# Patient Record
Sex: Male | Born: 1970 | Race: White | Hispanic: Yes | Marital: Single
Health system: Midwestern US, Academic
[De-identification: ages and names within clinical notes are randomized; demographics above are authoritative.]

---

## 2017-03-29 IMAGING — CR CHEST
2 series · 2 of 2 positions shown · non-contrast
Comparison: none

[chest pa x-wise]
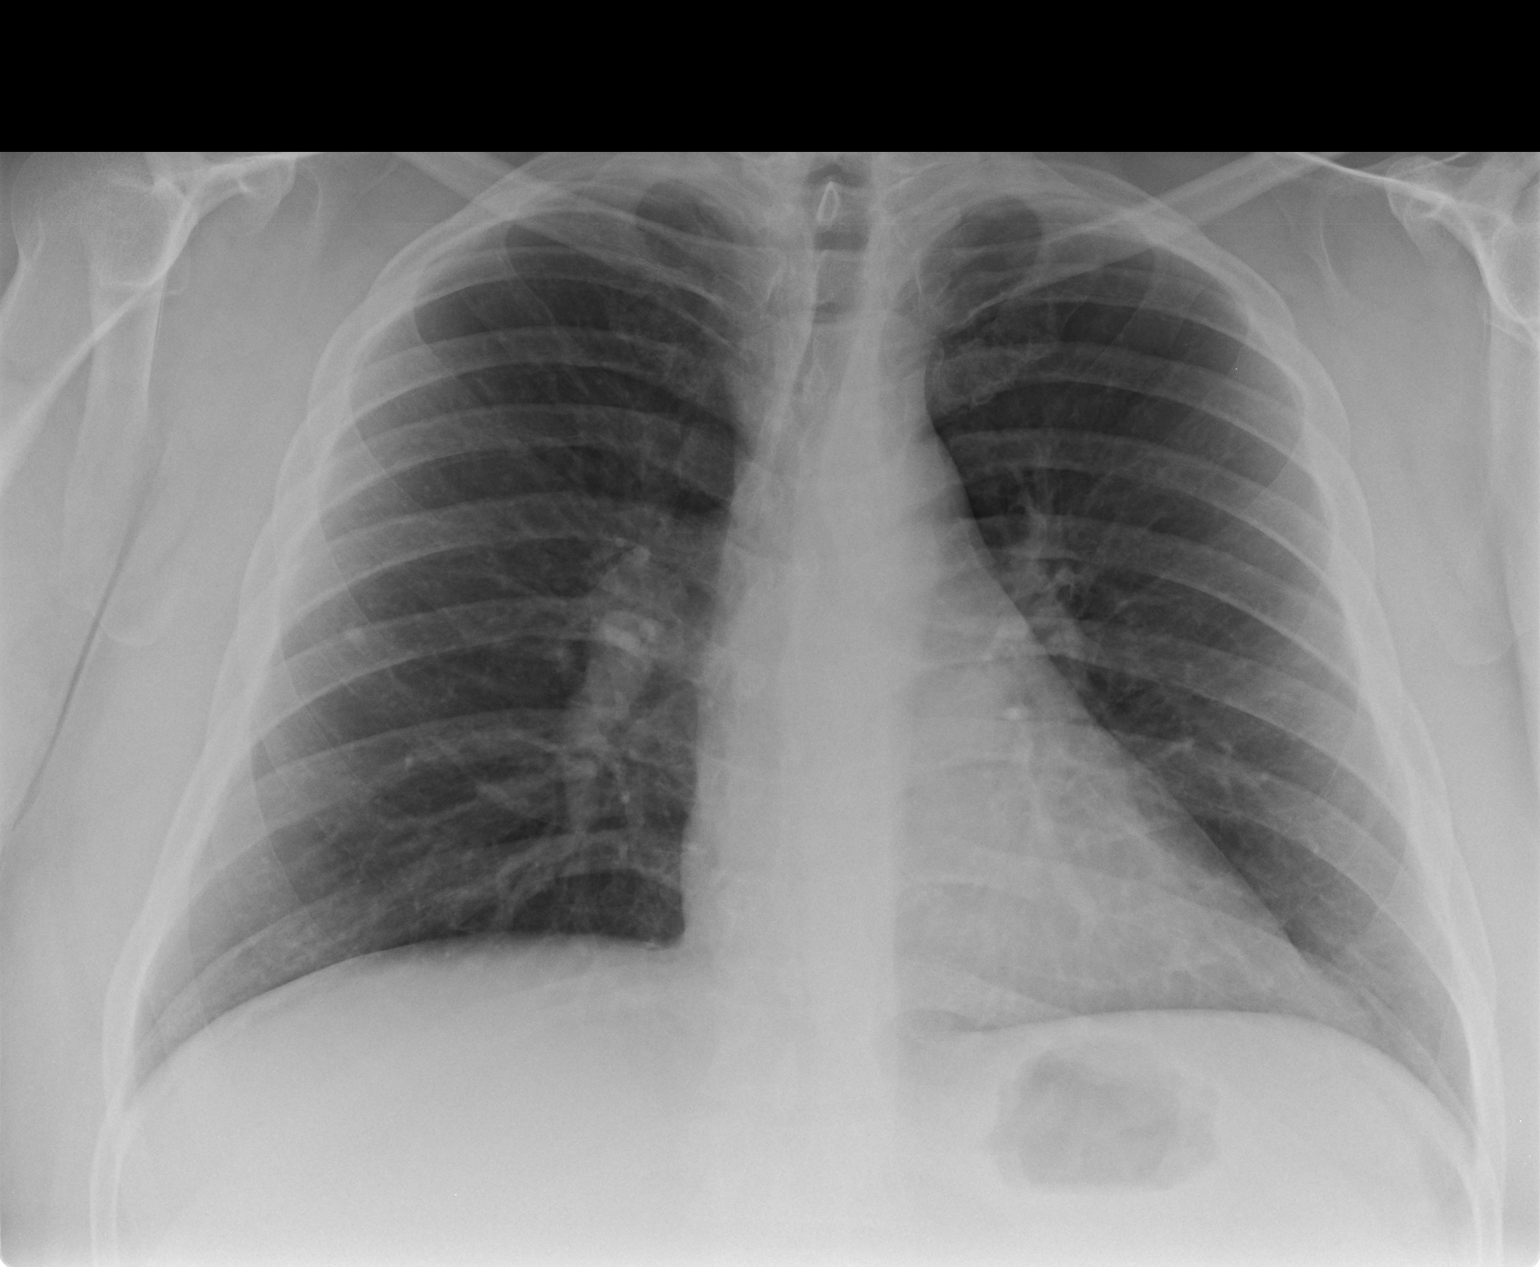

[chest lat]
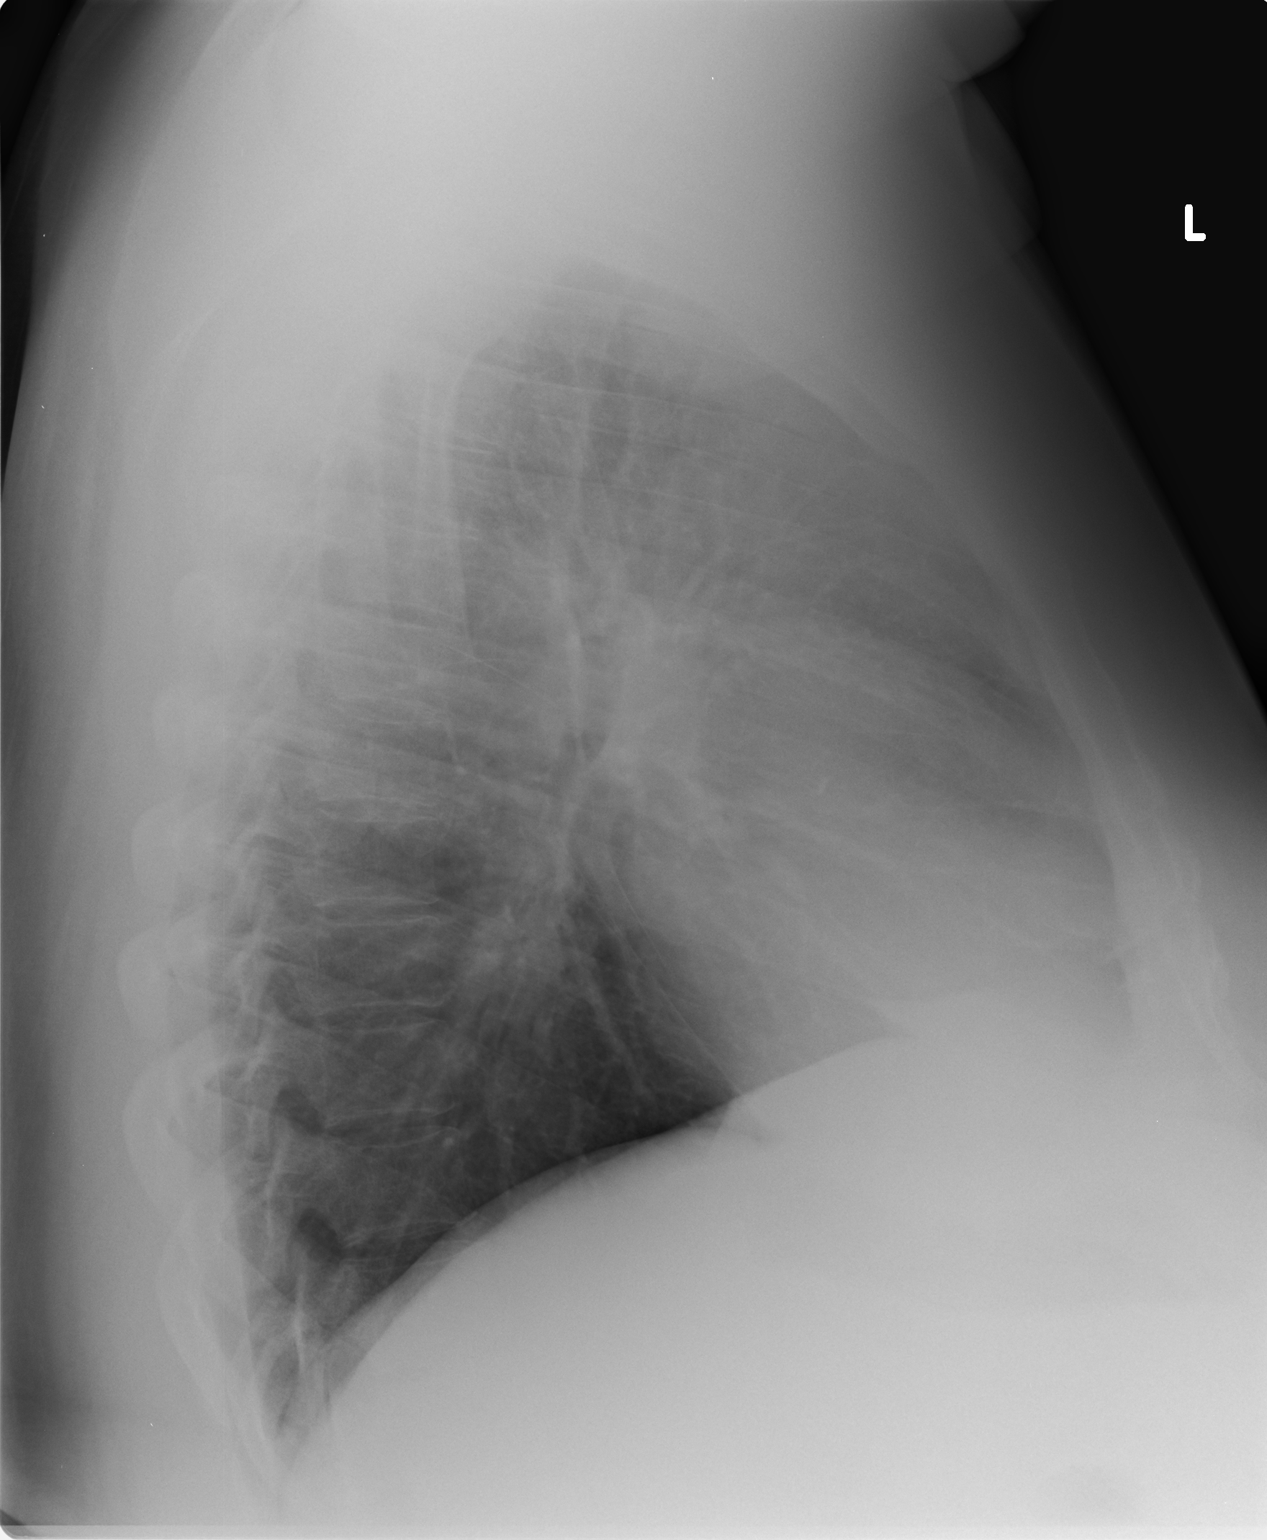

[2 of 2 positions shown; findings below may reference images not displayed]

DIAGNOSTIC STUDIES

EXAM

RADIOLOGICAL EXAMINATION, CHEST; 2 VIEWS FRONTAL AND LATERAL CPT 99383

INDICATION

SOB, wheezing
COUGH. CK

TECHNIQUE

PA and lateral view chest

COMPARISONS

No prior studies are available for comparison.

FINDINGS

There is no focal consolidation, effusion, or pneumothorax. Cardiac silhouette is within normal
limits. The bony thorax is intact.

IMPRESSION

No acute cardiopulmonary abnormality.

## 2017-05-21 IMAGING — CR CHEST
2 series · 2 of 2 positions shown · non-contrast
Comparison: none

[chest pa x-wise]
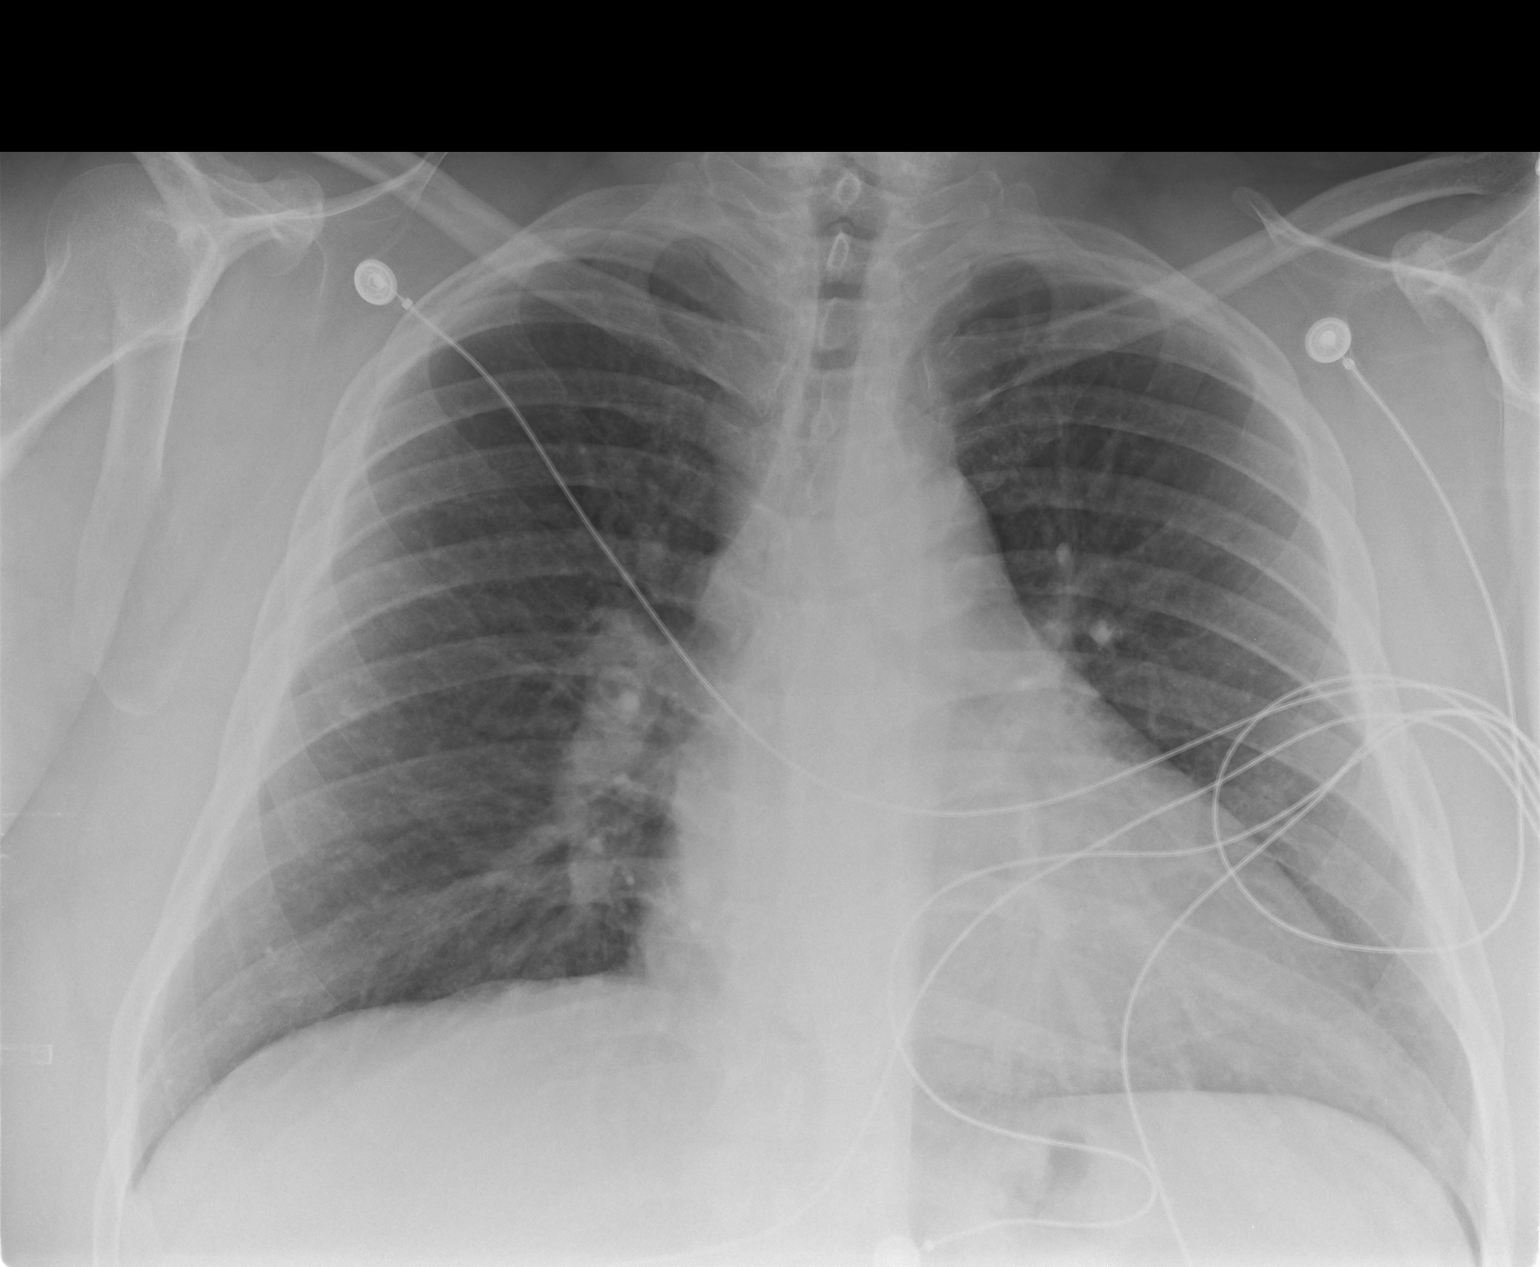

[chest lat]
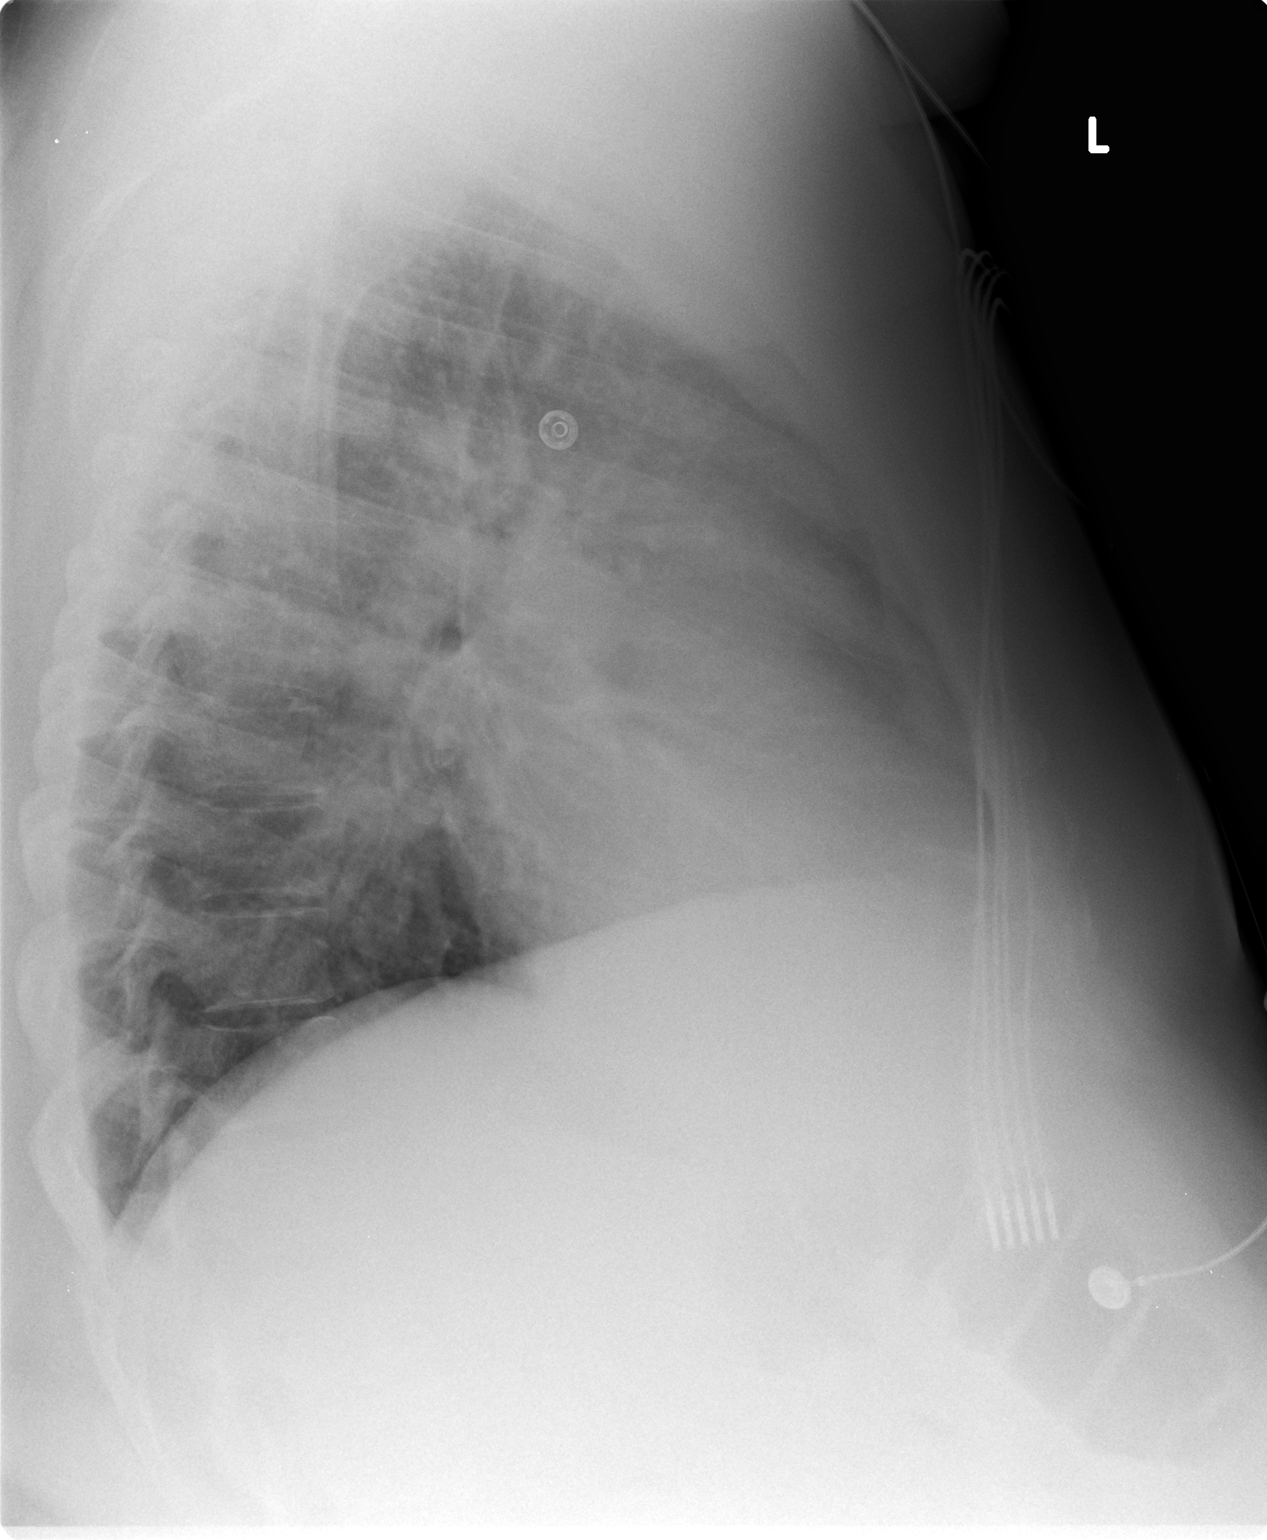

[2 of 2 positions shown; findings below may reference images not displayed]

EXAM
RADIOLOGICAL EXAMINATION, CHEST; 2 VIEWS FRONTAL AND LATERAL CPT 78252

INDICATION
Cough, fever. Mid epigastric pain and nausea. Dry heaves x 2 days.

TECHNIQUE
2 views of the chest were acquired.

COMPARISONS
Previous examination dated 03/29/2017.

FINDINGS
The cardiac silhouette is within normal limits. The lungs are clear. The pulmonary vasculature is
normal in caliber.

IMPRESSION
No acute cardiopulmonary process. Stable appearance of the chest.

Tech Notes:

fever, mid epigastric pain, nausea, dry heaves, x 2 days.

## 2017-05-21 IMAGING — CT Abdomen^1_ABDOMEN_PELVIS_WITH (Adult)
1 series · 15 of 32 positions shown, 19 images · IV contrast (APPLIED)
Comparison: none

[Series 2: abd/pelvis with 5.0 soft tissue · axial · 0.98mm/px · z∈[-480,-55]mm · 15 of 96 slices shown, 19 images]
[im 7/96  soft-tissue]
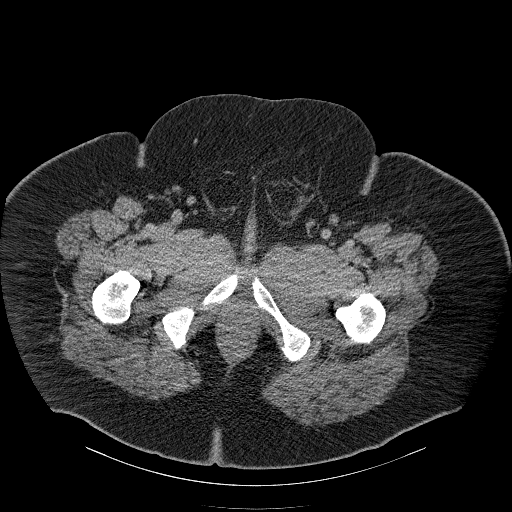
[im 7/96  bone]
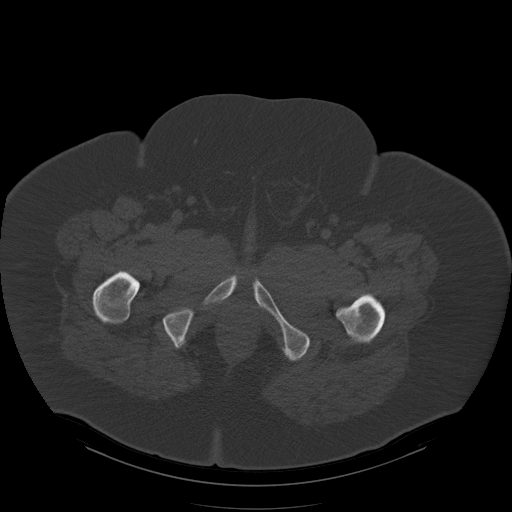
[im 13/96  soft-tissue]
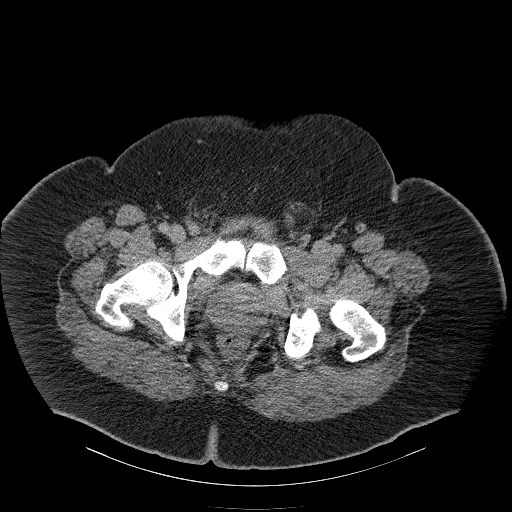
[im 19/96  soft-tissue]
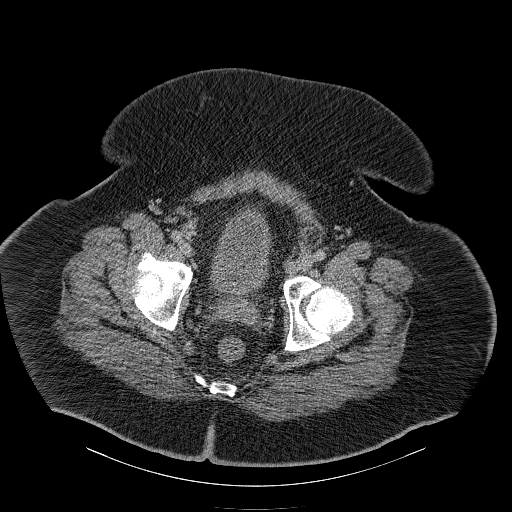
[im 28/96  soft-tissue]
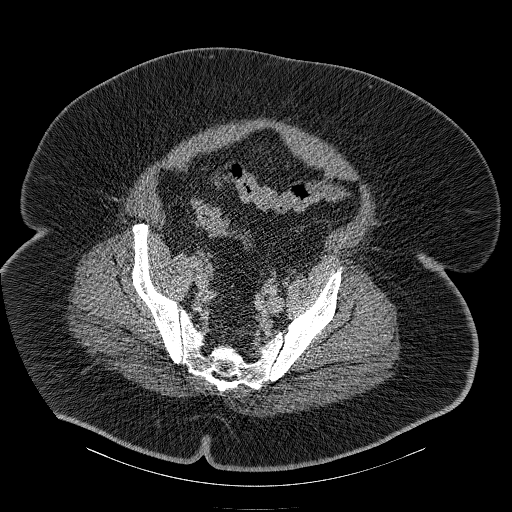
[im 34/96  soft-tissue]
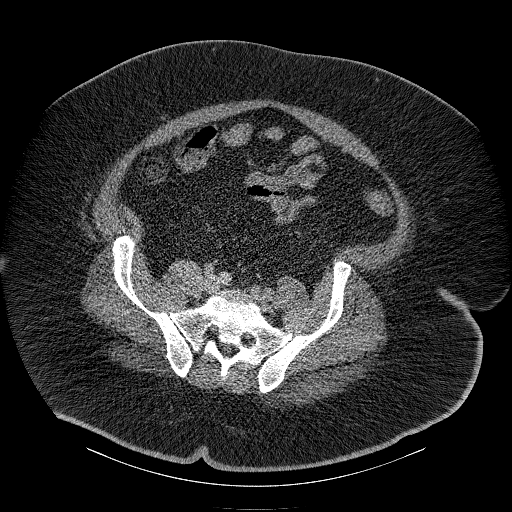
[im 40/96  soft-tissue]
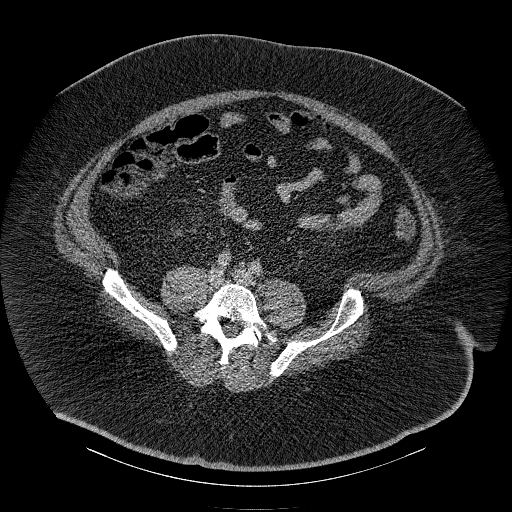
[im 50/96  soft-tissue]
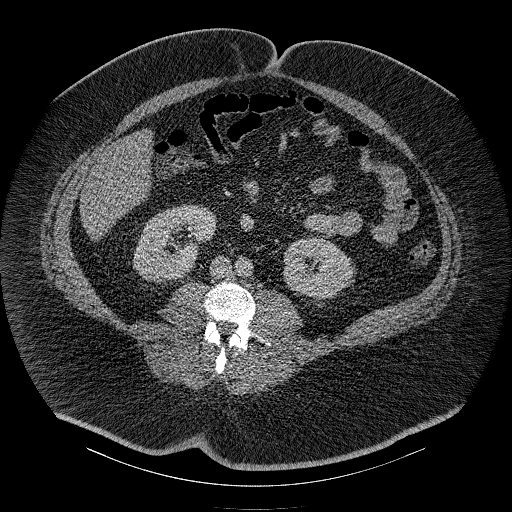
[im 56/96  soft-tissue]
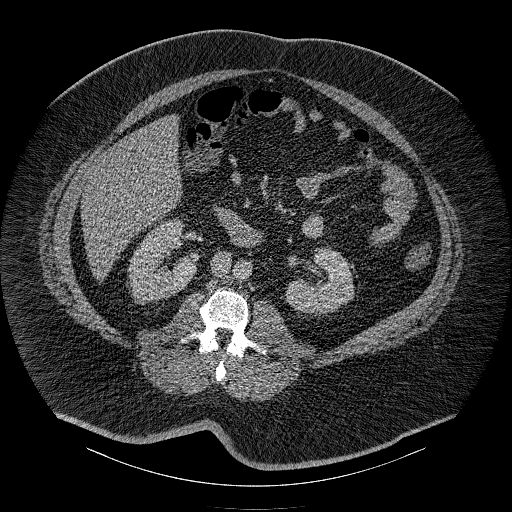
[im 62/96  soft-tissue]
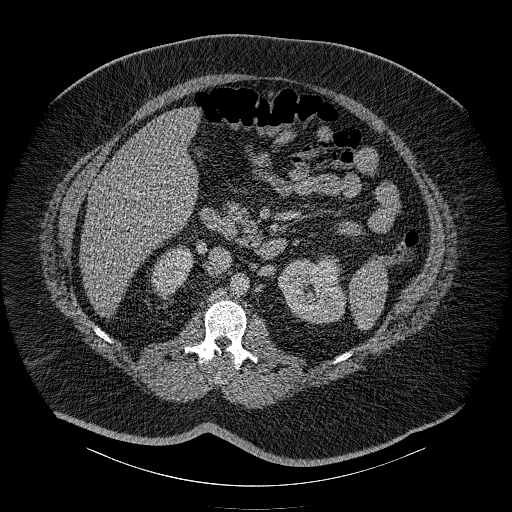
[im 62/96  bone]
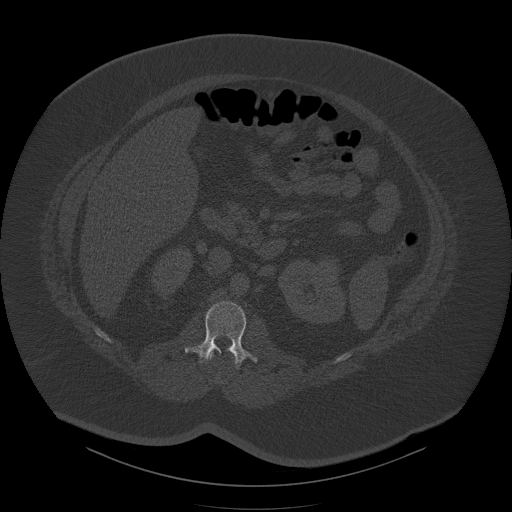
[im 68/96  soft-tissue]
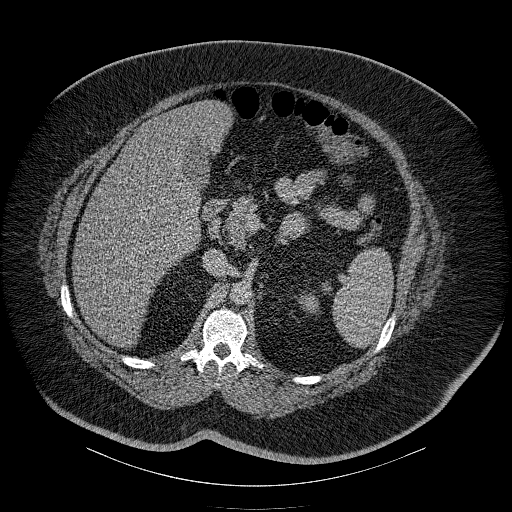
[im 77/96  soft-tissue]
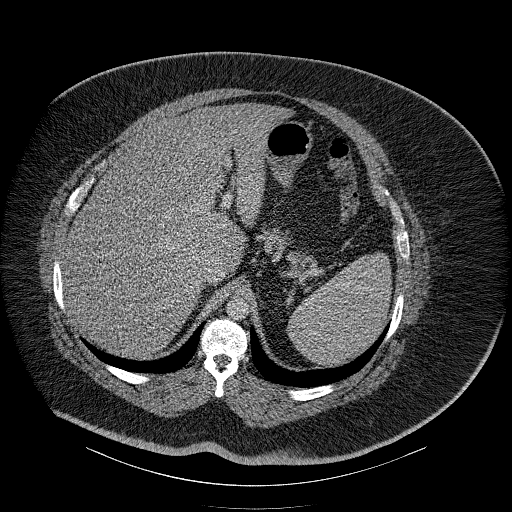
[im 83/96  soft-tissue]
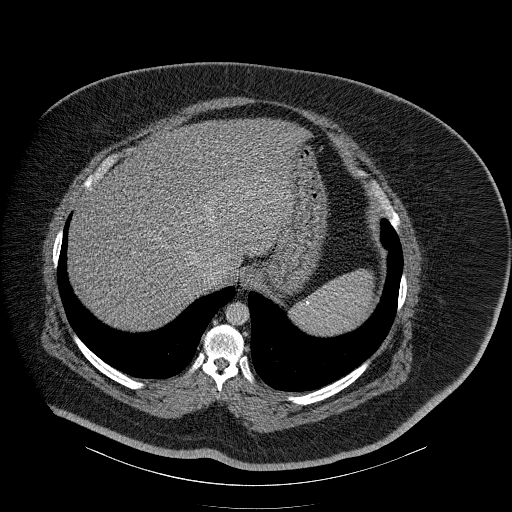
[im 83/96  lung]
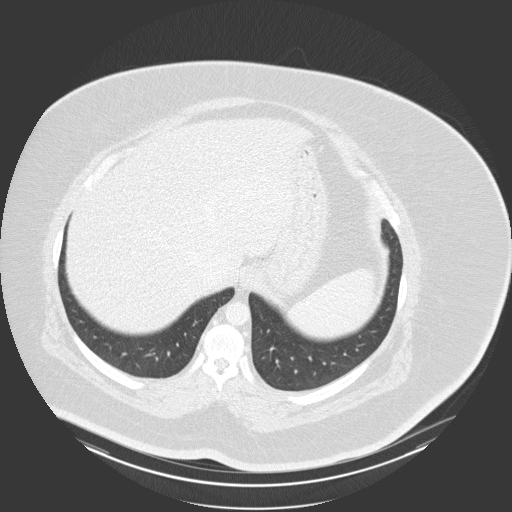
[im 86/96  lung]
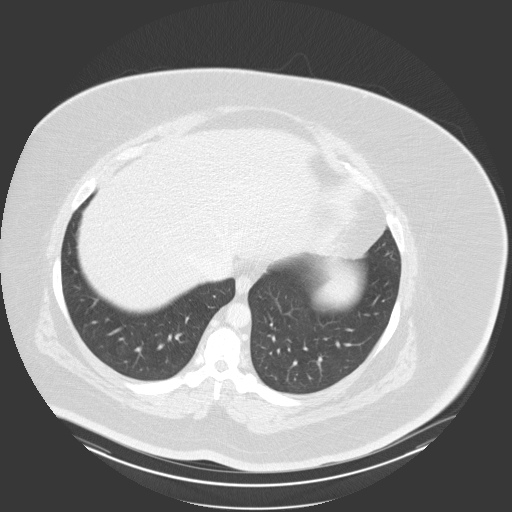
[im 89/96  soft-tissue]
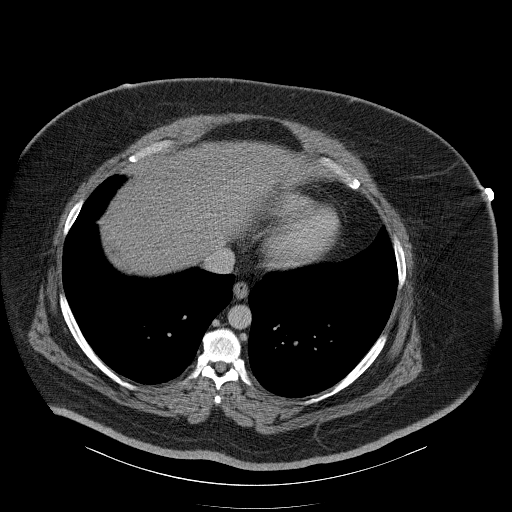
[im 89/96  lung]
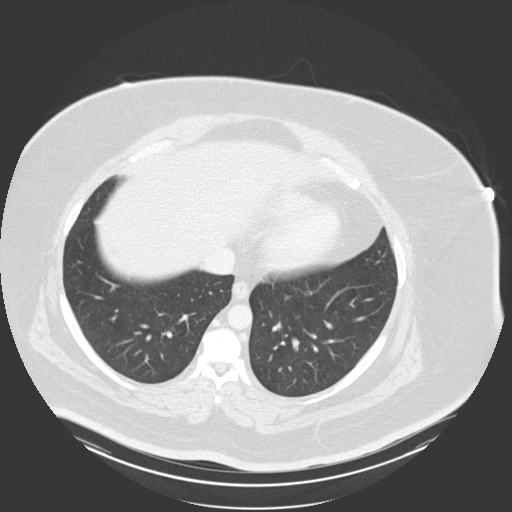
[im 92/96  lung]
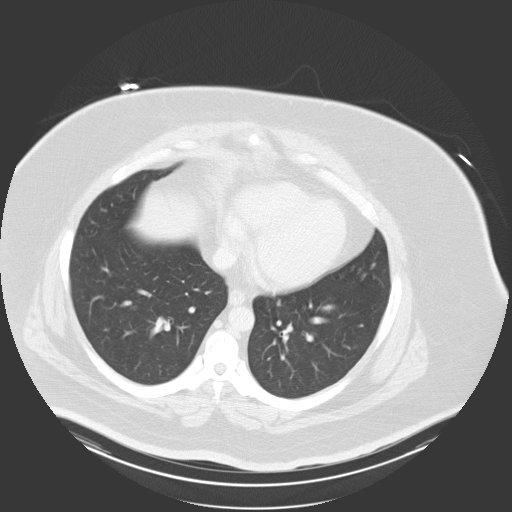

[15 of 32 positions shown; findings below may reference images not displayed]

EXAM
COMPUTED TOMOGRAPHY, ABDOMEN AND PELVIS, WITH CONTRAST MATERIAL CPT 66966

INDICATION
Abdominal pain.

TECHNIQUE
Multiple contiguous transaxial images were obtained through the abdomen and pelvis following oral
and intravenous contrast material. Sagittal and coronal images were generated and evaluated as
well.[100 mL of intravenous low smaller contrast material was injected intravenously.
All CT scans at this facility use dose modulation, iterative reconstruction, and/or weight based
dosing when appropriate to reduce radiation dose to as low as reasonably achievable.

COMPARISONS
There are no previous examinations available for comparison at the time of dictation.

FINDINGS
Lung bases are clear. There are no pleural or pericardial effusions.
Hepatic steatosis is identified. The spleen, pancreas, adrenal glands and kidneys are normal in
shape and morphology. There is no evidence of intra or extrahepatic biliary ductal dilatation. The
gallbladder appears within normal limits.
The aorta and inferior vena cava are normal in caliber and contour. There is no evidence of
epigastric or periportal adenopathy.
There is no evidence of retroperitoneal or pelvic sidewall lymphadenopathy. Visualized loops of
small and large bowel appear normal in caliber and contour. The appendix is not identified. There
are no acute inflammatory changes to suggest appendicitis or diverticulitis
The bony structures are adequately mineralized. There is no evidence of free fluid or free
intraperitoneal air.Prostatic hypertrophy is noted. The seminal vesicles appear within normal
limits.

IMPRESSION
Nonvisualized appendix. No acute abnormalities are identified in the abdomen and pelvis. Hepatic
steatosis. Prostatic hypertrophy.

Tech Notes:
OMNIPAQUE 300

## 2017-05-27 DIAGNOSIS — R509 Fever, unspecified: ICD-10-CM

## 2017-05-27 MED ORDER — ONDANSETRON HCL (PF) 4 MG/2 ML IJ SOLN
4 mg | Freq: Once | INTRAVENOUS | 0 refills | Status: CP
Start: 2017-05-27 — End: ?
  Administered 2017-05-28: 04:00:00 4 mg via INTRAVENOUS

## 2017-05-27 MED ORDER — PIPERACILLIN/TAZOBACTAM 3.375 G/NS IVPB (MB+)
3.375 g | Freq: Once | INTRAVENOUS | 0 refills | Status: CP
Start: 2017-05-27 — End: ?
  Administered 2017-05-28 (×2): 3.375 g via INTRAVENOUS

## 2017-05-27 MED ORDER — INSULIN 100UNITS NS 100ML
5 [IU]/h | INTRAVENOUS | 0 refills | Status: DC
Start: 2017-05-27 — End: 2017-05-28
  Administered 2017-05-28 (×2): 5 [IU]/h via INTRAVENOUS

## 2017-05-27 MED ORDER — LACTATED RINGERS IV SOLP
1000 mL | INTRAVENOUS | 0 refills | Status: CP
Start: 2017-05-27 — End: ?
  Administered 2017-05-28: 03:00:00 1000 mL via INTRAVENOUS

## 2017-05-27 MED ORDER — LACTATED RINGERS IV SOLP
1000 mL | INTRAVENOUS | 0 refills | Status: CP
Start: 2017-05-27 — End: ?
  Administered 2017-05-28: 02:00:00 1000 mL via INTRAVENOUS

## 2017-05-27 MED ORDER — ACETAMINOPHEN 500 MG PO TAB
1000 mg | Freq: Once | ORAL | 0 refills | Status: CP
Start: 2017-05-27 — End: ?
  Administered 2017-05-28: 04:00:00 1000 mg via ORAL

## 2017-05-28 ENCOUNTER — Encounter: Admit: 2017-05-28 | Discharge: 2017-05-28 | Payer: Medicaid Other

## 2017-05-28 ENCOUNTER — Encounter: Admit: 2017-05-28 | Discharge: 2017-05-28 | Payer: MEDICAID

## 2017-05-28 DIAGNOSIS — R509 Fever, unspecified: ICD-10-CM

## 2017-05-28 DIAGNOSIS — G4730 Sleep apnea, unspecified: Principal | ICD-10-CM

## 2017-05-28 DIAGNOSIS — E119 Type 2 diabetes mellitus without complications: ICD-10-CM

## 2017-05-28 DIAGNOSIS — J449 Chronic obstructive pulmonary disease, unspecified: ICD-10-CM

## 2017-05-28 DIAGNOSIS — I10 Essential (primary) hypertension: ICD-10-CM

## 2017-05-28 DIAGNOSIS — J45909 Unspecified asthma, uncomplicated: ICD-10-CM

## 2017-05-28 LAB — COMPREHENSIVE METABOLIC PANEL
Lab: 10 mg/dL (ref 7–25)
Lab: 128 MMOL/L — ABNORMAL LOW (ref 137–147)
Lab: 504 mg/dL — ABNORMAL HIGH (ref 70–100)
Lab: 0.9 mg/dL (ref 0.4–1.24)
Lab: 10 mg/dL (ref 7–25)
Lab: 11 K/UL — ABNORMAL HIGH (ref 3–12)
Lab: 129 MMOL/L — ABNORMAL LOW (ref 137–147)
Lab: 16 U/L — ABNORMAL LOW (ref 7–40)
Lab: 22 MMOL/L (ref 21–30)
Lab: 24 U/L (ref 7–56)
Lab: 339 mg/dL — ABNORMAL HIGH (ref 70–100)
Lab: 4.3 MMOL/L — ABNORMAL LOW (ref 3.5–5.1)
Lab: 86 U/L — ABNORMAL LOW (ref 25–110)
Lab: 9.1 mg/dL (ref 8.5–10.6)
Lab: 96 MMOL/L — ABNORMAL LOW (ref 98–110)

## 2017-05-28 LAB — BLOOD GASES, PERIPHERAL VENOUS
Lab: 2.1 MMOL/L — AB (ref 7–56)
Lab: 21 MMOL/L (ref 1.0–4.8)
Lab: 30 mmHg — ABNORMAL LOW (ref 33–48)
Lab: 46 mmHg — ABNORMAL LOW (ref 36–50)
Lab: 47 % — ABNORMAL LOW (ref 55–71)
Lab: 7.3 U/L — AB (ref 7.30–7.40)

## 2017-05-28 LAB — PHOSPHORUS
Lab: 4.5 mg/dL — ABNORMAL HIGH (ref 2.0–4.5)
Lab: 4.3 mg/dL (ref 2.0–4.5)

## 2017-05-28 LAB — URINALYSIS DIPSTICK: Lab: NEGATIVE mL/min (ref 0–0.80)

## 2017-05-28 LAB — POC GLUCOSE
Lab: 518 mg/dL — ABNORMAL HIGH (ref 70–100)
Lab: 445 mg/dL — ABNORMAL HIGH (ref 70–100)
Lab: 356 mg/dL — ABNORMAL HIGH (ref 70–100)
Lab: 343 mg/dL — ABNORMAL HIGH (ref 70–100)
Lab: 363 mg/dL — ABNORMAL HIGH (ref 70–100)
Lab: 305 mg/dL — ABNORMAL HIGH (ref 70–100)
Lab: 233 mg/dL — ABNORMAL HIGH (ref 70–100)
Lab: 163 mg/dL — ABNORMAL HIGH (ref 70–100)
Lab: 205 mg/dL — ABNORMAL HIGH (ref 70–100)
Lab: 200 mg/dL — ABNORMAL HIGH (ref 70–100)
Lab: 211 mg/dL — ABNORMAL HIGH (ref 70–100)
Lab: 171 mg/dL — ABNORMAL HIGH (ref 70–100)
Lab: 142 mg/dL — ABNORMAL HIGH (ref 70–100)
Lab: 175 mg/dL — ABNORMAL HIGH (ref 70–100)
Lab: 145 mg/dL — ABNORMAL HIGH (ref 70–100)
Lab: 101 mg/dL — ABNORMAL HIGH (ref 70–100)
Lab: 152 mg/dL — ABNORMAL HIGH (ref 70–100)
Lab: 140 mg/dL — ABNORMAL HIGH (ref 70–100)
Lab: 240 mg/dL — ABNORMAL HIGH (ref 70–100)

## 2017-05-28 LAB — LIPID PROFILE
Lab: 115 mg/dL
Lab: 148 mg/dL (ref <200)
Lab: 269 mg/dL — ABNORMAL HIGH (ref <150)
Lab: 33 mg/dL — ABNORMAL LOW (ref >40)
Lab: 54 mg/dL
Lab: 79 mg/dL (ref <100)

## 2017-05-28 LAB — THYROID STIMULATING HORMONE-TSH: Lab: 2.4 uU/mL (ref 0.35–5.00)

## 2017-05-28 LAB — CBC AND DIFF
Lab: 10 10*3/uL (ref 4.5–11.0)
Lab: 14 10*3/uL — ABNORMAL HIGH (ref 4.5–11.0)

## 2017-05-28 LAB — MAGNESIUM
Lab: 1.6 mg/dL (ref 1.6–2.6)
Lab: 1.7 mg/dL — ABNORMAL LOW (ref 1.6–2.6)

## 2017-05-28 LAB — LIPASE: Lab: 22 U/L — ABNORMAL LOW (ref 11–82)

## 2017-05-28 LAB — INFLUENZA A/B AG (RAPID TEST)

## 2017-05-28 LAB — URINALYSIS, MICROSCOPIC

## 2017-05-28 LAB — BASIC METABOLIC PANEL
Lab: 0.7 mg/dL — ABNORMAL HIGH (ref 0.4–1.24)
Lab: 100 MMOL/L — ABNORMAL HIGH (ref 98–110)
Lab: 132 MMOL/L — ABNORMAL LOW (ref 137–147)
Lab: 195 mg/dL — ABNORMAL HIGH (ref 70–100)
Lab: 25 MMOL/L (ref 21–30)
Lab: 7 g/dL (ref 3–12)
Lab: 8 mg/dL — ABNORMAL LOW (ref 7–25)
Lab: 9.1 mg/dL — ABNORMAL HIGH (ref 8.5–10.6)
Lab: >60 mL/min — ABNORMAL HIGH (ref >60)
Lab: >60 mL/min — ABNORMAL LOW (ref >60)

## 2017-05-28 LAB — POC TROPONIN: Lab: 0 ng/mL (ref 0.00–0.05)

## 2017-05-28 LAB — POC LACTATE
Lab: 4.6 MMOL/L — ABNORMAL HIGH (ref 0.5–2.0)
Lab: 3.7 MMOL/L — ABNORMAL HIGH (ref 0.5–2.0)
Lab: 2.2 MMOL/L — ABNORMAL HIGH (ref 0.5–2.0)

## 2017-05-28 LAB — TSH WITH FREE T4 REFLEX: Lab: 2.6 uU/mL (ref 0.35–5.00)

## 2017-05-28 LAB — LACTIC ACID(LACTATE): Lab: 1.5 MMOL/L (ref 0.5–2.0)

## 2017-05-28 LAB — BETA HYDROXYBUTYRATE (KETONES): Lab: 0.1 MMOL/L — ABNORMAL LOW (ref <0.3)

## 2017-05-28 MED ORDER — CITALOPRAM 10 MG PO TAB
20 mg | Freq: Every day | ORAL | 0 refills | Status: DC
Start: 2017-05-28 — End: 2017-05-28
  Administered 2017-05-28: 13:00:00 20 mg via ORAL

## 2017-05-28 MED ORDER — MELATONIN 3 MG PO TAB
3 mg | Freq: Every evening | ORAL | 0 refills | Status: DC
Start: 2017-05-28 — End: 2017-05-31
  Administered 2017-05-28 – 2017-05-31 (×4): 3 mg via ORAL

## 2017-05-28 MED ORDER — ACETAMINOPHEN 325 MG PO TAB
650 mg | ORAL | 0 refills | Status: DC | PRN
Start: 2017-05-28 — End: 2017-06-06
  Administered 2017-05-28 – 2017-06-05 (×7): 650 mg via ORAL

## 2017-05-28 MED ORDER — LEVOFLOXACIN IN D5W 750 MG/150 ML IV PGBK
750 mg | INTRAVENOUS | 0 refills | Status: DC
Start: 2017-05-28 — End: 2017-05-31
  Administered 2017-05-29 – 2017-05-31 (×3): 750 mg via INTRAVENOUS

## 2017-05-28 MED ORDER — ONDANSETRON HCL (PF) 4 MG/2 ML IJ SOLN
4 mg | INTRAVENOUS | 0 refills | Status: DC | PRN
Start: 2017-05-28 — End: 2017-06-06
  Administered 2017-05-31 – 2017-06-01 (×2): 4 mg via INTRAVENOUS

## 2017-05-28 MED ORDER — LEVOFLOXACIN 750 MG PO TAB
750 mg | ORAL | 0 refills | Status: DC
Start: 2017-05-28 — End: 2017-05-29
  Administered 2017-05-28: 10:00:00 750 mg via ORAL

## 2017-05-28 MED ORDER — LINEZOLID 600 MG PO TAB
600 mg | Freq: Two times a day (BID) | ORAL | 0 refills | Status: DC
Start: 2017-05-28 — End: 2017-05-29
  Administered 2017-05-29: 03:00:00 600 mg via ORAL

## 2017-05-28 MED ORDER — GADOBENATE DIMEGLUMINE 529 MG/ML (0.1MMOL/0.2ML) IV SOLN
20 mL | Freq: Once | INTRAVENOUS | 0 refills | Status: CP
Start: 2017-05-28 — End: ?
  Administered 2017-05-29: 03:00:00 20 mL via INTRAVENOUS

## 2017-05-28 MED ORDER — INSULIN 100UNITS NS 100ML
1-32 [IU]/h | INTRAVENOUS | 0 refills | Status: DC
Start: 2017-05-28 — End: 2017-05-28

## 2017-05-28 MED ORDER — TRAMADOL 50 MG PO TAB
50 mg | ORAL | 0 refills | Status: DC | PRN
Start: 2017-05-28 — End: 2017-06-06
  Administered 2017-05-28 – 2017-06-05 (×11): 50 mg via ORAL

## 2017-05-28 MED ORDER — AMPICILLIN 2G/100ML NS IVPB (MB+)
2 g | INTRAVENOUS | 0 refills | Status: DC
Start: 2017-05-28 — End: 2017-05-29
  Administered 2017-05-29 (×6): 2 g via INTRAVENOUS

## 2017-05-28 MED ORDER — CARVEDILOL 12.5 MG PO TAB
12.5 mg | Freq: Two times a day (BID) | ORAL | 0 refills | Status: DC
Start: 2017-05-28 — End: 2017-06-06
  Administered 2017-05-28 – 2017-06-05 (×16): 12.5 mg via ORAL

## 2017-05-28 MED ORDER — POTASSIUM CHLORIDE 20 MEQ PO TBTQ
20 meq | Freq: Once | ORAL | 0 refills | Status: CP
Start: 2017-05-28 — End: ?
  Administered 2017-05-28: 08:00:00 20 meq via ORAL

## 2017-05-28 MED ORDER — LISINOPRIL 20 MG PO TAB
20 mg | Freq: Every day | ORAL | 0 refills | Status: DC
Start: 2017-05-28 — End: 2017-06-06
  Administered 2017-05-28 – 2017-06-05 (×7): 20 mg via ORAL

## 2017-05-28 MED ORDER — IMS MIXTURE TEMPLATE
800 mg | ORAL | 0 refills | Status: DC
Start: 2017-05-28 — End: 2017-06-06
  Administered 2017-05-28 – 2017-06-05 (×49): 800 mg via ORAL

## 2017-05-28 MED ORDER — SODIUM CHLORIDE 0.45 % IV SOLP
1000 mL | INTRAVENOUS | 0 refills | Status: DC
Start: 2017-05-28 — End: 2017-05-29
  Administered 2017-05-28 (×2): 1000 mL via INTRAVENOUS

## 2017-05-28 MED ORDER — PIPERACILLIN/TAZOBACTAM 4.5 G/NS IVPB (MB+)
4.5 g | INTRAVENOUS | 0 refills | Status: DC
Start: 2017-05-28 — End: 2017-05-29

## 2017-05-28 MED ORDER — POLYETHYLENE GLYCOL 3350 17 GRAM PO PWPK
1 | Freq: Every day | ORAL | 0 refills | Status: DC | PRN
Start: 2017-05-28 — End: 2017-05-28

## 2017-05-28 MED ORDER — SODIUM CHLORIDE 0.9 % IJ SOLN
50 mL | Freq: Once | INTRAVENOUS | 0 refills | Status: CP
Start: 2017-05-28 — End: ?
  Administered 2017-05-28: 06:00:00 50 mL via INTRAVENOUS

## 2017-05-28 MED ORDER — IOHEXOL 350 MG IODINE/ML IV SOLN
100 mL | Freq: Once | INTRAVENOUS | 0 refills | Status: CP
Start: 2017-05-28 — End: ?
  Administered 2017-05-28: 06:00:00 100 mL via INTRAVENOUS

## 2017-05-28 MED ORDER — INSULIN 100UNITS NS 100ML
1-32 [IU]/h | INTRAVENOUS | 0 refills | Status: DC
Start: 2017-05-28 — End: 2017-05-28
  Administered 2017-05-28 (×2): 7 [IU]/h via INTRAVENOUS

## 2017-05-28 MED ORDER — METHOCARBAMOL 750 MG PO TAB
750 mg | Freq: Every day | ORAL | 0 refills | Status: DC | PRN
Start: 2017-05-28 — End: 2017-05-30
  Administered 2017-05-28 – 2017-05-29 (×2): 750 mg via ORAL

## 2017-05-28 MED ORDER — POLYETHYLENE GLYCOL 3350 17 GRAM PO PWPK
2 | Freq: Two times a day (BID) | ORAL | 0 refills | Status: DC
Start: 2017-05-28 — End: 2017-05-29
  Administered 2017-05-28: 18:00:00 34 g via ORAL

## 2017-05-28 MED ORDER — SODIUM CHLORIDE 0.9 % IJ SOLN
50 mL | Freq: Once | INTRAVENOUS | 0 refills | Status: AC
Start: 2017-05-28 — End: ?

## 2017-05-28 MED ORDER — INSULIN 100UNITS NS 100ML
1-32 [IU]/h | INTRAVENOUS | 0 refills | Status: AC
Start: 2017-05-28 — End: ?
  Administered 2017-05-29: 19:00:00 8.5 [IU]/h via INTRAVENOUS
  Administered 2017-05-29: 15:00:00 6 [IU]/h via INTRAVENOUS
  Administered 2017-05-29: 06:00:00 2.5 [IU]/h via INTRAVENOUS
  Administered 2017-05-29: 17:00:00 7 [IU]/h via INTRAVENOUS
  Administered 2017-05-29: 15:00:00 6 [IU]/h via INTRAVENOUS
  Administered 2017-05-29: 17:00:00 7 [IU]/h via INTRAVENOUS
  Administered 2017-05-29: 23:00:00 6.5 [IU]/h via INTRAVENOUS
  Administered 2017-05-29: 06:00:00 2.5 [IU]/h via INTRAVENOUS
  Administered 2017-05-29: 23:00:00 6.5 [IU]/h via INTRAVENOUS
  Administered 2017-05-29: 19:00:00 8.5 [IU]/h via INTRAVENOUS
  Administered 2017-05-30 (×2): 6.5 [IU]/h via INTRAVENOUS

## 2017-05-28 MED ORDER — FLUTICASONE-SALMETEROL 250-50 MCG/DOSE IN DSDV
1 | Freq: Two times a day (BID) | RESPIRATORY_TRACT | 0 refills | Status: DC
Start: 2017-05-28 — End: 2017-06-06
  Administered 2017-05-28 – 2017-05-29 (×2): 1 via RESPIRATORY_TRACT

## 2017-05-28 MED ORDER — PIPERACILLIN/TAZOBACTAM 3.375 G/NS IVPB (MB+)
3.375 g | INTRAVENOUS | 0 refills | Status: DC
Start: 2017-05-28 — End: 2017-05-29

## 2017-05-28 MED ORDER — TAMSULOSIN 0.4 MG PO CAP
0.4 mg | Freq: Every day | ORAL | 0 refills | Status: DC
Start: 2017-05-28 — End: 2017-05-29
  Administered 2017-05-28: 06:00:00 0.4 mg via ORAL

## 2017-05-28 MED ORDER — INSULIN ASPART 100 UNIT/ML SC FLEXPEN
15 [IU] | Freq: Three times a day (TID) | SUBCUTANEOUS | 0 refills | Status: DC
Start: 2017-05-28 — End: 2017-05-29
  Administered 2017-05-28: 23:00:00 15 [IU] via SUBCUTANEOUS

## 2017-05-28 MED ORDER — ENOXAPARIN 60 MG/0.6 ML SC SYRG
60 mg | Freq: Two times a day (BID) | SUBCUTANEOUS | 0 refills | Status: DC
Start: 2017-05-28 — End: 2017-06-01
  Administered 2017-05-28 – 2017-05-31 (×7): 60 mg via SUBCUTANEOUS

## 2017-05-28 MED ORDER — POLYETHYLENE GLYCOL 3350 17 GRAM PO PWPK
2 | Freq: Every day | ORAL | 0 refills | Status: DC | PRN
Start: 2017-05-28 — End: 2017-06-06

## 2017-05-29 LAB — POC GLUCOSE
Lab: 196 mg/dL — ABNORMAL HIGH (ref 70–100)
Lab: 170 mg/dL — ABNORMAL HIGH (ref 70–100)
Lab: 239 mg/dL — ABNORMAL HIGH (ref 70–100)
Lab: 273 mg/dL — ABNORMAL HIGH (ref 70–100)
Lab: 274 mg/dL — ABNORMAL HIGH (ref 70–100)
Lab: 267 mg/dL — ABNORMAL HIGH (ref 70–100)
Lab: 227 mg/dL — ABNORMAL HIGH (ref 70–100)
Lab: 222 mg/dL — ABNORMAL HIGH (ref 70–100)
Lab: 198 mg/dL — ABNORMAL HIGH (ref 70–100)
Lab: 160 mg/dL — ABNORMAL HIGH (ref 70–100)
Lab: 98 mg/dL (ref 70–100)
Lab: 230 mg/dL — ABNORMAL HIGH (ref 70–100)
Lab: 223 mg/dL — ABNORMAL HIGH (ref 70–100)
Lab: 202 mg/dL — ABNORMAL HIGH (ref 70–100)
Lab: 202 mg/dL — ABNORMAL HIGH (ref 70–100)
Lab: 140 mg/dL — ABNORMAL HIGH (ref 70–100)
Lab: 133 mg/dL — ABNORMAL HIGH (ref 70–100)
Lab: 182 mg/dL — ABNORMAL HIGH (ref 70–100)

## 2017-05-29 LAB — RVP VIRAL PANEL PCR

## 2017-05-29 LAB — CBC AND DIFF: Lab: 10 K/UL — ABNORMAL HIGH (ref 4.5–11.0)

## 2017-05-29 LAB — COMPREHENSIVE METABOLIC PANEL: Lab: 132 MMOL/L — ABNORMAL LOW (ref 137–147)

## 2017-05-29 LAB — CHLAM/NG PCR URINE: Lab: NEGATIVE

## 2017-05-29 MED ORDER — DAPTOMYCIN SYR
6 mg/kg | INTRAVENOUS | 0 refills | Status: DC
Start: 2017-05-29 — End: 2017-06-01
  Administered 2017-05-29 – 2017-05-31 (×3): 600 mg via INTRAVENOUS

## 2017-05-29 MED ORDER — NPH INSULIN HUMAN RECOMB 100 UNIT/ML (3 ML) SC PEN
36 [IU] | Freq: Every day | SUBCUTANEOUS | 0 refills | Status: DC
Start: 2017-05-29 — End: 2017-05-29

## 2017-05-29 MED ORDER — BISACODYL 10 MG RE SUPP
10 mg | Freq: Once | RECTAL | 0 refills | Status: CP
Start: 2017-05-29 — End: ?
  Administered 2017-05-30: 05:00:00 10 mg via RECTAL

## 2017-05-29 MED ORDER — TAMSULOSIN 0.4 MG PO CAP
0.4 mg | Freq: Two times a day (BID) | ORAL | 0 refills | Status: DC
Start: 2017-05-29 — End: 2017-06-06
  Administered 2017-05-29 – 2017-06-05 (×15): 0.4 mg via ORAL

## 2017-05-29 MED ORDER — INSULIN ASPART 100 UNIT/ML SC FLEXPEN
0-12 [IU] | Freq: Every day | SUBCUTANEOUS | 0 refills | Status: DC
Start: 2017-05-29 — End: 2017-06-06

## 2017-05-29 MED ORDER — NPH INSULIN HUMAN RECOMB 100 UNIT/ML (3 ML) SC PEN
36 [IU] | Freq: Two times a day (BID) | SUBCUTANEOUS | 0 refills | Status: DC
Start: 2017-05-29 — End: 2017-05-30
  Administered 2017-05-29: 19:00:00 36 [IU] via SUBCUTANEOUS

## 2017-05-29 MED ORDER — INSULIN ASPART 100 UNIT/ML SC FLEXPEN
20 [IU] | Freq: Three times a day (TID) | SUBCUTANEOUS | 0 refills | Status: DC
Start: 2017-05-29 — End: 2017-05-30

## 2017-05-29 MED ORDER — INSULIN ASPART 100 UNIT/ML SC FLEXPEN
0-12 [IU] | Freq: Before meals | SUBCUTANEOUS | 0 refills | Status: DC
Start: 2017-05-29 — End: 2017-05-29

## 2017-05-30 LAB — CULTURE-URINE W/SENSITIVITY

## 2017-05-30 LAB — POC GLUCOSE
Lab: 126 mg/dL — ABNORMAL HIGH (ref 70–100)
Lab: 150 mg/dL — ABNORMAL HIGH (ref 70–100)
Lab: 155 mg/dL — ABNORMAL HIGH (ref 70–100)
Lab: 164 mg/dL — ABNORMAL HIGH (ref 70–100)
Lab: 195 mg/dL — ABNORMAL HIGH (ref 70–100)
Lab: 235 mg/dL — ABNORMAL HIGH (ref 70–100)
Lab: 245 mg/dL — ABNORMAL HIGH (ref 70–100)
Lab: 254 mg/dL — ABNORMAL HIGH (ref 70–100)

## 2017-05-30 LAB — COMPREHENSIVE METABOLIC PANEL: Lab: 132 MMOL/L — ABNORMAL LOW (ref 137–147)

## 2017-05-30 LAB — CBC AND DIFF: Lab: 9.2 K/UL — ABNORMAL HIGH (ref >60)

## 2017-05-30 LAB — CREATINE KINASE-CPK: Lab: 51 U/L — ABNORMAL LOW (ref >60)

## 2017-05-30 LAB — HEMOGLOBIN A1C: Lab: 11 % — ABNORMAL HIGH (ref 4.0–6.0)

## 2017-05-30 MED ORDER — DOCUSATE SODIUM 100 MG PO CAP
100 mg | Freq: Once | ORAL | 0 refills | Status: CP
Start: 2017-05-30 — End: ?
  Administered 2017-05-31: 05:00:00 100 mg via ORAL

## 2017-05-30 MED ORDER — INSULIN ASPART 100 UNIT/ML SC FLEXPEN
24 [IU] | Freq: Three times a day (TID) | SUBCUTANEOUS | 0 refills | Status: DC
Start: 2017-05-30 — End: 2017-06-01

## 2017-05-30 MED ORDER — KETOROLAC 15 MG/ML IJ SOLN
15 mg | INTRAVENOUS | 0 refills | Status: DC | PRN
Start: 2017-05-30 — End: 2017-06-03
  Administered 2017-05-30 – 2017-06-03 (×8): 15 mg via INTRAVENOUS

## 2017-05-30 MED ORDER — MELATONIN 5 MG PO TAB
10 mg | Freq: Every evening | ORAL | 0 refills | Status: DC
Start: 2017-05-30 — End: 2017-06-06
  Administered 2017-05-31 – 2017-06-05 (×6): 10 mg via ORAL

## 2017-05-30 MED ORDER — NPH INSULIN HUMAN RECOMB 100 UNIT/ML (3 ML) SC PEN
42 [IU] | Freq: Two times a day (BID) | SUBCUTANEOUS | 0 refills | Status: DC
Start: 2017-05-30 — End: 2017-06-01

## 2017-05-30 MED ORDER — METHOCARBAMOL 750 MG PO TAB
750 mg | Freq: Three times a day (TID) | ORAL | 0 refills | Status: DC | PRN
Start: 2017-05-30 — End: 2017-06-06
  Administered 2017-05-30 – 2017-06-05 (×15): 750 mg via ORAL

## 2017-05-31 ENCOUNTER — Encounter: Admit: 2017-05-31 | Discharge: 2017-05-31 | Payer: MEDICAID

## 2017-05-31 DIAGNOSIS — N412 Abscess of prostate: Principal | ICD-10-CM

## 2017-05-31 LAB — CULTURE-RESP,UPPER-THROAT

## 2017-05-31 LAB — CBC AND DIFF: Lab: 8.8 K/UL — ABNORMAL LOW (ref >60)

## 2017-05-31 LAB — POC GLUCOSE
Lab: 152 mg/dL — ABNORMAL HIGH (ref 70–100)
Lab: 142 mg/dL — ABNORMAL HIGH (ref 70–100)
Lab: 123 mg/dL — ABNORMAL HIGH (ref 70–100)
Lab: 118 mg/dL — ABNORMAL HIGH (ref 70–100)
Lab: 106 mg/dL — ABNORMAL HIGH (ref 70–100)

## 2017-05-31 LAB — COMPREHENSIVE METABOLIC PANEL: Lab: 133 MMOL/L — ABNORMAL LOW (ref 137–147)

## 2017-05-31 MED ORDER — ACETAMINOPHEN/LIDOCAINE/ANTACID DS(#) 1:1:3  PO SUSP
30 mL | Freq: Once | ORAL | 0 refills | Status: CP
Start: 2017-05-31 — End: ?
  Administered 2017-05-31: 13:00:00 30 mL via ORAL

## 2017-05-31 MED ORDER — TRAZODONE 50 MG PO TAB
50 mg | Freq: Every evening | ORAL | 0 refills | Status: DC | PRN
Start: 2017-05-31 — End: 2017-06-06
  Administered 2017-06-01: 03:00:00 50 mg via ORAL

## 2017-05-31 MED ORDER — ACETAMINOPHEN/LIDOCAINE/ANTACID DS(#) 1:1:3  PO SUSP
30 mL | Freq: Three times a day (TID) | ORAL | 0 refills | Status: DC | PRN
Start: 2017-05-31 — End: 2017-06-06

## 2017-05-31 MED ORDER — METFORMIN 500 MG PO TAB
500 mg | Freq: Two times a day (BID) | ORAL | 0 refills | Status: DC
Start: 2017-05-31 — End: 2017-06-02
  Administered 2017-05-31 – 2017-06-02 (×4): 500 mg via ORAL

## 2017-06-01 ENCOUNTER — Inpatient Hospital Stay: Admit: 2017-06-01 | Discharge: 2017-06-01 | Payer: MEDICAID

## 2017-06-01 ENCOUNTER — Encounter: Admit: 2017-06-01 | Discharge: 2017-06-01 | Payer: Medicaid Other

## 2017-06-01 ENCOUNTER — Encounter: Admit: 2017-06-01 | Discharge: 2017-06-01 | Payer: MEDICAID

## 2017-06-01 DIAGNOSIS — G4730 Sleep apnea, unspecified: Principal | ICD-10-CM

## 2017-06-01 DIAGNOSIS — J45909 Unspecified asthma, uncomplicated: ICD-10-CM

## 2017-06-01 DIAGNOSIS — I10 Essential (primary) hypertension: ICD-10-CM

## 2017-06-01 DIAGNOSIS — J449 Chronic obstructive pulmonary disease, unspecified: ICD-10-CM

## 2017-06-01 DIAGNOSIS — E119 Type 2 diabetes mellitus without complications: ICD-10-CM

## 2017-06-01 LAB — POC GLUCOSE
Lab: 89 mg/dL (ref 70–100)
Lab: 79 mg/dL (ref 70–100)
Lab: 103 mg/dL — ABNORMAL HIGH (ref 70–100)
Lab: 155 mg/dL — ABNORMAL HIGH (ref 70–100)
Lab: 167 mg/dL — ABNORMAL HIGH (ref 70–100)
Lab: 148 mg/dL — ABNORMAL HIGH (ref 70–100)
Lab: 152 mg/dL — ABNORMAL HIGH (ref >60)
Lab: 142 mg/dL — ABNORMAL HIGH (ref 70–100)

## 2017-06-01 LAB — TROPONIN-I: Lab: 0 ng/mL (ref 0.0–0.05)

## 2017-06-01 LAB — COMPREHENSIVE METABOLIC PANEL: Lab: 137 MMOL/L — ABNORMAL LOW (ref >60)

## 2017-06-01 LAB — CBC AND DIFF: Lab: 8.4 K/UL — ABNORMAL HIGH (ref 4.5–11.0)

## 2017-06-01 MED ORDER — FENTANYL CITRATE (PF) 50 MCG/ML IJ SOLN
0 refills | Status: DC
Start: 2017-06-01 — End: 2017-06-01
  Administered 2017-06-01: 18:00:00 25 ug via INTRAVENOUS
  Administered 2017-06-01: 16:00:00 50 ug via INTRAVENOUS
  Administered 2017-06-01 (×3): 25 ug via INTRAVENOUS
  Administered 2017-06-01: 16:00:00 50 ug via INTRAVENOUS

## 2017-06-01 MED ORDER — KETAMINE 10 MG/ML IJ SOLN
0 refills | Status: DC
Start: 2017-06-01 — End: 2017-06-01
  Administered 2017-06-01: 19:00:00 30 mg via INTRAVENOUS

## 2017-06-01 MED ORDER — LIDOCAINE (PF) 200 MG/10 ML (2 %) IJ SYRG
0 refills | Status: DC
Start: 2017-06-01 — End: 2017-06-01
  Administered 2017-06-01: 16:00:00 100 mg via INTRAVENOUS

## 2017-06-01 MED ORDER — ACETAMINOPHEN 500 MG PO TAB
1000 mg | Freq: Once | ORAL | 0 refills | Status: CP
Start: 2017-06-01 — End: ?
  Administered 2017-06-01: 19:00:00 1000 mg via ORAL

## 2017-06-01 MED ORDER — LACTATED RINGERS IV SOLP
1000 mL | INTRAVENOUS | 0 refills | Status: AC
Start: 2017-06-01 — End: ?
  Administered 2017-06-01: 16:00:00 1000 mL via INTRAVENOUS
  Administered 2017-06-01: 15:00:00 1000.000 mL via INTRAVENOUS

## 2017-06-01 MED ORDER — HALOPERIDOL LACTATE 5 MG/ML IJ SOLN
1 mg | Freq: Once | INTRAVENOUS | 0 refills | Status: CP | PRN
Start: 2017-06-01 — End: ?
  Administered 2017-06-01: 19:00:00 1 mg via INTRAVENOUS

## 2017-06-01 MED ORDER — MIDAZOLAM 1 MG/ML IJ SOLN
INTRAVENOUS | 0 refills | Status: DC
Start: 2017-06-01 — End: 2017-06-01
  Administered 2017-06-01 (×2): 1 mg via INTRAVENOUS

## 2017-06-01 MED ORDER — FENTANYL CITRATE (PF) 50 MCG/ML IJ SOLN
50 ug | INTRAVENOUS | 0 refills | Status: DC | PRN
Start: 2017-06-01 — End: 2017-06-01
  Administered 2017-06-01 (×4): 50 ug via INTRAVENOUS

## 2017-06-01 MED ORDER — PROMETHAZINE 25 MG/ML IJ SOLN
6.25 mg | INTRAVENOUS | 0 refills | Status: DC | PRN
Start: 2017-06-01 — End: 2017-06-01

## 2017-06-01 MED ORDER — GADOBENATE DIMEGLUMINE 529 MG/ML (0.1MMOL/0.2ML) IV SOLN
20 mL | Freq: Once | INTRAVENOUS | 0 refills | Status: CP
Start: 2017-06-01 — End: ?
  Administered 2017-06-02: 05:00:00 20 mL via INTRAVENOUS

## 2017-06-01 MED ORDER — INSULIN ASPART 100 UNIT/ML SC FLEXPEN
20 [IU] | Freq: Three times a day (TID) | SUBCUTANEOUS | 0 refills | Status: DC
Start: 2017-06-01 — End: 2017-06-05

## 2017-06-01 MED ORDER — SODIUM CHLORIDE 0.9% IRRIGATION BAG
0 refills | Status: DC
Start: 2017-06-01 — End: 2017-06-01
  Administered 2017-06-01: 17:00:00 12000 mL

## 2017-06-01 MED ORDER — PROPOFOL INJ 10 MG/ML IV VIAL
0 refills | Status: DC
Start: 2017-06-01 — End: 2017-06-01
  Administered 2017-06-01: 16:00:00 200 mg via INTRAVENOUS

## 2017-06-01 MED ORDER — BELLADONNA ALKALOIDS-OPIUM 16.2-60 MG RE SUPP
0 refills | Status: DC
Start: 2017-06-01 — End: 2017-06-01
  Administered 2017-06-01: 17:00:00 1 via RECTAL

## 2017-06-01 MED ORDER — NPH INSULIN HUMAN RECOMB 100 UNIT/ML (3 ML) SC PEN
36 [IU] | Freq: Two times a day (BID) | SUBCUTANEOUS | 0 refills | Status: DC
Start: 2017-06-01 — End: 2017-06-02
  Administered 2017-06-01: 15:00:00 36 [IU] via SUBCUTANEOUS

## 2017-06-01 MED ORDER — PHENYLEPHRINE IN 0.9% NACL(PF) 1 MG/10 ML (100 MCG/ML) IV SYRG
INTRAVENOUS | 0 refills | Status: DC
Start: 2017-06-01 — End: 2017-06-01
  Administered 2017-06-01 (×9): 100 ug via INTRAVENOUS

## 2017-06-01 MED ORDER — PHENYLEPHRINE IV DRIP (STD CONC)
0 refills | Status: DC
Start: 2017-06-01 — End: 2017-06-01
  Administered 2017-06-01 (×2): 0.5 ug/kg/min via INTRAVENOUS

## 2017-06-01 MED ORDER — SUCCINYLCHOLINE CHLORIDE 20 MG/ML IJ SOLN
INTRAVENOUS | 0 refills | Status: DC
Start: 2017-06-01 — End: 2017-06-01
  Administered 2017-06-01: 16:00:00 200 mg via INTRAVENOUS

## 2017-06-01 MED ORDER — HYOSCYAMINE SULFATE 0.125 MG PO TBDI
.125 mg | SUBLINGUAL | 0 refills | Status: DC | PRN
Start: 2017-06-01 — End: 2017-06-06
  Administered 2017-06-01 – 2017-06-02 (×2): 0.125 mg via SUBLINGUAL

## 2017-06-01 MED ORDER — HYDROMORPHONE (PF) 2 MG/ML IJ SYRG
.5-1 mg | INTRAVENOUS | 0 refills | Status: DC | PRN
Start: 2017-06-01 — End: 2017-06-01

## 2017-06-01 MED ORDER — EPHEDRINE SULFATE 50 MG/5ML SYR (10 MG/ML) (AN)(OSM)
0 refills | Status: DC
Start: 2017-06-01 — End: 2017-06-01
  Administered 2017-06-01 (×2): 5 mg via INTRAVENOUS

## 2017-06-01 MED ORDER — OXYBUTYNIN CHLORIDE 5 MG PO TAB
5 mg | Freq: Three times a day (TID) | ORAL | 0 refills | Status: DC
Start: 2017-06-01 — End: 2017-06-06
  Administered 2017-06-01 – 2017-06-05 (×13): 5 mg via ORAL

## 2017-06-01 MED ORDER — AMPICILLIN 2G/100ML NS IVPB (MB+)
2 g | INTRAVENOUS | 0 refills | Status: DC
Start: 2017-06-01 — End: 2017-06-03
  Administered 2017-06-01 – 2017-06-03 (×22): 2 g via INTRAVENOUS

## 2017-06-01 MED ORDER — LIDOCAINE HCL 2 % MM JELP
0 refills | Status: DC
Start: 2017-06-01 — End: 2017-06-01

## 2017-06-01 MED ORDER — SODIUM CHLORIDE 0.9% IRRIGATION BAG
3000 mL | 0 refills | Status: DC
Start: 2017-06-01 — End: 2017-06-03
  Administered 2017-06-01 – 2017-06-03 (×14): 3000 mL

## 2017-06-01 MED ADMIN — ALBUTEROL SULFATE 2.5 MG /3 ML (0.083 %) IN NEBU [250]: 2.5 mg | RESPIRATORY_TRACT | @ 21:00:00 | Stop: 2017-06-01 | NDC 00487950101

## 2017-06-02 ENCOUNTER — Inpatient Hospital Stay: Admit: 2017-06-02 | Discharge: 2017-06-02 | Payer: Medicaid Other

## 2017-06-02 DIAGNOSIS — R509 Fever, unspecified: ICD-10-CM

## 2017-06-02 LAB — CULTURE-BLOOD W/SENSITIVITY: Lab: POSITIVE — AB

## 2017-06-02 LAB — COMPREHENSIVE METABOLIC PANEL: Lab: 137 MMOL/L — ABNORMAL LOW (ref 137–147)

## 2017-06-02 LAB — POC GLUCOSE
Lab: 133 mg/dL — ABNORMAL HIGH (ref 70–100)
Lab: 125 mg/dL — ABNORMAL HIGH (ref 70–100)
Lab: 122 mg/dL — ABNORMAL HIGH (ref 70–100)
Lab: 122 mg/dL — ABNORMAL HIGH (ref 70–100)
Lab: 113 mg/dL — ABNORMAL HIGH (ref 70–100)

## 2017-06-02 LAB — CBC AND DIFF: Lab: 8.6 10*3/uL — ABNORMAL LOW (ref >60)

## 2017-06-02 LAB — MAGNESIUM: Lab: 2 mg/dL — ABNORMAL LOW (ref 1.6–2.6)

## 2017-06-02 MED ORDER — FENTANYL CITRATE (PF) 50 MCG/ML IJ SOLN
25 ug | INTRAVENOUS | 0 refills | Status: DC | PRN
Start: 2017-06-02 — End: 2017-06-04
  Administered 2017-06-02 – 2017-06-03 (×6): 25 ug via INTRAVENOUS

## 2017-06-02 MED ORDER — NPH INSULIN HUMAN RECOMB 100 UNIT/ML (3 ML) SC PEN
36 [IU] | Freq: Every day | SUBCUTANEOUS | 0 refills | Status: DC
Start: 2017-06-02 — End: 2017-06-05

## 2017-06-02 MED ORDER — PROPOFOL INJ 10 MG/ML IV VIAL
0 refills | Status: DC
Start: 2017-06-02 — End: 2017-06-02
  Administered 2017-06-02 (×2): 20 mg via INTRAVENOUS
  Administered 2017-06-02: 20:00:00 10 mg via INTRAVENOUS
  Administered 2017-06-02: 20:00:00 50 mg via INTRAVENOUS
  Administered 2017-06-02: 20:00:00 20 mg via INTRAVENOUS
  Administered 2017-06-02: 20:00:00 50 mg via INTRAVENOUS
  Administered 2017-06-02 (×2): 20 mg via INTRAVENOUS
  Administered 2017-06-02: 20:00:00 100 mg via INTRAVENOUS
  Administered 2017-06-02: 20:00:00 20 mg via INTRAVENOUS

## 2017-06-02 MED ORDER — SODIUM CHLORIDE 0.9 % IV SOLP (OR) 500ML
0 refills | Status: DC
Start: 2017-06-02 — End: 2017-06-02
  Administered 2017-06-02: 20:00:00 via INTRAVENOUS

## 2017-06-02 MED ORDER — NPH INSULIN HUMAN RECOMB 100 UNIT/ML (3 ML) SC PEN
30 [IU] | Freq: Every evening | SUBCUTANEOUS | 0 refills | Status: DC
Start: 2017-06-02 — End: 2017-06-05

## 2017-06-02 MED ORDER — METFORMIN 500 MG PO TAB
1000 mg | Freq: Two times a day (BID) | ORAL | 0 refills | Status: DC
Start: 2017-06-02 — End: 2017-06-06
  Administered 2017-06-02 – 2017-06-05 (×7): 1000 mg via ORAL

## 2017-06-02 MED ORDER — LIDOCAINE (PF) 200 MG/10 ML (2 %) IJ SYRG
0 refills | Status: DC
Start: 2017-06-02 — End: 2017-06-02
  Administered 2017-06-02: 20:00:00 50 mg via INTRAVENOUS

## 2017-06-02 MED ORDER — PHENYLEPHRINE IN 0.9% NACL(PF) 1 MG/10 ML (100 MCG/ML) IV SYRG
0 refills | Status: DC
Start: 2017-06-02 — End: 2017-06-02
  Administered 2017-06-02 (×2): 100 ug via INTRAVENOUS

## 2017-06-02 MED ADMIN — WATER FOR IRRIGATION, STERILE IR SOLN [7485]: Stop: 2017-06-02 | NDC 00338000404

## 2017-06-02 MED ADMIN — SODIUM CHLORIDE 0.9 % IV SOLP [27838]: 1000 mL | URETHRAL | Stop: 2017-06-02 | NDC 00338004904

## 2017-06-03 ENCOUNTER — Encounter: Admit: 2017-06-03 | Discharge: 2017-06-03 | Payer: MEDICAID

## 2017-06-03 DIAGNOSIS — I10 Essential (primary) hypertension: ICD-10-CM

## 2017-06-03 DIAGNOSIS — J449 Chronic obstructive pulmonary disease, unspecified: ICD-10-CM

## 2017-06-03 DIAGNOSIS — G4730 Sleep apnea, unspecified: Principal | ICD-10-CM

## 2017-06-03 DIAGNOSIS — E119 Type 2 diabetes mellitus without complications: ICD-10-CM

## 2017-06-03 DIAGNOSIS — J45909 Unspecified asthma, uncomplicated: ICD-10-CM

## 2017-06-03 LAB — POC GLUCOSE
Lab: 120 mg/dL — ABNORMAL HIGH (ref 70–100)
Lab: 151 mg/dL — ABNORMAL HIGH (ref 70–100)
Lab: 141 mg/dL — ABNORMAL HIGH (ref 70–100)
Lab: 192 mg/dL — ABNORMAL HIGH (ref 70–100)
Lab: 96 mg/dL (ref 70–100)

## 2017-06-03 LAB — CULTURE-BLOOD W/SENSITIVITY

## 2017-06-03 LAB — COMPREHENSIVE METABOLIC PANEL: Lab: 137 MMOL/L — ABNORMAL LOW (ref 137–147)

## 2017-06-03 LAB — CBC AND DIFF: Lab: 7.4 10*3/uL (ref >60)

## 2017-06-03 MED ORDER — OXYCODONE 5 MG PO TAB
5-10 mg | ORAL | 0 refills | Status: DC | PRN
Start: 2017-06-03 — End: 2017-06-06
  Administered 2017-06-03 – 2017-06-04 (×8): 5 mg via ORAL
  Administered 2017-06-04 – 2017-06-05 (×5): 10 mg via ORAL

## 2017-06-03 MED ORDER — VANCOMYCIN 2,000 MG IVPB
2000 mg | Freq: Two times a day (BID) | INTRAVENOUS | 0 refills | Status: DC
Start: 2017-06-03 — End: 2017-06-04
  Administered 2017-06-03 – 2017-06-04 (×5): 2000 mg via INTRAVENOUS

## 2017-06-03 MED ORDER — LIDOCAINE 5 % TP PTMD
1 | Freq: Every day | TOPICAL | 0 refills | Status: DC
Start: 2017-06-03 — End: 2017-06-06
  Administered 2017-06-03 – 2017-06-05 (×3): 1 via TOPICAL

## 2017-06-03 MED ORDER — VANCOMYCIN PHARMACY TO MANAGE
1 | 0 refills | Status: DC
Start: 2017-06-03 — End: 2017-06-04

## 2017-06-03 MED ORDER — GUAIFENESIN 600 MG PO TA12
600 mg | Freq: Two times a day (BID) | ORAL | 0 refills | Status: DC
Start: 2017-06-03 — End: 2017-06-06
  Administered 2017-06-03 – 2017-06-05 (×5): 600 mg via ORAL

## 2017-06-04 LAB — COMPREHENSIVE METABOLIC PANEL
Lab: 0.2 mg/dL — ABNORMAL LOW (ref 0.3–1.2)
Lab: 0.7 mg/dL (ref 0.4–1.24)
Lab: 100 MMOL/L — ABNORMAL LOW (ref 98–110)
Lab: 133 MMOL/L — ABNORMAL LOW (ref 137–147)
Lab: 140 mg/dL — ABNORMAL HIGH (ref 70–100)
Lab: 23 U/L — ABNORMAL LOW (ref 7–40)
Lab: 26 U/L (ref 7–56)
Lab: 3.5 g/dL (ref 3.5–5.0)
Lab: 4.1 MMOL/L — ABNORMAL LOW (ref 3.5–5.1)
Lab: 7.4 g/dL (ref 6.0–8.0)
Lab: 9 mg/dL (ref 8.5–10.6)
Lab: >60 mL/min (ref >60)
Lab: >60 mL/min (ref >60)

## 2017-06-04 LAB — CBC AND DIFF
Lab: 0 10*3/uL (ref 0–0.20)
Lab: 0.1 10*3/uL (ref 0–0.45)
Lab: 8.3 10*3/uL (ref 4.5–11.0)

## 2017-06-04 LAB — POC GLUCOSE
Lab: 126 mg/dL — ABNORMAL HIGH (ref 70–100)
Lab: 152 mg/dL — ABNORMAL HIGH (ref 70–100)
Lab: 90 mg/dL (ref 70–100)
Lab: 79 mg/dL (ref 70–100)
Lab: 107 mg/dL — ABNORMAL HIGH (ref 70–100)

## 2017-06-04 LAB — CULTURE-URINE W/SENSITIVITY
Lab: <10
Lab: <10
Lab: <10 — AB

## 2017-06-04 LAB — VANCOMYCIN TROUGH: Lab: 8.5 ug/mL — ABNORMAL LOW (ref 10.0–20.0)

## 2017-06-04 MED ORDER — INSULIN ASPART 100 UNIT/ML SC FLEXPEN
18 [IU] | Freq: Three times a day (TID) | SUBCUTANEOUS | 0 refills | Status: DC
Start: 2017-06-04 — End: 2017-06-06

## 2017-06-04 MED ORDER — VANCOMYCIN PHARMACY TO MANAGE
1 | 0 refills | Status: DC
Start: 2017-06-04 — End: 2017-06-06

## 2017-06-04 MED ORDER — VANCOMYCIN 2,000 MG IVPB
2000 mg | INTRAVENOUS | 0 refills | Status: DC
Start: 2017-06-04 — End: 2017-06-05
  Administered 2017-06-05 (×3): 2000 mg via INTRAVENOUS

## 2017-06-05 ENCOUNTER — Inpatient Hospital Stay: Admit: 2017-06-01 | Discharge: 2017-06-01 | Payer: MEDICAID

## 2017-06-05 ENCOUNTER — Inpatient Hospital Stay: Admit: 2017-06-02 | Discharge: 2017-06-02 | Payer: Medicaid Other

## 2017-06-05 ENCOUNTER — Inpatient Hospital Stay: Admit: 2017-05-28 | Discharge: 2017-06-05 | Disposition: A | Discharge status: Home Health | Payer: Medicaid Other

## 2017-06-05 ENCOUNTER — Emergency Department: Admit: 2017-05-28 | Discharge: 2017-05-28 | Payer: MEDICAID

## 2017-06-05 ENCOUNTER — Observation Stay: Admit: 2017-05-28 | Discharge: 2017-05-28 | Payer: Medicaid Other

## 2017-06-05 ENCOUNTER — Inpatient Hospital Stay: Admit: 2017-06-01 | Discharge: 2017-06-01 | Payer: Medicaid Other

## 2017-06-05 ENCOUNTER — Emergency Department: Admit: 2017-05-27 | Discharge: 2017-05-27 | Payer: MEDICAID

## 2017-06-05 DIAGNOSIS — N401 Enlarged prostate with lower urinary tract symptoms: ICD-10-CM

## 2017-06-05 DIAGNOSIS — E781 Pure hyperglyceridemia: ICD-10-CM

## 2017-06-05 DIAGNOSIS — E1165 Type 2 diabetes mellitus with hyperglycemia: ICD-10-CM

## 2017-06-05 DIAGNOSIS — Z794 Long term (current) use of insulin: ICD-10-CM

## 2017-06-05 DIAGNOSIS — Z9079 Acquired absence of other genital organ(s): ICD-10-CM

## 2017-06-05 DIAGNOSIS — E669 Obesity, unspecified: ICD-10-CM

## 2017-06-05 DIAGNOSIS — Z8701 Personal history of pneumonia (recurrent): ICD-10-CM

## 2017-06-05 DIAGNOSIS — M4807 Spinal stenosis, lumbosacral region: ICD-10-CM

## 2017-06-05 DIAGNOSIS — K5900 Constipation, unspecified: ICD-10-CM

## 2017-06-05 DIAGNOSIS — F329 Major depressive disorder, single episode, unspecified: ICD-10-CM

## 2017-06-05 DIAGNOSIS — R197 Diarrhea, unspecified: ICD-10-CM

## 2017-06-05 DIAGNOSIS — E1149 Type 2 diabetes mellitus with other diabetic neurological complication: ICD-10-CM

## 2017-06-05 DIAGNOSIS — M4802 Spinal stenosis, cervical region: ICD-10-CM

## 2017-06-05 DIAGNOSIS — Z8614 Personal history of Methicillin resistant Staphylococcus aureus infection: ICD-10-CM

## 2017-06-05 DIAGNOSIS — I10 Essential (primary) hypertension: ICD-10-CM

## 2017-06-05 DIAGNOSIS — A4181 Sepsis due to Enterococcus: Principal | ICD-10-CM

## 2017-06-05 DIAGNOSIS — J449 Chronic obstructive pulmonary disease, unspecified: ICD-10-CM

## 2017-06-05 DIAGNOSIS — R338 Other retention of urine: ICD-10-CM

## 2017-06-05 DIAGNOSIS — Z9114 Patient's other noncompliance with medication regimen: ICD-10-CM

## 2017-06-05 DIAGNOSIS — N3289 Other specified disorders of bladder: ICD-10-CM

## 2017-06-05 DIAGNOSIS — Z6841 Body Mass Index (BMI) 40.0 and over, adult: ICD-10-CM

## 2017-06-05 DIAGNOSIS — Z9119 Patient's noncompliance with other medical treatment and regimen: ICD-10-CM

## 2017-06-05 DIAGNOSIS — E1143 Type 2 diabetes mellitus with diabetic autonomic (poly)neuropathy: ICD-10-CM

## 2017-06-05 DIAGNOSIS — N412 Abscess of prostate: ICD-10-CM

## 2017-06-05 DIAGNOSIS — G4733 Obstructive sleep apnea (adult) (pediatric): ICD-10-CM

## 2017-06-05 LAB — COMPREHENSIVE METABOLIC PANEL: Lab: 135 MMOL/L — ABNORMAL LOW (ref 137–147)

## 2017-06-05 LAB — POC GLUCOSE
Lab: 110 mg/dL — ABNORMAL HIGH (ref 70–100)
Lab: 80 mg/dL (ref 70–100)
Lab: 88 mg/dL (ref 70–100)
Lab: 78 mg/dL — ABNORMAL LOW (ref 70–100)
Lab: 90 mg/dL (ref 70–100)

## 2017-06-05 LAB — VANCOMYCIN TROUGH: Lab: 21 ug/mL — ABNORMAL HIGH (ref 10.0–20.0)

## 2017-06-05 LAB — CBC AND DIFF: Lab: 7.4 10*3/uL — ABNORMAL LOW (ref 4.5–11.0)

## 2017-06-05 MED ORDER — NPH INSULIN HUMAN RECOMB 100 UNIT/ML (3 ML) SC PEN
30 [IU] | Freq: Every day | SUBCUTANEOUS | 0 refills | Status: DC
Start: 2017-06-05 — End: 2017-06-06

## 2017-06-05 MED ORDER — METFORMIN 1,000 MG PO TAB
1000 mg | ORAL_TABLET | Freq: Two times a day (BID) | ORAL | 3 refills | Status: AC
Start: 2017-06-05 — End: 2017-12-10

## 2017-06-05 MED ORDER — FLUTICASONE PROPION-SALMETEROL 250-50 MCG/DOSE IN DSDV
1 | Freq: Two times a day (BID) | RESPIRATORY_TRACT | 3 refills | Status: AC
Start: 2017-06-05 — End: ?

## 2017-06-05 MED ORDER — GLUCOSAMINE SULFATE 500 MG PO TAB
500 mg | Freq: Every day | ORAL | 0 refills | Status: AC
Start: 2017-06-05 — End: ?

## 2017-06-05 MED ORDER — VANCOMYCIN 2,000 MG IVPB
2000 mg | Freq: Two times a day (BID) | INTRAVENOUS | 0 refills | Status: DC
Start: 2017-06-05 — End: 2017-06-06
  Administered 2017-06-05 (×2): 2000 mg via INTRAVENOUS

## 2017-06-05 MED ORDER — OXYBUTYNIN CHLORIDE 5 MG PO TAB
5 mg | ORAL_TABLET | Freq: Three times a day (TID) | ORAL | 3 refills | 12.00000 days | Status: AC
Start: 2017-06-05 — End: 2017-06-08

## 2017-06-05 MED ORDER — VANCOMYCIN 2,000 MG IVPB
2000 mg | Freq: Two times a day (BID) | INTRAVENOUS | 0 refills | 30.00000 days | Status: AC
Start: 2017-06-05 — End: 2017-06-09

## 2017-06-05 MED ORDER — HYOSCYAMINE SULFATE 0.125 MG PO TBDI
.125 mg | ORAL_TABLET | SUBLINGUAL | 0 refills | Status: AC | PRN
Start: 2017-06-05 — End: 2017-12-02

## 2017-06-05 MED ORDER — GABAPENTIN 400 MG PO CAP
800 mg | ORAL_CAPSULE | ORAL | 0 refills | Status: AC
Start: 2017-06-05 — End: 2019-01-09

## 2017-06-05 MED ORDER — NPH INSULIN HUMAN RECOMB 100 UNIT/ML (3 ML) SC PEN
27 [IU] | Freq: Every evening | SUBCUTANEOUS | 0 refills | Status: DC
Start: 2017-06-05 — End: 2017-06-06

## 2017-06-05 MED ORDER — LISINOPRIL 20 MG PO TAB
20 mg | ORAL_TABLET | Freq: Every day | ORAL | 3 refills | Status: AC
Start: 2017-06-05 — End: 2017-06-29

## 2017-06-05 MED ORDER — MELATONIN 10 MG PO TAB
1 | ORAL_TABLET | Freq: Every evening | ORAL | 0 refills | 28.00000 days | Status: AC | PRN
Start: 2017-06-05 — End: ?

## 2017-06-05 MED ORDER — LACTOBACILLUS RHAMNOSUS GG 10 BILLION CELL PO CAP
1 | ORAL_CAPSULE | Freq: Every day | ORAL | 0 refills | Status: AC
Start: 2017-06-05 — End: ?

## 2017-06-05 MED ORDER — GUAIFENESIN 600 MG PO TA12
600 mg | ORAL_TABLET | Freq: Two times a day (BID) | ORAL | 0 refills | 14.00000 days | Status: AC
Start: 2017-06-05 — End: ?

## 2017-06-05 MED ORDER — INSULIN NPH ISOPH U-100 HUMAN 100 UNIT/ML SC SUSP
1 refills | 32.00000 days | Status: AC
Start: 2017-06-05 — End: 2017-12-02

## 2017-06-05 MED ORDER — TAMSULOSIN 0.4 MG PO CAP
.4 mg | ORAL_CAPSULE | Freq: Two times a day (BID) | ORAL | 3 refills | 90.00000 days | Status: AC
Start: 2017-06-05 — End: ?

## 2017-06-05 MED ORDER — HUMULIN R REGULAR U-100 INSULN 100 UNIT/ML IJ SOLN
Freq: Three times a day (TID) | INTRAMUSCULAR | 0 refills | 30.00000 days | Status: AC
Start: 2017-06-05 — End: ?

## 2017-06-05 MED ORDER — OXYCODONE 5 MG PO TAB
5 mg | ORAL_TABLET | ORAL | 0 refills | 6.00000 days | Status: AC | PRN
Start: 2017-06-05 — End: 2017-12-02

## 2017-06-05 MED ORDER — CARVEDILOL 12.5 MG PO TAB
12.5 mg | ORAL_TABLET | Freq: Two times a day (BID) | ORAL | 3 refills | 90.00000 days | Status: AC
Start: 2017-06-05 — End: 2018-06-27

## 2017-06-05 MED ORDER — OMEGA-3S-DHA-EPA-FISH OIL 120 MG-180 MG- 60 MG-1,200 MG PO CPDR
1 | ORAL_CAPSULE | Freq: Every day | ORAL | 0 refills | Status: AC
Start: 2017-06-05 — End: ?

## 2017-06-07 ENCOUNTER — Encounter: Admit: 2017-06-07 | Discharge: 2017-06-07 | Payer: MEDICAID

## 2017-06-07 ENCOUNTER — Encounter: Admit: 2017-06-07 | Discharge: 2017-06-07 | Payer: Medicaid Other

## 2017-06-07 DIAGNOSIS — N412 Abscess of prostate: Principal | ICD-10-CM

## 2017-06-07 LAB — CULTURE-BLOOD W/SENSITIVITY

## 2017-06-07 MED ORDER — PHENAZOPYRIDINE 200 MG PO TAB
200 mg | ORAL_TABLET | Freq: Three times a day (TID) | ORAL | 0 refills | Status: AC | PRN
Start: 2017-06-07 — End: 2017-12-02

## 2017-06-07 MED ORDER — OXYBUTYNIN CHLORIDE 5 MG PO TAB
5 mg | ORAL_TABLET | Freq: Three times a day (TID) | ORAL | 0 refills | Status: SS
Start: 2017-06-07 — End: 2018-07-14

## 2017-06-08 ENCOUNTER — Encounter: Admit: 2017-06-08 | Discharge: 2017-06-08 | Payer: MEDICAID

## 2017-06-09 ENCOUNTER — Encounter: Admit: 2017-06-09 | Discharge: 2017-06-09 | Payer: MEDICAID

## 2017-06-09 DIAGNOSIS — N412 Abscess of prostate: Principal | ICD-10-CM

## 2017-06-09 MED ORDER — VANCOMYCIN IVPB
0 refills | 10.00000 days | Status: AC
Start: 2017-06-09 — End: 2017-06-16

## 2017-06-11 ENCOUNTER — Encounter: Admit: 2017-06-11 | Discharge: 2017-06-11 | Payer: MEDICAID

## 2017-06-11 MED ORDER — KETOROLAC 10 MG PO TAB
10 mg | ORAL_TABLET | ORAL | 0 refills | Status: AC | PRN
Start: 2017-06-11 — End: 2017-06-29

## 2017-06-11 MED ORDER — HYOSCYAMINE SULFATE 0.125 MG SL SUBL
125 ug | ORAL_TABLET | SUBLINGUAL | 3 refills | Status: AC | PRN
Start: 2017-06-11 — End: 2018-06-03

## 2017-06-15 LAB — CREATININE: Lab: 1

## 2017-06-15 LAB — BUN: Lab: 17

## 2017-06-15 LAB — VANCOMYCIN TROUGH: Lab: 16

## 2017-06-15 LAB — CBC: Lab: 6.4

## 2017-06-15 LAB — ALK PHOS TOTAL: Lab: 50

## 2017-06-15 LAB — AST (SGOT): Lab: 15

## 2017-06-15 LAB — POTASSIUM: Lab: 4.3

## 2017-06-15 LAB — ALT (SGPT): Lab: 20

## 2017-06-15 LAB — PLATELET COUNT: Lab: 244

## 2017-06-16 ENCOUNTER — Encounter: Admit: 2017-06-16 | Discharge: 2017-06-16 | Payer: MEDICAID

## 2017-06-16 ENCOUNTER — Encounter: Admit: 2017-06-16 | Discharge: 2017-06-16

## 2017-06-16 ENCOUNTER — Encounter: Admit: 2017-06-16 | Discharge: 2017-06-17

## 2017-06-16 DIAGNOSIS — H9191 Unspecified hearing loss, right ear: ICD-10-CM

## 2017-06-16 DIAGNOSIS — N412 Abscess of prostate: Principal | ICD-10-CM

## 2017-06-16 MED ORDER — VANCOMYCIN IVPB
0 refills | 10.00000 days | Status: AC
Start: 2017-06-16 — End: 2017-06-29

## 2017-06-17 ENCOUNTER — Encounter: Admit: 2017-06-17 | Discharge: 2017-06-17 | Payer: Medicaid Other

## 2017-06-17 ENCOUNTER — Encounter: Admit: 2017-06-17 | Discharge: 2017-06-18

## 2017-06-17 ENCOUNTER — Encounter: Admit: 2017-06-17 | Discharge: 2017-06-17 | Payer: MEDICAID

## 2017-06-17 DIAGNOSIS — G4730 Sleep apnea, unspecified: Principal | ICD-10-CM

## 2017-06-17 DIAGNOSIS — E119 Type 2 diabetes mellitus without complications: ICD-10-CM

## 2017-06-17 DIAGNOSIS — I10 Essential (primary) hypertension: ICD-10-CM

## 2017-06-17 DIAGNOSIS — J45909 Unspecified asthma, uncomplicated: ICD-10-CM

## 2017-06-17 DIAGNOSIS — R7881 Bacteremia: ICD-10-CM

## 2017-06-17 DIAGNOSIS — J449 Chronic obstructive pulmonary disease, unspecified: ICD-10-CM

## 2017-06-17 DIAGNOSIS — E669 Obesity, unspecified: ICD-10-CM

## 2017-06-17 MED ORDER — ALBUTEROL SULFATE 90 MCG/ACTUATION IN HFAA
2 | RESPIRATORY_TRACT | 0 refills | Status: DC | PRN
Start: 2017-06-17 — End: 2017-06-19

## 2017-06-17 MED ORDER — INSULIN ASPART 100 UNIT/ML SC FLEXPEN
0-12 [IU] | Freq: Before meals | SUBCUTANEOUS | 0 refills | Status: DC
Start: 2017-06-17 — End: 2017-06-29
  Administered 2017-06-18 – 2017-06-29 (×3): 2 [IU] via SUBCUTANEOUS

## 2017-06-17 MED ORDER — DAPTOMYCIN SYR
6 mg/kg | INTRAVENOUS | 0 refills | Status: DC
Start: 2017-06-17 — End: 2017-06-21
  Administered 2017-06-18 – 2017-06-21 (×4): 650 mg via INTRAVENOUS

## 2017-06-17 MED ORDER — CARVEDILOL 12.5 MG PO TAB
12.5 mg | Freq: Two times a day (BID) | ORAL | 0 refills | Status: DC
Start: 2017-06-17 — End: 2017-06-29
  Administered 2017-06-18 – 2017-06-29 (×23): 12.5 mg via ORAL

## 2017-06-17 MED ORDER — TAMSULOSIN 0.4 MG PO CAP
.4 mg | Freq: Two times a day (BID) | ORAL | 0 refills | Status: DC
Start: 2017-06-17 — End: 2017-06-29
  Administered 2017-06-18 – 2017-06-29 (×24): 0.4 mg via ORAL

## 2017-06-17 MED ORDER — PNEUMOCOCCAL 23-VAL PS VACCINE 25 MCG/0.5 ML IJ SOLN
.5 mL | Freq: Once | INTRAMUSCULAR | 0 refills | Status: DC
Start: 2017-06-17 — End: 2017-06-18

## 2017-06-17 MED ORDER — INSULIN ASPART 100 UNIT/ML SC FLEXPEN
14 [IU] | Freq: Three times a day (TID) | SUBCUTANEOUS | 0 refills | Status: DC
Start: 2017-06-17 — End: 2017-06-29

## 2017-06-17 MED ORDER — GABAPENTIN 400 MG PO CAP
800 mg | Freq: Every day | ORAL | 0 refills | Status: DC
Start: 2017-06-17 — End: 2017-06-18

## 2017-06-17 MED ORDER — GABAPENTIN 400 MG PO CAP
400 mg | Freq: Every day | ORAL | 0 refills | Status: DC
Start: 2017-06-17 — End: 2017-06-29
  Administered 2017-06-18 – 2017-06-29 (×11): 400 mg via ORAL

## 2017-06-17 MED ORDER — OXYCODONE 5 MG PO TAB
5 mg | ORAL | 0 refills | Status: DC | PRN
Start: 2017-06-17 — End: 2017-06-18
  Administered 2017-06-18: 04:00:00 5 mg via ORAL

## 2017-06-17 MED ORDER — PNEUMOCOCCAL 23-VAL PS VACCINE 25 MCG/0.5 ML IJ SOLN
.5 mL | Freq: Once | INTRAMUSCULAR | 0 refills | Status: CP
Start: 2017-06-17 — End: ?
  Administered 2017-06-27: 03:00:00 0.5 mL via INTRAMUSCULAR

## 2017-06-17 MED ORDER — NPH INSULIN HUMAN RECOMB 100 UNIT/ML (3 ML) SC PEN
24 [IU] | Freq: Two times a day (BID) | SUBCUTANEOUS | 0 refills | Status: DC
Start: 2017-06-17 — End: 2017-06-29
  Administered 2017-06-18 – 2017-06-29 (×3): 24 [IU] via SUBCUTANEOUS

## 2017-06-17 MED ORDER — LACTATED RINGERS IV SOLP
INTRAVENOUS | 0 refills | Status: AC
Start: 2017-06-17 — End: ?
  Administered 2017-06-18 – 2017-06-19 (×3): 1000.000 mL via INTRAVENOUS

## 2017-06-17 MED ORDER — MELATONIN 5 MG PO TAB
10 mg | Freq: Every evening | ORAL | 0 refills | Status: DC | PRN
Start: 2017-06-17 — End: 2017-06-29
  Administered 2017-06-18 – 2017-06-27 (×2): 10 mg via ORAL

## 2017-06-17 MED ORDER — ONDANSETRON HCL (PF) 4 MG/2 ML IJ SOLN
4 mg | INTRAVENOUS | 0 refills | Status: DC | PRN
Start: 2017-06-17 — End: 2017-06-29

## 2017-06-17 MED ORDER — POLYETHYLENE GLYCOL 3350 17 GRAM PO PWPK
1 | Freq: Every day | ORAL | 0 refills | Status: DC | PRN
Start: 2017-06-17 — End: 2017-06-21
  Administered 2017-06-20: 13:00:00 17 g via ORAL

## 2017-06-17 MED ORDER — METHOCARBAMOL 750 MG PO TAB
750 mg | Freq: Two times a day (BID) | ORAL | 0 refills | Status: DC
Start: 2017-06-17 — End: 2017-06-29
  Administered 2017-06-18 – 2017-06-29 (×24): 750 mg via ORAL

## 2017-06-17 MED ORDER — FLUTICASONE PROPION-SALMETEROL 250-50 MCG/DOSE IN DSDV
1 | Freq: Two times a day (BID) | RESPIRATORY_TRACT | 0 refills | Status: DC
Start: 2017-06-17 — End: 2017-06-29
  Administered 2017-06-18 – 2017-06-28 (×4): 1 via RESPIRATORY_TRACT

## 2017-06-17 MED ORDER — ENOXAPARIN 60 MG/0.6 ML SC SYRG
60 mg | Freq: Two times a day (BID) | SUBCUTANEOUS | 0 refills | Status: DC
Start: 2017-06-17 — End: 2017-06-29
  Administered 2017-06-18 – 2017-06-29 (×24): 60 mg via SUBCUTANEOUS

## 2017-06-17 MED ORDER — ACETAMINOPHEN 325 MG PO TAB
650 mg | ORAL | 0 refills | Status: DC | PRN
Start: 2017-06-17 — End: 2017-06-29
  Administered 2017-06-19 – 2017-06-29 (×10): 650 mg via ORAL

## 2017-06-17 MED ORDER — HYOSCYAMINE SULFATE 0.125 MG PO TBDI
.125 mg | SUBLINGUAL | 0 refills | Status: DC | PRN
Start: 2017-06-17 — End: 2017-06-29
  Administered 2017-06-20: 13:00:00 0.125 mg via SUBLINGUAL

## 2017-06-17 MED ORDER — OXYBUTYNIN CHLORIDE 5 MG PO TAB
5 mg | Freq: Three times a day (TID) | ORAL | 0 refills | Status: DC
Start: 2017-06-17 — End: 2017-06-29
  Administered 2017-06-18 – 2017-06-29 (×35): 5 mg via ORAL

## 2017-06-18 ENCOUNTER — Encounter: Admit: 2017-06-18 | Discharge: 2017-06-18 | Payer: MEDICAID

## 2017-06-18 LAB — PTT (APTT): Lab: 27 s — ABNORMAL LOW (ref 24.0–36.5)

## 2017-06-18 LAB — URINALYSIS MICROSCOPIC REFLEX TO CULTURE

## 2017-06-18 LAB — CREATININE-URINE RANDOM: Lab: 108 mg/dL (ref 0–5)

## 2017-06-18 LAB — VANCOMYCIN TROUGH: Lab: 39 ug/mL — ABNORMAL HIGH (ref 10.0–20.0)

## 2017-06-18 LAB — POC GLUCOSE
Lab: 95 mg/dL — ABNORMAL LOW (ref 70–100)
Lab: 123 mg/dL — ABNORMAL HIGH (ref 70–100)
Lab: 185 mg/dL — ABNORMAL HIGH (ref 70–100)
Lab: 134 mg/dL — ABNORMAL HIGH (ref 70–100)

## 2017-06-18 LAB — URINALYSIS DIPSTICK REFLEX TO CULTURE: Lab: NEGATIVE

## 2017-06-18 LAB — CBC: Lab: 7.1 K/UL (ref 4.5–11.0)

## 2017-06-18 LAB — MAGNESIUM: Lab: 1.7 mg/dL — ABNORMAL LOW (ref 1.6–2.6)

## 2017-06-18 LAB — CBC AND DIFF: Lab: 7.4 10*3/uL (ref 4.5–11.0)

## 2017-06-18 LAB — BNP (B-TYPE NATRIURETIC PEPTI): Lab: 112 pg/mL — ABNORMAL HIGH (ref 0–100)

## 2017-06-18 LAB — PHOSPHORUS: Lab: 4.2 mg/dL — ABNORMAL LOW (ref 2.0–4.5)

## 2017-06-18 LAB — COMPREHENSIVE METABOLIC PANEL
Lab: 134 MMOL/L — ABNORMAL LOW (ref 137–147)
Lab: 133 MMOL/L — ABNORMAL LOW (ref >60)

## 2017-06-18 LAB — SODIUM-URINE RANDOM: Lab: 73 MMOL/L — AB (ref 7–40)

## 2017-06-18 LAB — PROTIME INR (PT): Lab: 1.1 M/UL — ABNORMAL LOW (ref 0.8–1.2)

## 2017-06-18 LAB — CREATINE KINASE-CPK: Lab: 90 U/L (ref 35–232)

## 2017-06-18 LAB — TROPONIN-I

## 2017-06-18 LAB — D-DIMER: Lab: 880 ng{FEU}/mL — ABNORMAL HIGH (ref <500)

## 2017-06-18 MED ORDER — TRAZODONE 50 MG PO TAB
50-100 mg | Freq: Every evening | ORAL | 0 refills | Status: DC | PRN
Start: 2017-06-18 — End: 2017-06-29
  Administered 2017-06-19: 05:00:00 100 mg via ORAL
  Administered 2017-06-20 – 2017-06-28 (×9): 50 mg via ORAL

## 2017-06-18 MED ORDER — OXYCODONE 5 MG PO TAB
5-10 mg | ORAL | 0 refills | Status: DC | PRN
Start: 2017-06-18 — End: 2017-06-29
  Administered 2017-06-18: 06:00:00 5 mg via ORAL
  Administered 2017-06-18 – 2017-06-19 (×3): 10 mg via ORAL
  Administered 2017-06-19: 16:00:00 5 mg via ORAL
  Administered 2017-06-20 – 2017-06-22 (×7): 10 mg via ORAL
  Administered 2017-06-22 – 2017-06-23 (×3): 5 mg via ORAL
  Administered 2017-06-23: 03:00:00 10 mg via ORAL
  Administered 2017-06-25 – 2017-06-26 (×4): 5 mg via ORAL
  Administered 2017-06-26 – 2017-06-28 (×5): 10 mg via ORAL
  Administered 2017-06-28: 15:00:00 5 mg via ORAL
  Administered 2017-06-29: 12:00:00 10 mg via ORAL
  Administered 2017-06-29: 01:00:00 5 mg via ORAL

## 2017-06-18 MED ORDER — RP DX TC-99M MAA MCI
5 | Freq: Once | INTRAVENOUS | 0 refills | Status: CP
Start: 2017-06-18 — End: ?
  Administered 2017-06-18: 13:00:00 5.2 via INTRAVENOUS

## 2017-06-18 MED ORDER — TRAZODONE 50 MG PO TAB
25 mg | Freq: Three times a day (TID) | ORAL | 0 refills | Status: DC | PRN
Start: 2017-06-18 — End: 2017-06-29
  Administered 2017-06-27: 23:00:00 25 mg via ORAL

## 2017-06-18 MED ORDER — RP DX XE-133 XENON MCI
30 | Freq: Once | RESPIRATORY_TRACT | 0 refills | Status: CP
Start: 2017-06-18 — End: ?
  Administered 2017-06-18: 13:00:00 33 via RESPIRATORY_TRACT

## 2017-06-18 MED ORDER — CIPROFLOXACIN-DEXAMETHASONE 0.3-0.1 % OT DRPS
4 [drp] | Freq: Two times a day (BID) | OTIC | 0 refills | Status: DC
Start: 2017-06-18 — End: 2017-06-29
  Administered 2017-06-18 – 2017-06-29 (×2): 4 [drp] via OTIC

## 2017-06-18 MED ORDER — ESCITALOPRAM OXALATE 10 MG PO TAB
5 mg | Freq: Every day | ORAL | 0 refills | Status: DC
Start: 2017-06-18 — End: 2017-06-29
  Administered 2017-06-19 – 2017-06-29 (×11): 5 mg via ORAL

## 2017-06-19 LAB — POC GLUCOSE
Lab: 150 mg/dL — ABNORMAL HIGH (ref 70–100)
Lab: 140 mg/dL — ABNORMAL HIGH (ref 70–100)
Lab: 139 mg/dL — ABNORMAL HIGH (ref 70–100)
Lab: 131 mg/dL — ABNORMAL HIGH (ref 70–100)

## 2017-06-19 LAB — CULTURE-URINE W/SENSITIVITY

## 2017-06-19 LAB — MAGNESIUM: Lab: 2 mg/dL — ABNORMAL LOW (ref >60)

## 2017-06-19 LAB — COMPREHENSIVE METABOLIC PANEL: Lab: 138 MMOL/L — ABNORMAL LOW (ref >60)

## 2017-06-19 LAB — PHOSPHORUS: Lab: 4.6 mg/dL — ABNORMAL HIGH (ref 2.0–4.5)

## 2017-06-19 LAB — CREATININE-URINE RANDOM: Lab: 79 mg/dL

## 2017-06-19 LAB — SODIUM-URINE RANDOM: Lab: 88 MMOL/L

## 2017-06-19 LAB — CBC: Lab: 5.8 10*3/uL — ABNORMAL HIGH (ref >60)

## 2017-06-19 MED ORDER — AMINOPHYLLINE 500 MG/20 ML IV SOLN
50 mg | INTRAVENOUS | 0 refills | Status: AC | PRN
Start: 2017-06-19 — End: ?

## 2017-06-19 MED ORDER — SODIUM CHLORIDE 0.9 % IV SOLP
1000 mL | INTRAVENOUS | 0 refills | Status: DC
Start: 2017-06-19 — End: 2017-06-20
  Administered 2017-06-19 – 2017-06-20 (×2): 1000 mL via INTRAVENOUS

## 2017-06-19 MED ORDER — SODIUM CHLORIDE 0.9 % IV SOLP
250 mL | INTRAVENOUS | 0 refills | Status: AC | PRN
Start: 2017-06-19 — End: ?

## 2017-06-19 MED ORDER — ALBUTEROL SULFATE 90 MCG/ACTUATION IN HFAA
2 | RESPIRATORY_TRACT | 0 refills | Status: DC | PRN
Start: 2017-06-19 — End: 2017-06-29

## 2017-06-19 MED ORDER — LORAZEPAM 1 MG PO TAB
1 mg | ORAL | 0 refills | Status: DC | PRN
Start: 2017-06-19 — End: 2017-06-21
  Administered 2017-06-19 – 2017-06-21 (×5): 1 mg via ORAL

## 2017-06-19 MED ORDER — NITROGLYCERIN 0.4 MG SL SUBL
.4 mg | SUBLINGUAL | 0 refills | Status: AC | PRN
Start: 2017-06-19 — End: ?

## 2017-06-19 MED ORDER — REGADENOSON 0.4 MG/5 ML IV SYRG
.4 mg | Freq: Once | INTRAVENOUS | 0 refills | Status: CP
Start: 2017-06-19 — End: ?
  Administered 2017-06-19: 14:00:00 0.4 mg via INTRAVENOUS

## 2017-06-20 ENCOUNTER — Inpatient Hospital Stay: Admit: 2017-06-20 | Discharge: 2017-06-20 | Payer: MEDICAID

## 2017-06-20 LAB — POC GLUCOSE
Lab: 154 mg/dL — ABNORMAL HIGH (ref 70–100)
Lab: 101 mg/dL — ABNORMAL HIGH (ref >60)
Lab: 146 mg/dL — ABNORMAL HIGH (ref 70–100)
Lab: 145 mg/dL — ABNORMAL HIGH (ref 70–100)

## 2017-06-20 LAB — TROPONIN-I

## 2017-06-20 LAB — CBC: Lab: 5.7 K/UL — ABNORMAL LOW (ref >60)

## 2017-06-20 LAB — PHOSPHORUS: Lab: 4.3 mg/dL — ABNORMAL HIGH (ref >60)

## 2017-06-20 LAB — MAGNESIUM: Lab: 1.9 mg/dL — ABNORMAL LOW (ref 1.6–2.6)

## 2017-06-20 LAB — COMPREHENSIVE METABOLIC PANEL
Lab: 137 MMOL/L — ABNORMAL LOW (ref >60)
Lab: 4.4 MMOL/L — ABNORMAL LOW (ref >60)

## 2017-06-20 MED ORDER — FUROSEMIDE 10 MG/ML IJ SOLN
80 mg | INTRAVENOUS | 0 refills | Status: DC
Start: 2017-06-20 — End: 2017-06-22
  Administered 2017-06-20 – 2017-06-22 (×5): 80 mg via INTRAVENOUS

## 2017-06-20 MED ORDER — POLYETHYLENE GLYCOL 3350 17 GRAM PO PWPK
1 | Freq: Two times a day (BID) | ORAL | 0 refills | Status: DC
Start: 2017-06-20 — End: 2017-06-29
  Administered 2017-06-21 – 2017-06-27 (×9): 17 g via ORAL

## 2017-06-20 MED ORDER — SENNOSIDES 8.6 MG PO TAB
1 | Freq: Two times a day (BID) | ORAL | 0 refills | Status: DC
Start: 2017-06-20 — End: 2017-06-29
  Administered 2017-06-21 – 2017-06-28 (×15): 1 via ORAL

## 2017-06-21 LAB — CBC: Lab: 6 K/UL (ref >60)

## 2017-06-21 LAB — COMPREHENSIVE METABOLIC PANEL: Lab: 137 MMOL/L — ABNORMAL LOW (ref >60)

## 2017-06-21 LAB — POC GLUCOSE
Lab: 195 mg/dL — ABNORMAL HIGH (ref 70–100)
Lab: 141 mg/dL — ABNORMAL HIGH (ref 70–100)
Lab: 86 mg/dL (ref 70–100)
Lab: 155 mg/dL — ABNORMAL HIGH (ref 70–100)

## 2017-06-21 LAB — URINALYSIS MICROSCOPIC REFLEX TO CULTURE

## 2017-06-21 LAB — C3 COMPLEMENT 3: Lab: 149 mg/dL (ref 88–200)

## 2017-06-21 LAB — C4 COMPLEMENT 4: Lab: 46 mg/dL — ABNORMAL LOW (ref 10–49)

## 2017-06-21 LAB — PHOSPHORUS: Lab: 5.3 mg/dL — ABNORMAL HIGH (ref >60)

## 2017-06-21 LAB — MAGNESIUM: Lab: 1.8 mg/dL — ABNORMAL LOW (ref 1.6–2.6)

## 2017-06-21 LAB — URINALYSIS DIPSTICK REFLEX TO CULTURE

## 2017-06-21 MED ORDER — DAPTOMYCIN SYR
6 mg/kg | INTRAVENOUS | 0 refills | Status: DC
Start: 2017-06-21 — End: 2017-06-24
  Administered 2017-06-23: 03:00:00 600 mg via INTRAVENOUS

## 2017-06-21 MED ORDER — TRAZODONE 50 MG PO TAB
50-100 mg | Freq: Every evening | ORAL | 0 refills | Status: DC
Start: 2017-06-21 — End: 2017-06-21

## 2017-06-21 MED ORDER — TRAZODONE 50 MG PO TAB
25 mg | Freq: Three times a day (TID) | ORAL | 0 refills | Status: DC | PRN
Start: 2017-06-21 — End: 2017-06-21

## 2017-06-21 MED ORDER — LORAZEPAM 1 MG PO TAB
1 mg | Freq: Two times a day (BID) | ORAL | 0 refills | Status: DC | PRN
Start: 2017-06-21 — End: 2017-06-22
  Administered 2017-06-22 (×2): 1 mg via ORAL

## 2017-06-22 LAB — COMPREHENSIVE METABOLIC PANEL: Lab: 135 MMOL/L — ABNORMAL LOW (ref 137–147)

## 2017-06-22 LAB — POC GLUCOSE
Lab: 220 mg/dL — ABNORMAL HIGH (ref 70–100)
Lab: 168 mg/dL — ABNORMAL HIGH (ref 70–100)
Lab: 170 mg/dL — ABNORMAL HIGH (ref 70–100)
Lab: 184 mg/dL — ABNORMAL HIGH (ref 70–100)

## 2017-06-22 LAB — CULTURE-WOUND/TISSUE/FLUID(AEROBIC ONLY)W/SENSITIVITY

## 2017-06-22 LAB — PHOSPHORUS: Lab: 5.6 mg/dL — ABNORMAL HIGH (ref 2.0–4.5)

## 2017-06-22 LAB — MAGNESIUM: Lab: 1.9 mg/dL — ABNORMAL LOW (ref 1.6–2.6)

## 2017-06-22 LAB — CBC: Lab: 6.5 K/UL — ABNORMAL LOW (ref >60)

## 2017-06-22 MED ORDER — LORAZEPAM 1 MG PO TAB
1 mg | Freq: Three times a day (TID) | ORAL | 0 refills | Status: DC | PRN
Start: 2017-06-22 — End: 2017-06-29
  Administered 2017-06-22 – 2017-06-29 (×14): 1 mg via ORAL

## 2017-06-22 MED ORDER — LEVOFLOXACIN 250 MG/10 ML PO SOLN
750 mg | ORAL | 0 refills | Status: DC
Start: 2017-06-22 — End: 2017-06-22

## 2017-06-22 MED ORDER — CEFTRIAXONE INJ 1GM IVP
1 g | INTRAVENOUS | 0 refills | Status: DC
Start: 2017-06-22 — End: 2017-06-29
  Administered 2017-06-23 – 2017-06-28 (×7): 1 g via INTRAVENOUS

## 2017-06-23 LAB — POC GLUCOSE
Lab: 245 mg/dL — ABNORMAL HIGH (ref 70–100)
Lab: 198 mg/dL — ABNORMAL HIGH (ref 70–100)
Lab: 210 mg/dL — ABNORMAL HIGH (ref 70–100)
Lab: 168 mg/dL — ABNORMAL HIGH (ref 70–100)
Lab: 249 mg/dL — ABNORMAL HIGH (ref 70–100)

## 2017-06-23 LAB — CBC: Lab: 5.4 10*3/uL — ABNORMAL LOW (ref 4.5–11.0)

## 2017-06-23 LAB — PHOSPHORUS: Lab: 5.9 mg/dL — ABNORMAL HIGH (ref >60)

## 2017-06-23 LAB — COMPREHENSIVE METABOLIC PANEL
Lab: 135 MMOL/L — ABNORMAL LOW (ref >60)
Lab: 53 mg/dL — ABNORMAL HIGH (ref >60)

## 2017-06-23 LAB — MAGNESIUM: Lab: 1.9 mg/dL — ABNORMAL LOW (ref 1.6–2.6)

## 2017-06-24 LAB — COMPREHENSIVE METABOLIC PANEL: Lab: 134 MMOL/L — ABNORMAL LOW (ref >60)

## 2017-06-24 LAB — CULTURE-BLOOD W/SENSITIVITY

## 2017-06-24 LAB — POC GLUCOSE
Lab: 267 mg/dL — ABNORMAL HIGH (ref 70–100)
Lab: 132 mg/dL — ABNORMAL HIGH (ref 70–100)
Lab: 146 mg/dL — ABNORMAL HIGH (ref 70–100)
Lab: 199 mg/dL — ABNORMAL HIGH (ref 70–100)

## 2017-06-24 LAB — CREATINE KINASE-CPK: Lab: 294 U/L — ABNORMAL HIGH (ref >60)

## 2017-06-24 LAB — PHOSPHORUS: Lab: 5.3 mg/dL — ABNORMAL HIGH (ref >60)

## 2017-06-24 LAB — CBC: Lab: 4.7 K/UL (ref 4.5–11.0)

## 2017-06-24 LAB — MAGNESIUM: Lab: 2.1 mg/dL — ABNORMAL LOW (ref >60)

## 2017-06-24 MED ORDER — FUROSEMIDE 10 MG/ML IJ SOLN
40 mg | Freq: Two times a day (BID) | INTRAVENOUS | 0 refills | Status: DC
Start: 2017-06-24 — End: 2017-06-25
  Administered 2017-06-24 – 2017-06-25 (×2): 40 mg via INTRAVENOUS

## 2017-06-25 LAB — POC GLUCOSE
Lab: 198 mg/dL — ABNORMAL HIGH (ref 70–100)
Lab: 207 mg/dL — ABNORMAL HIGH (ref 70–100)
Lab: 211 mg/dL — ABNORMAL HIGH (ref 70–100)
Lab: 177 mg/dL — ABNORMAL HIGH (ref 70–100)
Lab: 191 mg/dL — ABNORMAL HIGH (ref 70–100)

## 2017-06-25 LAB — BASIC METABOLIC PANEL
Lab: 133 MMOL/L — ABNORMAL LOW (ref >60)
Lab: 18 mL/min — ABNORMAL LOW (ref >60)
Lab: 182 mg/dL — ABNORMAL HIGH (ref 70–100)
Lab: 21 mL/min — ABNORMAL LOW (ref >60)
Lab: 27 MMOL/L — ABNORMAL HIGH (ref 74–106)
Lab: 3.6 mg/dL — ABNORMAL HIGH (ref 0.4–1.24)
Lab: 5.2 MMOL/L — ABNORMAL HIGH (ref 7.0–18.0)
Lab: 60 mg/dL — ABNORMAL HIGH (ref 7–25)
Lab: 9 (ref 3–12)
Lab: 9.9 mg/dL (ref 8.5–10.6)
Lab: 97 MMOL/L — ABNORMAL LOW (ref 98–110)

## 2017-06-25 LAB — CBC
Lab: 13 % (ref 11–15)
Lab: 28 % — ABNORMAL LOW (ref 40–50)
Lab: 28 pg (ref 26–34)
Lab: 288 10*3/uL (ref 150–400)
Lab: 3.2 M/UL — ABNORMAL LOW (ref 4.4–5.5)
Lab: 32 g/dL (ref 32.0–36.0)
Lab: 6.3 10*3/uL (ref 4.5–11.0)
Lab: 86 FL (ref 80–100)
Lab: 9.2 g/dL — ABNORMAL LOW (ref 13.5–16.5)
Lab: 9.5 FL (ref 7–11)

## 2017-06-25 LAB — HEMOGLOBIN A1C: Lab: 8.8 % — ABNORMAL HIGH (ref >60)

## 2017-06-25 LAB — PSA SCREEN: Lab: 0.1 ng/mL (ref <1.51)

## 2017-06-26 LAB — BASIC METABOLIC PANEL: Lab: 134 MMOL/L — ABNORMAL LOW (ref 137–147)

## 2017-06-26 LAB — POC GLUCOSE
Lab: 227 mg/dL — ABNORMAL HIGH (ref 70–100)
Lab: 188 mg/dL — ABNORMAL HIGH (ref 70–100)
Lab: 198 mg/dL — ABNORMAL HIGH (ref 70–100)
Lab: 241 mg/dL — ABNORMAL HIGH (ref 70–100)

## 2017-06-26 LAB — CBC AND DIFF: Lab: 6.5 K/UL — ABNORMAL HIGH (ref 4.5–11.0)

## 2017-06-27 LAB — POC GLUCOSE
Lab: 229 mg/dL — ABNORMAL HIGH (ref 70–100)
Lab: 209 mg/dL — ABNORMAL HIGH (ref 70–100)
Lab: 176 mg/dL — ABNORMAL HIGH (ref 70–100)
Lab: 189 mg/dL — ABNORMAL HIGH (ref 70–100)

## 2017-06-27 LAB — CBC AND DIFF: Lab: 5.9 K/UL (ref 4.5–11.0)

## 2017-06-27 LAB — BASIC METABOLIC PANEL: Lab: 132 MMOL/L — ABNORMAL LOW (ref 137–147)

## 2017-06-28 ENCOUNTER — Encounter: Admit: 2017-06-28 | Discharge: 2017-06-28 | Payer: Medicaid Other

## 2017-06-28 LAB — POC GLUCOSE
Lab: 265 mg/dL — ABNORMAL HIGH (ref 70–100)
Lab: 198 mg/dL — ABNORMAL HIGH (ref 70–100)
Lab: 243 mg/dL — ABNORMAL HIGH (ref 70–100)
Lab: 260 mg/dL — ABNORMAL HIGH (ref >60)
Lab: 131 mg/dL — ABNORMAL HIGH (ref 70–100)

## 2017-06-28 LAB — BASIC METABOLIC PANEL
Lab: 134 MMOL/L — ABNORMAL LOW (ref 137–147)
Lab: 99 MMOL/L — ABNORMAL LOW (ref 98–110)

## 2017-06-28 LAB — CBC AND DIFF: Lab: 6.7 K/UL — ABNORMAL HIGH (ref 4.5–11.0)

## 2017-06-29 ENCOUNTER — Inpatient Hospital Stay: Admit: 2017-06-18 | Discharge: 2017-06-18 | Payer: MEDICAID

## 2017-06-29 ENCOUNTER — Inpatient Hospital Stay
Admit: 2017-06-18 | Discharge: 2017-06-29 | Disposition: A | Discharge status: Home / Self Care | Payer: Medicaid Other | Source: Other Acute Inpatient Hospital

## 2017-06-29 ENCOUNTER — Inpatient Hospital Stay: Admit: 2017-06-17 | Discharge: 2017-06-17 | Payer: MEDICAID

## 2017-06-29 ENCOUNTER — Inpatient Hospital Stay: Admit: 2017-06-17 | Discharge: 2017-06-17 | Payer: Medicaid Other

## 2017-06-29 ENCOUNTER — Inpatient Hospital Stay: Admit: 2017-06-20 | Discharge: 2017-06-20 | Payer: MEDICAID

## 2017-06-29 ENCOUNTER — Encounter: Admit: 2017-06-29 | Discharge: 2017-06-29 | Payer: MEDICAID

## 2017-06-29 ENCOUNTER — Inpatient Hospital Stay: Admit: 2017-06-19 | Discharge: 2017-06-19 | Payer: MEDICAID

## 2017-06-29 ENCOUNTER — Inpatient Hospital Stay: Admit: 2017-06-20 | Discharge: 2017-06-20 | Payer: Medicaid Other

## 2017-06-29 DIAGNOSIS — Z794 Long term (current) use of insulin: ICD-10-CM

## 2017-06-29 DIAGNOSIS — E1165 Type 2 diabetes mellitus with hyperglycemia: ICD-10-CM

## 2017-06-29 DIAGNOSIS — G4733 Obstructive sleep apnea (adult) (pediatric): ICD-10-CM

## 2017-06-29 DIAGNOSIS — M545 Low back pain: ICD-10-CM

## 2017-06-29 DIAGNOSIS — Z23 Encounter for immunization: ICD-10-CM

## 2017-06-29 DIAGNOSIS — Z915 Personal history of self-harm: ICD-10-CM

## 2017-06-29 DIAGNOSIS — N412 Abscess of prostate: ICD-10-CM

## 2017-06-29 DIAGNOSIS — F411 Generalized anxiety disorder: ICD-10-CM

## 2017-06-29 DIAGNOSIS — N3946 Mixed incontinence: ICD-10-CM

## 2017-06-29 DIAGNOSIS — Z6841 Body Mass Index (BMI) 40.0 and over, adult: ICD-10-CM

## 2017-06-29 DIAGNOSIS — N390 Urinary tract infection, site not specified: ICD-10-CM

## 2017-06-29 DIAGNOSIS — Z79899 Other long term (current) drug therapy: ICD-10-CM

## 2017-06-29 DIAGNOSIS — F4323 Adjustment disorder with mixed anxiety and depressed mood: ICD-10-CM

## 2017-06-29 DIAGNOSIS — B9620 Unspecified Escherichia coli [E. coli] as the cause of diseases classified elsewhere: ICD-10-CM

## 2017-06-29 DIAGNOSIS — G8929 Other chronic pain: ICD-10-CM

## 2017-06-29 DIAGNOSIS — E8770 Fluid overload, unspecified: ICD-10-CM

## 2017-06-29 DIAGNOSIS — Z7984 Long term (current) use of oral hypoglycemic drugs: ICD-10-CM

## 2017-06-29 DIAGNOSIS — Z8614 Personal history of Methicillin resistant Staphylococcus aureus infection: ICD-10-CM

## 2017-06-29 DIAGNOSIS — F064 Anxiety disorder due to known physiological condition: ICD-10-CM

## 2017-06-29 DIAGNOSIS — H6091 Unspecified otitis externa, right ear: ICD-10-CM

## 2017-06-29 DIAGNOSIS — N170 Acute kidney failure with tubular necrosis: Principal | ICD-10-CM

## 2017-06-29 DIAGNOSIS — Z720 Tobacco use: ICD-10-CM

## 2017-06-29 DIAGNOSIS — T368X5A Adverse effect of other systemic antibiotics, initial encounter: ICD-10-CM

## 2017-06-29 DIAGNOSIS — I10 Essential (primary) hypertension: ICD-10-CM

## 2017-06-29 DIAGNOSIS — J449 Chronic obstructive pulmonary disease, unspecified: ICD-10-CM

## 2017-06-29 DIAGNOSIS — R45851 Suicidal ideations: ICD-10-CM

## 2017-06-29 DIAGNOSIS — I4510 Unspecified right bundle-branch block: ICD-10-CM

## 2017-06-29 DIAGNOSIS — Z9079 Acquired absence of other genital organ(s): ICD-10-CM

## 2017-06-29 DIAGNOSIS — F332 Major depressive disorder, recurrent severe without psychotic features: ICD-10-CM

## 2017-06-29 LAB — POC GLUCOSE: Lab: 253 mg/dL — ABNORMAL HIGH (ref 70–100)

## 2017-06-29 LAB — BASIC METABOLIC PANEL: Lab: 134 MMOL/L — ABNORMAL LOW (ref 137–147)

## 2017-06-29 LAB — CBC AND DIFF: Lab: 5.8 K/UL — ABNORMAL LOW (ref >60)

## 2017-06-29 MED ORDER — CIPROFLOXACIN-DEXAMETHASONE 0.3-0.1 % OT DRPS
4 [drp] | Freq: Two times a day (BID) | OTIC | 0 refills | 7.00000 days | Status: AC
Start: 2017-06-29 — End: ?
  Filled 2017-06-29 (×2): qty 7, 19d supply, fill #1

## 2017-06-29 MED ORDER — METHOCARBAMOL 750 MG PO TAB
750 mg | ORAL_TABLET | Freq: Two times a day (BID) | ORAL | 1 refills | Status: AC
Start: 2017-06-29 — End: 2017-12-02
  Filled 2017-06-29 (×2): qty 60, 30d supply, fill #1

## 2017-06-29 MED ORDER — TRAZODONE 50 MG PO TAB
50-100 mg | ORAL_TABLET | Freq: Every evening | ORAL | 1 refills | Status: AC | PRN
Start: 2017-06-29 — End: 2017-12-02
  Filled 2017-06-29 (×2): qty 60, 30d supply, fill #1

## 2017-06-29 MED ORDER — ESCITALOPRAM OXALATE 5 MG PO TAB
5 mg | ORAL_TABLET | Freq: Every day | ORAL | 3 refills | Status: AC
Start: 2017-06-29 — End: 2017-12-02
  Filled 2017-06-29 (×2): qty 30, 30d supply, fill #1

## 2017-07-08 ENCOUNTER — Ambulatory Visit: Admit: 2017-07-08 | Discharge: 2017-07-08 | Payer: Medicaid Other

## 2017-07-08 ENCOUNTER — Encounter: Admit: 2017-07-08 | Discharge: 2017-07-08 | Payer: Medicaid Other

## 2017-07-08 ENCOUNTER — Encounter: Admit: 2017-07-08 | Discharge: 2017-07-08 | Payer: MEDICAID

## 2017-07-08 DIAGNOSIS — I10 Essential (primary) hypertension: ICD-10-CM

## 2017-07-08 DIAGNOSIS — R42 Dizziness and giddiness: Principal | ICD-10-CM

## 2017-07-08 DIAGNOSIS — R21 Rash and other nonspecific skin eruption: ICD-10-CM

## 2017-07-08 DIAGNOSIS — G4730 Sleep apnea, unspecified: Principal | ICD-10-CM

## 2017-07-08 DIAGNOSIS — J45909 Unspecified asthma, uncomplicated: ICD-10-CM

## 2017-07-08 DIAGNOSIS — H60391 Other infective otitis externa, right ear: Principal | ICD-10-CM

## 2017-07-08 DIAGNOSIS — N412 Abscess of prostate: Principal | ICD-10-CM

## 2017-07-08 DIAGNOSIS — E669 Obesity, unspecified: ICD-10-CM

## 2017-07-08 DIAGNOSIS — E119 Type 2 diabetes mellitus without complications: ICD-10-CM

## 2017-07-08 DIAGNOSIS — H9191 Unspecified hearing loss, right ear: ICD-10-CM

## 2017-07-08 DIAGNOSIS — J449 Chronic obstructive pulmonary disease, unspecified: ICD-10-CM

## 2017-07-08 LAB — POTASSIUM: Lab: 4.3

## 2017-07-08 LAB — BUN: Lab: 35

## 2017-07-08 LAB — CREATININE: Lab: 1.7

## 2017-07-08 MED ORDER — NYSTATIN 100,000 UNIT/GRAM TP POWD
Freq: Four times a day (QID) | TOPICAL | 3 refills | Status: SS
Start: 2017-07-08 — End: 2018-07-14

## 2017-07-09 ENCOUNTER — Encounter: Admit: 2017-07-09 | Discharge: 2017-07-09 | Payer: Medicaid Other

## 2017-07-13 ENCOUNTER — Encounter: Admit: 2017-07-13 | Discharge: 2017-07-13 | Payer: MEDICAID

## 2017-07-13 DIAGNOSIS — R102 Pelvic and perineal pain: ICD-10-CM

## 2017-07-13 DIAGNOSIS — N179 Acute kidney failure, unspecified: Principal | ICD-10-CM

## 2017-07-14 ENCOUNTER — Encounter: Admit: 2017-07-14 | Discharge: 2017-07-14 | Payer: MEDICAID

## 2017-07-15 ENCOUNTER — Encounter: Admit: 2017-07-15 | Discharge: 2017-07-15 | Payer: MEDICAID

## 2017-07-19 LAB — CULTURE-FUNGAL,OTHER

## 2017-07-22 ENCOUNTER — Encounter: Admit: 2017-07-22 | Discharge: 2017-07-22 | Payer: MEDICAID

## 2017-07-28 ENCOUNTER — Encounter: Admit: 2017-07-28 | Discharge: 2017-07-28 | Payer: MEDICAID

## 2017-07-28 DIAGNOSIS — J45909 Unspecified asthma, uncomplicated: ICD-10-CM

## 2017-07-28 DIAGNOSIS — E119 Type 2 diabetes mellitus without complications: ICD-10-CM

## 2017-07-28 DIAGNOSIS — G4730 Sleep apnea, unspecified: Principal | ICD-10-CM

## 2017-07-28 DIAGNOSIS — E669 Obesity, unspecified: ICD-10-CM

## 2017-07-28 DIAGNOSIS — I10 Essential (primary) hypertension: ICD-10-CM

## 2017-07-28 DIAGNOSIS — J449 Chronic obstructive pulmonary disease, unspecified: ICD-10-CM

## 2017-10-15 ENCOUNTER — Encounter: Admit: 2017-10-15 | Discharge: 2017-10-15 | Payer: MEDICAID

## 2017-12-02 ENCOUNTER — Encounter: Admit: 2017-12-02 | Discharge: 2017-12-02 | Payer: Medicaid Other

## 2017-12-02 ENCOUNTER — Ambulatory Visit: Admit: 2017-12-02 | Discharge: 2017-12-03 | Payer: MEDICAID

## 2017-12-02 DIAGNOSIS — R7989 Other specified abnormal findings of blood chemistry: ICD-10-CM

## 2017-12-02 DIAGNOSIS — I10 Essential (primary) hypertension: ICD-10-CM

## 2017-12-02 DIAGNOSIS — J45909 Unspecified asthma, uncomplicated: ICD-10-CM

## 2017-12-02 DIAGNOSIS — J449 Chronic obstructive pulmonary disease, unspecified: ICD-10-CM

## 2017-12-02 DIAGNOSIS — G4730 Sleep apnea, unspecified: Principal | ICD-10-CM

## 2017-12-02 DIAGNOSIS — E119 Type 2 diabetes mellitus without complications: ICD-10-CM

## 2017-12-02 DIAGNOSIS — E669 Obesity, unspecified: ICD-10-CM

## 2017-12-02 MED ORDER — SILDENAFIL 100 MG PO TAB
100 mg | ORAL_TABLET | ORAL | 1 refills | 45.00000 days | Status: AC | PRN
Start: 2017-12-02 — End: ?

## 2017-12-03 DIAGNOSIS — R102 Pelvic and perineal pain: Principal | ICD-10-CM

## 2017-12-04 LAB — CULTURE-URINE W/SENSITIVITY

## 2017-12-09 ENCOUNTER — Encounter: Admit: 2017-12-09 | Discharge: 2017-12-09 | Payer: Medicaid Other

## 2017-12-09 DIAGNOSIS — J449 Chronic obstructive pulmonary disease, unspecified: ICD-10-CM

## 2017-12-09 DIAGNOSIS — R7989 Other specified abnormal findings of blood chemistry: ICD-10-CM

## 2017-12-09 DIAGNOSIS — E119 Type 2 diabetes mellitus without complications: ICD-10-CM

## 2017-12-09 DIAGNOSIS — J45909 Unspecified asthma, uncomplicated: ICD-10-CM

## 2017-12-09 DIAGNOSIS — G4730 Sleep apnea, unspecified: Principal | ICD-10-CM

## 2017-12-09 DIAGNOSIS — E669 Obesity, unspecified: ICD-10-CM

## 2017-12-09 DIAGNOSIS — I10 Essential (primary) hypertension: ICD-10-CM

## 2017-12-10 ENCOUNTER — Ambulatory Visit: Admit: 2017-12-10 | Discharge: 2017-12-11 | Payer: MEDICAID

## 2017-12-10 ENCOUNTER — Encounter: Admit: 2017-12-10 | Discharge: 2017-12-10 | Payer: Medicaid Other

## 2017-12-10 DIAGNOSIS — G4730 Sleep apnea, unspecified: Principal | ICD-10-CM

## 2017-12-10 DIAGNOSIS — J45909 Unspecified asthma, uncomplicated: ICD-10-CM

## 2017-12-10 DIAGNOSIS — E119 Type 2 diabetes mellitus without complications: ICD-10-CM

## 2017-12-10 DIAGNOSIS — E669 Obesity, unspecified: ICD-10-CM

## 2017-12-10 DIAGNOSIS — R7989 Other specified abnormal findings of blood chemistry: ICD-10-CM

## 2017-12-10 DIAGNOSIS — I10 Essential (primary) hypertension: ICD-10-CM

## 2017-12-10 DIAGNOSIS — J449 Chronic obstructive pulmonary disease, unspecified: ICD-10-CM

## 2017-12-10 MED ORDER — ATORVASTATIN 10 MG PO TAB
10 mg | ORAL_TABLET | Freq: Every day | ORAL | 3 refills | Status: SS
Start: 2017-12-10 — End: 2018-07-14

## 2017-12-10 MED ORDER — INSULIN GLARGINE U-300 CONC 300 UNIT/ML (3 ML) SC INPN
50 [IU] | Freq: Every day | SUBCUTANEOUS | 3 refills | 60.00000 days | Status: AC
Start: 2017-12-10 — End: 2018-06-03

## 2017-12-10 MED ORDER — METFORMIN 1,000 MG PO TAB
1000 mg | ORAL_TABLET | Freq: Two times a day (BID) | ORAL | 3 refills | Status: SS
Start: 2017-12-10 — End: 2018-07-14

## 2017-12-10 MED ORDER — LISINOPRIL 5 MG PO TAB
5 mg | ORAL_TABLET | Freq: Every day | ORAL | 3 refills | Status: AC
Start: 2017-12-10 — End: ?

## 2017-12-11 DIAGNOSIS — I10 Essential (primary) hypertension: ICD-10-CM

## 2017-12-11 DIAGNOSIS — E1165 Type 2 diabetes mellitus with hyperglycemia: Principal | ICD-10-CM

## 2017-12-11 DIAGNOSIS — E1065 Type 1 diabetes mellitus with hyperglycemia: ICD-10-CM

## 2017-12-11 DIAGNOSIS — E785 Hyperlipidemia, unspecified: ICD-10-CM

## 2017-12-11 DIAGNOSIS — E039 Hypothyroidism, unspecified: ICD-10-CM

## 2017-12-21 ENCOUNTER — Encounter: Admit: 2017-12-21 | Discharge: 2017-12-21 | Payer: MEDICAID

## 2017-12-22 ENCOUNTER — Encounter: Admit: 2017-12-22 | Discharge: 2017-12-22 | Payer: Medicaid Other

## 2018-02-21 ENCOUNTER — Encounter: Admit: 2018-02-21 | Discharge: 2018-02-21 | Payer: MEDICAID

## 2018-05-22 ENCOUNTER — Encounter: Admit: 2018-05-22 | Discharge: 2018-05-22 | Payer: MEDICAID

## 2018-06-03 ENCOUNTER — Encounter: Admit: 2018-06-03 | Discharge: 2018-06-03 | Payer: MEDICAID

## 2018-06-03 ENCOUNTER — Encounter: Admit: 2018-06-03 | Discharge: 2018-06-03 | Payer: Medicaid Other

## 2018-06-03 ENCOUNTER — Ambulatory Visit: Admit: 2018-06-03 | Discharge: 2018-06-03 | Payer: MEDICAID

## 2018-06-03 DIAGNOSIS — E669 Obesity, unspecified: ICD-10-CM

## 2018-06-03 DIAGNOSIS — J45909 Unspecified asthma, uncomplicated: ICD-10-CM

## 2018-06-03 DIAGNOSIS — I10 Essential (primary) hypertension: ICD-10-CM

## 2018-06-03 DIAGNOSIS — E1165 Type 2 diabetes mellitus with hyperglycemia: Principal | ICD-10-CM

## 2018-06-03 DIAGNOSIS — G4730 Sleep apnea, unspecified: Principal | ICD-10-CM

## 2018-06-03 DIAGNOSIS — J449 Chronic obstructive pulmonary disease, unspecified: ICD-10-CM

## 2018-06-03 DIAGNOSIS — E785 Hyperlipidemia, unspecified: Secondary | ICD-10-CM

## 2018-06-03 DIAGNOSIS — E119 Type 2 diabetes mellitus without complications: ICD-10-CM

## 2018-06-03 DIAGNOSIS — R7989 Other specified abnormal findings of blood chemistry: ICD-10-CM

## 2018-06-14 LAB — COMPREHENSIVE METABOLIC PANEL: Lab: 135 — ABNORMAL LOW (ref 136–145)

## 2018-06-14 LAB — LIPID PROFILE
Lab: 265 — ABNORMAL HIGH (ref <200)
Lab: 49

## 2018-06-14 LAB — THYROID STIMULATING HORMONE-TSH: Lab: 1 — ABNORMAL HIGH (ref <150)

## 2018-06-14 LAB — CBC: Lab: 6.8

## 2018-06-24 ENCOUNTER — Encounter: Admit: 2018-06-24 | Discharge: 2018-06-24 | Payer: MEDICAID

## 2018-06-27 ENCOUNTER — Encounter: Admit: 2018-06-27 | Discharge: 2018-06-27 | Payer: Medicaid Other

## 2018-06-27 ENCOUNTER — Encounter: Admit: 2018-06-27 | Discharge: 2018-06-27 | Payer: MEDICAID

## 2018-06-27 ENCOUNTER — Ambulatory Visit: Admit: 2018-06-27 | Discharge: 2018-06-28 | Payer: MEDICAID

## 2018-06-27 DIAGNOSIS — G4730 Sleep apnea, unspecified: Principal | ICD-10-CM

## 2018-06-27 DIAGNOSIS — R7989 Other specified abnormal findings of blood chemistry: ICD-10-CM

## 2018-06-27 DIAGNOSIS — E119 Type 2 diabetes mellitus without complications: ICD-10-CM

## 2018-06-27 DIAGNOSIS — G4733 Obstructive sleep apnea (adult) (pediatric): ICD-10-CM

## 2018-06-27 DIAGNOSIS — E1140 Type 2 diabetes mellitus with diabetic neuropathy, unspecified: ICD-10-CM

## 2018-06-27 DIAGNOSIS — J449 Chronic obstructive pulmonary disease, unspecified: ICD-10-CM

## 2018-06-27 DIAGNOSIS — Z6841 Body Mass Index (BMI) 40.0 and over, adult: Secondary | ICD-10-CM

## 2018-06-27 DIAGNOSIS — E669 Obesity, unspecified: ICD-10-CM

## 2018-06-27 DIAGNOSIS — I10 Essential (primary) hypertension: ICD-10-CM

## 2018-06-27 DIAGNOSIS — J45909 Unspecified asthma, uncomplicated: ICD-10-CM

## 2018-06-27 MED ORDER — CARVEDILOL 25 MG PO TAB
25 mg | ORAL_TABLET | Freq: Two times a day (BID) | ORAL | 3 refills | 90.00000 days | Status: DC
Start: 2018-06-27 — End: 2019-04-14

## 2018-06-28 DIAGNOSIS — Z8249 Family history of ischemic heart disease and other diseases of the circulatory system: ICD-10-CM

## 2018-06-28 DIAGNOSIS — Z794 Long term (current) use of insulin: ICD-10-CM

## 2018-06-28 DIAGNOSIS — I200 Unstable angina: ICD-10-CM

## 2018-06-28 DIAGNOSIS — R0789 Other chest pain: Principal | ICD-10-CM

## 2018-06-28 DIAGNOSIS — E1165 Type 2 diabetes mellitus with hyperglycemia: Secondary | ICD-10-CM

## 2018-06-28 DIAGNOSIS — Z87891 Personal history of nicotine dependence: ICD-10-CM

## 2018-06-28 DIAGNOSIS — I10 Essential (primary) hypertension: ICD-10-CM

## 2018-07-05 ENCOUNTER — Encounter: Admit: 2018-07-05 | Discharge: 2018-07-05 | Payer: Medicaid Other

## 2018-07-07 ENCOUNTER — Ambulatory Visit: Admit: 2018-07-07 | Discharge: 2018-07-08 | Payer: MEDICAID

## 2018-07-07 ENCOUNTER — Encounter: Admit: 2018-07-07 | Discharge: 2018-07-07 | Payer: MEDICAID

## 2018-07-07 DIAGNOSIS — R079 Chest pain, unspecified: Principal | ICD-10-CM

## 2018-07-07 MED ORDER — AMINOPHYLLINE 500 MG/20 ML IV SOLN
50 mg | INTRAVENOUS | 0 refills | Status: AC | PRN
Start: 2018-07-07 — End: ?
  Administered 2018-07-07: 16:00:00 50 mg via INTRAVENOUS

## 2018-07-07 MED ORDER — ALBUTEROL SULFATE 90 MCG/ACTUATION IN HFAA
2 | RESPIRATORY_TRACT | 0 refills | Status: DC | PRN
Start: 2018-07-07 — End: 2018-07-12

## 2018-07-07 MED ORDER — NITROGLYCERIN 0.4 MG SL SUBL
.4 mg | SUBLINGUAL | 0 refills | Status: DC | PRN
Start: 2018-07-07 — End: 2018-07-12
  Administered 2018-07-07: 16:00:00 0.4 mg via SUBLINGUAL

## 2018-07-07 MED ORDER — EUCALYPTUS-MENTHOL MM LOZG
1 | Freq: Once | ORAL | 0 refills | Status: AC | PRN
Start: 2018-07-07 — End: ?

## 2018-07-07 MED ORDER — SODIUM CHLORIDE 0.9 % IV SOLP
250 mL | INTRAVENOUS | 0 refills | Status: AC | PRN
Start: 2018-07-07 — End: ?

## 2018-07-07 MED ORDER — REGADENOSON 0.4 MG/5 ML IV SYRG
.4 mg | Freq: Once | INTRAVENOUS | 0 refills | Status: CP
Start: 2018-07-07 — End: ?
  Administered 2018-07-07: 16:00:00 0.4 mg via INTRAVENOUS

## 2018-07-08 ENCOUNTER — Encounter: Admit: 2018-07-08 | Discharge: 2018-07-08 | Payer: MEDICAID

## 2018-07-08 DIAGNOSIS — R9439 Abnormal result of other cardiovascular function study: Principal | ICD-10-CM

## 2018-07-08 DIAGNOSIS — I200 Unstable angina: Principal | ICD-10-CM

## 2018-07-08 DIAGNOSIS — Z1159 Encounter for screening for other viral diseases: ICD-10-CM

## 2018-07-08 DIAGNOSIS — R079 Chest pain, unspecified: Principal | ICD-10-CM

## 2018-07-08 MED ORDER — DOCUSATE SODIUM 100 MG PO CAP
100 mg | Freq: Every day | ORAL | 0 refills | Status: CN | PRN
Start: 2018-07-08 — End: ?

## 2018-07-08 MED ORDER — NITROGLYCERIN 0.4 MG SL SUBL
.4 mg | SUBLINGUAL | 0 refills | Status: CN | PRN
Start: 2018-07-08 — End: ?

## 2018-07-08 MED ORDER — TEMAZEPAM 15 MG PO CAP
15 mg | Freq: Every evening | ORAL | 0 refills | Status: CN | PRN
Start: 2018-07-08 — End: ?

## 2018-07-08 MED ORDER — ALUMINUM-MAGNESIUM HYDROXIDE 200-200 MG/5 ML PO SUSP
30 mL | ORAL | 0 refills | Status: CN | PRN
Start: 2018-07-08 — End: ?

## 2018-07-08 MED ORDER — ACETAMINOPHEN 325 MG PO TAB
650 mg | ORAL | 0 refills | Status: CN | PRN
Start: 2018-07-08 — End: ?

## 2018-07-08 NOTE — Patient Instructions
CARDIAC CATHETERIZATION   PRE-ADMISSION INSTRUCTIONS    Patient Name: Thomas Francis  MRN#: [id number]  Date of Birth: [birth year] (48 y.o.)  Today's Date: 07/08/2018    PROCEDURE:  You are scheduled for a Coronary Angiogram with possible Angioplasty/Stenting with Dr. Myriam Jacobson.    PROCEDURE DATE AND ARRIVAL TIME:    Your procedure date is 07/14/2018.  You will receive a call from the Cath lab staff between 8:00 a.m. and noon on the business day prior to your procedure to let you know at what time to arrive on the day of your procedure.    Please check in at the Admitting Desk in the [facility] for your procedure. [facility] Entrance and and take a right. Continue down the hallway past [facility] office. That hall will take [facility] into the [facility]. Check in at the desk on the left side.)     (If you have further questions regarding your arrival time for the CV lab, please call [phone] by 3:00pm the day before your procedure. Please leave a message with your name and number, your call will be returned in a timely manner.)    PRE-PROCEDURE APPOINTMENTS:  DONE Office visit to update history and physical (requirement within 30 days of procedure)  with Dr. Harrie Foreman at [facility] Tele-health Visit   07/12/18 Pre-Admission lab work required within 14 days of procedure: CBC, Magnesium and CMP at the [facility] [place] clinic.          SPECIAL MEDICATION INSTRUCTIONS  Nothing to eat after midnight before your procedure. No caffeine for 24 hours prior to your procedure. You will be under moderate sedation for your procedure.  You may drink clear liquids up to an hour before hospital arrival. This will be confirmed by the Cath lab staff the day before your procedure.       Any new prescriptions will be sent to your pharmacy listed on file with [place].   HOLD ALL over the counter vitamins or supplements on the morning of your procedure. fluticasone propionate (FLONASE) 50 mcg/actuation nasal spray  Apply 1 spray to each nostril as directed twice daily. Shake bottle gently before using.             fluticasone propionate (FLOVENT HFA) 110 mcg/actuation inhaler  Inhale 2 puffs by mouth into the lungs twice daily.             fluticasone-salmeterol (ADVAIR DISKUS) 250-50 mcg inhalation disk  Inhale one puff by mouth into the lungs twice daily.             gabapentin (NEURONTIN) 400 mg capsule  Take two capsules by mouth every 8 hours.             glucosamine(+) 500 mg tab  Take one tablet by mouth daily.             guaiFENesin LA (MUCINEX) 600 mg tablet  Take one tablet by mouth twice daily.             insulin glargine (LANTUS SOLOSTAR) 100 unit/mL (3 mL) injection PEN  Inject 54 Units under the skin at bedtime daily.             insulin regular (HUMULIN R) 100 unit/mL soln injection  18 u tid with meals and sliding scale.             lactobacillus rhamnosus (GG) (CULTURELLE) 10 billion cell cap  Take one capsule by mouth daily  with breakfast.             levothyroxine (SYNTHROID) 50 mcg tablet  Take 50 mcg by mouth daily 30 minutes before breakfast.             lisinopril (ZESTRIL) 5 mg tablet  Take one tablet by mouth daily.             melatonin 10 mg tab  Take one tablet by mouth at bedtime as needed.             metFORMIN (GLUCOPHAGE) 1,000 mg tablet  Take one tablet by mouth twice daily with meals.             methocarbamoL (ROBAXIN) 750 mg tablet  Take 750 mg by mouth twice daily.             nystatin (NYSTOP) 100,000 unit/g topical powder  Apply  topically to affected area four times daily.             omega-3s-dha-epa-fish oil (FISH OIL) 120 mg-180 mg- 60 mg-1,200 mg cpDR  Take 1 capsule by mouth daily.             oxybutynin chloride (DITROPAN) 5 mg tablet  Take one tablet by mouth three times daily.             oxyCODONE-acetaminophen(+) (PERCOCET) 7.5-325 mg tablet  Take 1-2 tablets by mouth every 6 hours as needed phentermine HCl (PHENTERMINE PO)  Take 37.5 mg by mouth daily.             sildenafil(+) (VIAGRA) 100 mg tablet  Take one tablet by mouth as Needed for Erectile dysfunction. 30-60 min prior to sexual activity             tamsulosin (FLOMAX) 0.4 mg capsule  Take one capsule by mouth twice daily. Do not crush, chew or open capsules. Take 30 minutes following the same meal each day.             testosterone cypionate (DEPO-TESTOSTERONE) 200 mg/mL injection  Inject 200 mg into the muscle every 14 days.             zolpidem (AMBIEN) 10 mg tablet  Take 10 mg by mouth at bedtime as needed for Sleep.               _________________________________________  Form completed by: Sabriyah Wilcher, RN  Date completed: 07/08/18  Method: Via email at pt request

## 2018-07-08 NOTE — Telephone Encounter
-----   Message from Jana Half, MD sent at 07/08/2018  4:09 PM CDT -----  I was able to speak with [name] regarding the results of the stress test.    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    Kellie Shropshire is having fatigue, dyspnea (this is new) and exertional chest pain.    I am recommending LHC,  we will coordinate procedure for next week.    Good discussion with Bay over details of the procedure, he is agreeable to moving forward.

## 2018-07-08 NOTE — Telephone Encounter
07/08/2018 5:38 PM     Discussed with JAF. Pt procedure date is 07/14/18. Will coordinate with pt. MC

## 2018-07-12 ENCOUNTER — Ambulatory Visit: Admit: 2018-07-12 | Discharge: 2018-07-13 | Payer: MEDICAID

## 2018-07-12 ENCOUNTER — Ambulatory Visit: Admit: 2018-07-12 | Discharge: 2018-07-12 | Payer: MEDICAID

## 2018-07-12 ENCOUNTER — Encounter: Admit: 2018-07-12 | Discharge: 2018-07-12 | Payer: MEDICAID

## 2018-07-12 ENCOUNTER — Encounter: Admit: 2018-07-12 | Discharge: 2018-07-12 | Payer: Medicaid Other

## 2018-07-12 DIAGNOSIS — Z1159 Encounter for screening for other viral diseases: Secondary | ICD-10-CM

## 2018-07-12 LAB — CBC
Lab: 4.1 M/UL — ABNORMAL LOW (ref 4.4–5.5)
Lab: 7.3 10*3/uL (ref 4.5–11.0)

## 2018-07-12 LAB — COMPREHENSIVE METABOLIC PANEL
Lab: 0.4 mg/dL (ref 0.3–1.2)
Lab: 1 mg/dL (ref 0.4–1.24)
Lab: 10 (ref 3–12)
Lab: 137 MMOL/L — ABNORMAL LOW (ref 137–147)
Lab: 14 U/L (ref 7–40)
Lab: 15 mg/dL (ref 7–25)
Lab: 167 mg/dL — ABNORMAL HIGH (ref 70–100)
Lab: 19 U/L (ref 7–56)
Lab: 24 MMOL/L (ref 21–30)
Lab: 4.2 g/dL (ref 3.5–5.0)
Lab: 4.4 MMOL/L — ABNORMAL LOW (ref 3.5–5.1)
Lab: 52 U/L (ref 25–110)
Lab: 7.8 g/dL (ref 6.0–8.0)
Lab: 9.5 mg/dL (ref 8.5–10.6)
Lab: >60 mL/min (ref >60)
Lab: >60 mL/min (ref >60)

## 2018-07-12 LAB — MAGNESIUM: Lab: 1.7 mg/dL (ref 1.6–2.6)

## 2018-07-12 NOTE — Progress Notes
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  EF 42%  Chest pain  Hypertension  Hyperlipidemia  Morbid obesity  Insulin-dependent diabetes  Family history of premature coronary artery disease    ____________________________________________________________________________________________________________________________________    History of Present Illness: Thomas Francis is a 48 y.o. male with a history of hypertension, morbid obesity, insulin-dependent diabetes mellitus, family history of premature coronary artery disease, hyperlipidemia who was recently evaluated by Dr. Neale Burly for complaints of chest pain.  He was referred for thallium MPI which was done on 5/21 and was abnormal with reversible perfusion abnormality seen in the inferior and inferolateral segments, concerning for ischemia in the right coronary artery distribution.  EF was 52%.  He was therefore referred for cardiac catheterization to further define his coronary anatomy. He also c/o exertional shortness of breath and fatigue. At times, he wakes up at night and is trying to get a sleep study approved by insurance. He c/o his heart racing with activity.     Patient Active Problem List    Diagnosis Date Noted   ??? Abnormal stress test 07/14/2018   ??? Chest discomfort 06/28/2018   ??? ATN (acute tubular necrosis) (HCC) 06/29/2017   ??? Acute diffuse otitis externa of right ear 06/29/2017   ??? Sleep apnea 06/29/2017 ??? Generalized anxiety disorder 06/29/2017   ??? 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 Morbid obesity with BMI of 50.0-59.9, adult (HCC) 06/17/2017   ??? Bacteremia 06/17/2017   ??? Acute kidney injury (HCC) 06/17/2017   ??? Dyspnea 06/17/2017   ??? S/P TURP 06/17/2017   ??? COPD (chronic obstructive pulmonary disease) (HCC) 06/17/2017   ??? Essential hypertension 05/28/2017   ??? Chronic low back pain 05/28/2017   ??? Major depressive disorder 05/28/2017   ??? Hyperglycemia without ketosis 05/28/2017   ??? Type 2 diabetes mellitus (HCC) 05/27/2017     Medical History:   Diagnosis Date   ??? Asthma    ??? COPD (chronic obstructive pulmonary disease) (HCC)    ??? DM (diabetes mellitus) (HCC)    ??? Hypertension    ??? Low testosterone    ??? Obesity    ??? Sleep apnea       Surgical History:   Procedure Laterality Date   ??? TRANSURETHRAL DRAINAGE ABSCESS - PROSTATE N/A 06/01/2017    Performed by Eveline Keto, MD at [facility] OR   ??? TONSILLECTOMY        Medications Prior to Admission   Medication Sig Dispense Refill Last Dose   ??? aspirin EC 81 mg tablet Take 81 mg by mouth daily. Take with food.   07/14/2018   ??? atorvastatin (LIPITOR) 10 mg tablet Take one tablet by mouth daily. (Patient taking differently: Take 20 mg by mouth daily.) 90 tablet 3 07/13/2018   ??? carvediloL (COREG) 25 mg tablet Take one tablet by mouth twice daily. Take with food. 180 tablet 3 07/14/2018   ??? citalopram (CELEXA) 40 mg tablet Take 40 mg by mouth daily.   07/13/2018   ??? dulaglutide (TRULICITY) 1.5 mg/0.5 mL injection pen Inject 1.5 mg under  the skin every 7 days.   Past Week   ??? fluticasone propionate (FLONASE) 50 mcg/actuation nasal spray Apply 1 spray to each nostril as directed twice daily. Shake bottle gently before using.   07/14/2018   ??? fluticasone propionate (FLOVENT HFA) 110 mcg/actuation inhaler Inhale 2 puffs by mouth into the lungs twice daily.   07/14/2018   ??? fluticasone-salmeterol (ADVAIR DISKUS) 250-50 mcg inhalation disk Inhale one puff by mouth into the lungs twice daily. 3 Inhaler 3 07/14/2018   ??? gabapentin (NEURONTIN) 400 mg capsule Take two capsules by mouth every 8 hours. 60 capsule 0 07/14/2018   ??? glucosamine(+) 500 mg tab Take one tablet by mouth daily. 30 each 0 Past Month   ??? guaiFENesin LA (MUCINEX) 600 mg tablet Take one tablet by mouth twice daily. (Patient taking differently: Take 600 mg by mouth twice daily as needed.) 180 tablet 0 07/13/2018   ??? insulin glargine (LANTUS SOLOSTAR) 100 unit/mL (3 mL) injection PEN Inject 52 Units under the skin at bedtime daily.   07/13/2018   ??? insulin regular (HUMULIN R) 100 unit/mL soln injection 18 u tid with meals and sliding scale. (Patient taking differently: 24 Units. 24 u tid with meals and sliding scale.) 10 mL 0 07/13/2018   ??? lactobacillus rhamnosus (GG) (CULTURELLE) 10 billion cell cap Take one capsule by mouth daily with breakfast. 90 capsule 0 07/13/2018   ??? levothyroxine (SYNTHROID) 50 mcg tablet Take 50 mcg by mouth daily 30 minutes before breakfast.   07/14/2018   ??? lisinopril (ZESTRIL) 5 mg tablet Take one tablet by mouth daily. 90 tablet 3 07/14/2018   ??? melatonin 10 mg tab Take one tablet by mouth at bedtime as needed. 30 tablet 0 Past Week   ??? metFORMIN (GLUCOPHAGE) 1,000 mg tablet Take one tablet by mouth twice daily with meals. 180 tablet 3 07/13/2018   ??? methocarbamoL (ROBAXIN) 750 mg tablet Take 750 mg by mouth twice daily.   07/13/2018   ??? omega-3s-dha-epa-fish oil (FISH OIL) 120 mg-180 mg- 60 mg-1,200 mg cpDR Take 1 capsule by mouth daily. 30 capsule 0 Past Week   ??? oxyCODONE-acetaminophen(+) (PERCOCET) 7.5-325 mg tablet Take 1-2 tablets by mouth every 6 hours as needed   07/13/2018   ??? sildenafil(+) (VIAGRA) 100 mg tablet Take one tablet by mouth as Needed for Erectile dysfunction. 30-60 min prior to sexual activity 10 tablet 1 >1 Month   ??? tamsulosin (FLOMAX) 0.4 mg capsule Take one capsule by mouth twice ??? insulin regular (HUMULIN R) 100 unit/mL soln injection 18 u tid with meals and sliding scale. (Patient taking differently: 24 Units. 24 u tid with meals and sliding scale.) 10 mL 0   ??? lactobacillus rhamnosus (GG) (CULTURELLE) 10 billion cell cap Take one capsule by mouth daily with breakfast. 90 capsule 0   ??? levothyroxine (SYNTHROID) 50 mcg tablet Take 50 mcg by mouth daily 30 minutes before breakfast.     ??? lisinopril (ZESTRIL) 5 mg tablet Take one tablet by mouth daily. 90 tablet 3   ??? melatonin 10 mg tab Take one tablet by mouth at bedtime as needed. 30 tablet 0   ??? metFORMIN (GLUCOPHAGE) 1,000 mg tablet Take one tablet by mouth twice daily with meals. 180 tablet 3   ??? methocarbamoL (ROBAXIN) 750 mg tablet Take 750 mg by mouth twice daily.     ??? omega-3s-dha-epa-fish oil (FISH OIL) 120 mg-180 mg- 60 mg-1,200 mg cpDR Take 1 capsule by mouth daily. 30 capsule 0   ???  oxyCODONE-acetaminophen(+) (PERCOCET) 7.5-325 mg tablet Take 1-2 tablets by mouth every 6 hours as needed     ??? sildenafil(+) (VIAGRA) 100 mg tablet Take one tablet by mouth as Needed for Erectile dysfunction. 30-60 min prior to sexual activity 10 tablet 1   ??? tamsulosin (FLOMAX) 0.4 mg capsule Take one capsule by mouth twice daily. Do not crush, chew or open capsules. Take 30 minutes following the same meal each day. 90 capsule 3   ??? testosterone cypionate (DEPO-TESTOSTERONE) 200 mg/mL injection Inject 200 mg into the muscle every 14 days.     ??? zolpidem (AMBIEN) 10 mg tablet Take 10 mg by mouth at bedtime as needed for Sleep.         Social History:   Social History     Tobacco Use   ??? Smoking status: Former Smoker     Types: Cigarettes   ??? Smokeless tobacco: Current User     Types: Chew   Substance Use Topics   ??? Alcohol use: Yes     Frequency: 2-4 times a month     Drinks per session: 7 to 9     Binge frequency: Monthly     Comment: social drinker      Family History   Problem Relation Age of Onset   ??? Heart Disease Father Review of Systems  General: negative/normal.  Eyes:  negative/normal.  Ears/Nose/Throat:  negative/normal.  Cardiovascular:  negative/normal.  Respiratory: negative/normal.    Gastrointestinal:  negative/normal.  Genitourinary:  negative/normal.  Musculoskeletal:  negative/normal.  Skin: negative/normal.   Neurologic:  negative/normal.  Psychiatric:  negative/normal.  Endocrine:  negative/normal.  Heme/Lymphatic: negative/normal.  Allergic/Immunologic:  negative/normal.    Physical Exam:  Vital Signs: Last Filed In 24 Hours Vital Signs: 24 Hour Range   BP: 132/114 (05/28 1500)  Temp: 36.6 ???C (97.8 ???F) (05/28 1252)  Pulse: 95 (05/28 1500)  Respirations: 20 PER MINUTE (05/28 1500)  SpO2: 95 % (05/28 1500)  SpO2 Pulse: 92 (05/28 1500)  Height: 170.2 cm (5' 7) (05/28 1524) BP: (86-142)/(50-114)   Temp:  [36.6 ???C (97.8 ???F)-36.7 ???C (98.1 ???F)]   Pulse:  [83-95]   Respirations:  [10 PER MINUTE-21 PER MINUTE]   SpO2:  [95 %-96 %]              Gen: appears stated age, in no acute distress  Head: normocephalic, atraumatic  Eyes: sclera non-icteric, EOMs intact   Mouth: mucous membranes are moderately moist  Neck: no JVD  Lungs: CTA bilaterally without rales or rhonchi  Heart: RRR without murmur or gallop appreciated  Abdomen: soft, nontender, bowel sounds present  Extremities: trace LE edema, pedal pulses are intact  Skin: warm and dry  Neurological: A&Ox3, no focal deficits noted  Psychiatric: calm, pleasant, and cooperative      Lab/Radiology/Other Diagnostic Tests:  Labs:    Hematology:    Lab Results   Component Value Date    HGB 12.1 07/12/2018    HCT 36.4 07/12/2018    PLTCT 255 07/12/2018    WBC 7.3 07/12/2018    NEUT 50 06/29/2017    ANC 2.90 06/29/2017    ALC 2.00 06/29/2017    MONA 12 06/29/2017    AMC 0.70 06/29/2017    ABC 0.10 06/29/2017    MCV 87.2 07/12/2018    MCHC 33.3 07/12/2018    MPV 9.3 07/12/2018    RDW 13.6 07/12/2018    and General Chemistry:    Lab Results   Component Value Date  NA 137 07/12/2018

## 2018-07-12 NOTE — Progress Notes
Patient arrived to COVID clinic for COVID-19 testing 07/12/18 1632. Patient identity confirmed via photo I.D. Nasopharyngeal procedure explained to the patient.   Nasopharyngeal swab completed left  Patient education provided given and instructed patient self isolate until contacted w/ results and further instructions.   Swab collected by Jerrel Ivory, RN.    Date symptoms began/reason for testing: 5/28 preop

## 2018-07-13 ENCOUNTER — Encounter: Admit: 2018-07-13 | Discharge: 2018-07-13 | Payer: Medicaid Other

## 2018-07-13 DIAGNOSIS — R079 Chest pain, unspecified: Principal | ICD-10-CM

## 2018-07-13 DIAGNOSIS — Z1159 Encounter for screening for other viral diseases: ICD-10-CM

## 2018-07-13 LAB — COVID-19 (SARS-COV-2) PCR

## 2018-07-13 NOTE — Progress Notes
Perioperative and Procedural COVID-19 Screening Form    Have you been in contact with someone who was sick? ([facility])  Yes    No / Unsure N   Unable to assess      Does the patient have any of the following symptoms? (Select all that apply)  None of these    Cough N   Muscle Pain N   Shortness of breath N   Unable to assess    Diarrhea N   Rash N   Vomiting N   Abdominal Pain N   Fever Greater than 100F or 37.8 C N   Red eye N   Weakness N   Bruising or bleeding N   Joint Pain N   Severe Headache N   Unable to assess      Travel History:  Has the patient travelled outside [place] and [place] in the past 14 days? <Yes or No>N   If so, where did they travel? <Location>   If so, what dates did they travel? <Dates>     COVID PENDING IN O2    Cardiovascular Labs Scheduling Checklist   1. Date of procedure:05-28  2. Arrival time:0920  3. Patient instructed NPO after MN; clear liquids until: 820  4. Instructed to check-in at the heart hospital registration desk and bring photo id, insurance cards and a current list of home medications.  Pack an overnight bag in the event you are admitted overnight: Y  5. If you have a history of sleep apnea and use a C-Pap or Bi-Pap machine, please bring it with you to the hospital:N  6. Have a driver available upon discharge as you may not be cleared to drive for 48 hours or more post procedure. (exception is RHC/biopsy patient with internal jugular approach not receiving sedation): Y  7. If the patient's language preference is other than English, please indicated native language and need for interpreter:  N  8. Patient instructed to drink 64 oz water the day before their procedure if applicable and not contraindicated: Y  9. Patient instructed no caffeine for 24 hours before procedure: Y  10. Pre-procedure medications reviewed:   Hold the following Medications: Continue to take the following:                     11. Contrast allergy with need for contrast allergy prophylaxis: N

## 2018-07-14 ENCOUNTER — Encounter: Admit: 2018-07-14 | Discharge: 2018-07-14 | Payer: Medicaid Other

## 2018-07-14 ENCOUNTER — Encounter: Admit: 2018-07-14 | Discharge: 2018-07-14 | Payer: MEDICAID

## 2018-07-14 ENCOUNTER — Encounter: Admit: 2018-07-14 | Discharge: 2018-07-15 | Payer: Medicaid Other

## 2018-07-14 DIAGNOSIS — R7989 Other specified abnormal findings of blood chemistry: ICD-10-CM

## 2018-07-14 DIAGNOSIS — G4730 Sleep apnea, unspecified: Principal | ICD-10-CM

## 2018-07-14 DIAGNOSIS — R0789 Other chest pain: ICD-10-CM

## 2018-07-14 DIAGNOSIS — R9439 Abnormal result of other cardiovascular function study: Secondary | ICD-10-CM

## 2018-07-14 DIAGNOSIS — J449 Chronic obstructive pulmonary disease, unspecified: ICD-10-CM

## 2018-07-14 DIAGNOSIS — J45909 Unspecified asthma, uncomplicated: ICD-10-CM

## 2018-07-14 DIAGNOSIS — E119 Type 2 diabetes mellitus without complications: ICD-10-CM

## 2018-07-14 DIAGNOSIS — E669 Obesity, unspecified: ICD-10-CM

## 2018-07-14 DIAGNOSIS — I10 Essential (primary) hypertension: ICD-10-CM

## 2018-07-14 LAB — POC GLUCOSE
Lab: 174 mg/dL — ABNORMAL HIGH (ref 70–100)
Lab: 130 mg/dL — ABNORMAL HIGH (ref 70–100)
Lab: 242 mg/dL — ABNORMAL HIGH (ref 70–100)

## 2018-07-14 MED ORDER — ACETAMINOPHEN 325 MG PO TAB
650 mg | ORAL | 0 refills | Status: DC | PRN
Start: 2018-07-14 — End: 2018-07-15

## 2018-07-14 MED ORDER — TEMAZEPAM 15 MG PO CAP
15 mg | Freq: Every evening | ORAL | 0 refills | Status: DC | PRN
Start: 2018-07-14 — End: 2018-07-15

## 2018-07-14 MED ORDER — NITROGLYCERIN 0.4 MG SL SUBL
.4 mg | SUBLINGUAL | 0 refills | Status: DC | PRN
Start: 2018-07-14 — End: 2018-07-15

## 2018-07-14 MED ORDER — DIPHENHYDRAMINE HCL 25 MG PO CAP
25 mg | ORAL | 0 refills | Status: DC | PRN
Start: 2018-07-14 — End: 2018-07-15

## 2018-07-14 MED ORDER — METFORMIN 1,000 MG PO TAB
1000 mg | ORAL_TABLET | Freq: Two times a day (BID) | ORAL | 3 refills | Status: DC
Start: 2018-07-14 — End: 2019-01-09

## 2018-07-14 MED ORDER — DOCUSATE SODIUM 100 MG PO CAP
100 mg | Freq: Every day | ORAL | 0 refills | Status: DC | PRN
Start: 2018-07-14 — End: 2018-07-15

## 2018-07-14 MED ORDER — ONDANSETRON HCL (PF) 4 MG/2 ML IJ SOLN
4 mg | INTRAVENOUS | 0 refills | Status: DC | PRN
Start: 2018-07-14 — End: 2018-07-15

## 2018-07-14 MED ORDER — SODIUM CHLORIDE 0.9 % IV SOLP
1000 mL | INTRAVENOUS | 0 refills | Status: DC
Start: 2018-07-14 — End: 2018-07-15
  Administered 2018-07-14: 16:00:00 1000 mL via INTRAVENOUS

## 2018-07-14 MED ORDER — ATORVASTATIN 10 MG PO TAB
20 mg | Freq: Every day | ORAL | 0 refills | Status: DC
Start: 2018-07-14 — End: 2019-01-09

## 2018-07-14 MED ORDER — DIPHENHYDRAMINE HCL 50 MG/ML IJ SOLN
25 mg | INTRAVENOUS | 0 refills | Status: DC | PRN
Start: 2018-07-14 — End: 2018-07-15

## 2018-07-14 MED ORDER — PERFLUTREN LIPID MICROSPHERES 1.1 MG/ML IV SUSP
1-20 mL | Freq: Once | INTRAVENOUS | 0 refills | Status: CP | PRN
Start: 2018-07-14 — End: ?
  Administered 2018-07-14: 21:00:00 3 mL via INTRAVENOUS

## 2018-07-14 MED ORDER — ALUMINUM-MAGNESIUM HYDROXIDE 200-200 MG/5 ML PO SUSP
30 mL | ORAL | 0 refills | Status: DC | PRN
Start: 2018-07-14 — End: 2018-07-15

## 2018-07-14 NOTE — Progress Notes
Patient discharged to home with all belongings.  Discharge instructions, med reconciliation and home wound care instructions given and explained to patient and family both verbally and written.  Accompanied by sefl.  No complaints of pain or discomfort.   Right radial site  remains clean, dry, and intact with no evidence of a Hematoma after ambulation.  Patient escorted to lobby via [employer]otech.  Patient to follow up with [facility] [place] [facility]) or on-call physician with any additional questions or concerns.  All contact numbers provided.  Patient and family acceptant of DC instuctions and report understanding to all information.

## 2018-07-14 NOTE — Discharge Instructions - Pharmacy
distribution. ???EF was 52%. ???He was therefore referred for cardiac catheterization to further define his coronary anatomy. He also c/o exertional???shortness of breath and fatigue.     The patient presented for cardiac catheterization which revealed no significant coronary disease.  An echocardiogram revealed normal LV function, no significant valve disease. He will continue GDMT.        Upon discharge the patient and family were given post procedure instructions/restrictions as well as written instructions (see Discharge instructions).    Discharge Plan:     Follow up: with Roberts Gaudy, PA, on 08/11/2018    He has some tenderness with palpation of his left chest which reproduces his chest pain. It was recommended he discuss this with his PCP as he should probably not be on on high dose NSAIDs due to his history of renal insufficiency.      Disease Prevention:  Lipid management:  His LDL was 97 last month and he just started back on Atorvastatin 20mg  daily.     Hypertension: BP controlled on current regimen.  Coreg was just increased to 25 mg twice daily a couple weeks ago.    Diabetes: yes, goal HgbA1C <7.0    Tobacco: NA-quit smoking    Obesity: BMI is 58.26. Heart healthy diet, exercise and weight loss encouraged    Condition at Discharge: Stable   BP (!) 153/88  - Pulse 84  - Temp 36.9 ???C (98.4 ???F)  - Ht 1.702 m (5' 7)  - Wt (!) 169 kg (372 lb 9.2 oz)  - SpO2 97%  - BMI 58.35 kg/m???   Right wrist without swelling/hematoma; Right radial and ulnar pulses intact with good capillary refill.     Discharge Diagnoses:    Hospital Problems        Active Problems    * (Principal) Abnormal stress test    Type 2 diabetes mellitus (HCC)    Essential hypertension    Morbid obesity with BMI of 50.0-59.9, adult (HCC)    Chest discomfort        Significant Diagnostic Studies and Procedures:   Cardiac Catheterization 07/14/2018  IMPRESSION:  Normal coronary arteries with no evidence of any significant atherosclerosis.  ??? HEMODYNAMICS:    1. Aortic pressure 104/67 with a mean of 80 mmHg.    2. Left ventricular systolic pressure 128 mmHg.  Left ventricular end-diastolic pressure is 18 mmHg.  3. The left ventricular end-diastolic pressure as noted above is mildly elevated.  4. No evidence of any significant gradient across the aortic valve.    Echocardiogram 07/14/2018  1. Normal LV size, wall motion with EF 55%.   2. Normal LV diastolic function  3. Normal RV size and function.   4. Suboptimal visualization of the valve, however no significant valvular abnormality on color Doppler.  5. Normal CVP & PA systolic pressure 33 mmHg.    ???    Patient instructions/medications:       Cardiac Diet    Limiting unhealthy fats and cholesterol is the most important step you can take in reducing your risk for cardiovascular disease.  Unhealthy fats include saturated and trans fats.  Monitor your sodium and cholesterol intake.  Restrict your sodium to 2g (grams) or 2000mg  (milligrams) daily, and your cholesterol to 200mg  daily.    If you have questions regarding your diet at home, you may contact a dietitian at [phone].       Diabetic Diet    You should eat between 1600 and 2000 calories  per day.  This is equal to 60g (grams) of carbohydrates per meal, and 30g of carbohydrates for a bedtime snack.    If you have questions about your diet after you go home, you can call a dietitian at [phone].     Discharge Signs/Symptoms    Please contact your doctor if you have any of the following symptoms: Chest pain, shortness of breath, lightheadedness, dizziness, near fainting, palpitations, or bleeding.     Questions About Your Stay    For questions or concerns regarding your hospital stay:    - DURING BUSINESS HOURS (8:00 AM - 4:30 PM):  Call [phone] and asked to be transferred to your discharge attending physician.    - AFTER BUSINESS HOURS (4:30 PM - 8:00 AM, on weekends, or holidays): fluticasone propionate (FLOVENT HFA) 110 mcg/actuation inhaler Inhale 2 puffs by mouth into the lungs twice daily.    PRESCRIPTION TYPE:  Historical Med      fluticasone-salmeterol (ADVAIR DISKUS) 250-50 mcg inhalation disk Inhale one puff by mouth into the lungs twice daily.  Qty: 3 Inhaler, Refills: 3    PRESCRIPTION TYPE:  Normal      gabapentin (NEURONTIN) 400 mg capsule Take two capsules by mouth every 8 hours.  Qty: 60 capsule, Refills: 0    PRESCRIPTION TYPE:  Normal      glucosamine(+) 500 mg tab Take one tablet by mouth daily.  Qty: 30 each, Refills: 0    PRESCRIPTION TYPE:  Normal      guaiFENesin LA (MUCINEX) 600 mg tablet Take one tablet by mouth twice daily.  Qty: 180 tablet, Refills: 0    PRESCRIPTION TYPE:  Normal      insulin glargine (LANTUS SOLOSTAR) 100 unit/mL (3 mL) injection PEN Inject 52 Units under the skin at bedtime daily.    PRESCRIPTION TYPE:  Historical Med  Comments: The pharmacist may select Lantus Solorstar or Elon Jester based on what is cheaper for the patient.      insulin regular (HUMULIN R) 100 unit/mL soln injection 18 u tid with meals and sliding scale.  Qty: 10 mL, Refills: 0    PRESCRIPTION TYPE:  Normal      lactobacillus rhamnosus (GG) (CULTURELLE) 10 billion cell cap Take one capsule by mouth daily with breakfast.  Qty: 90 capsule, Refills: 0    PRESCRIPTION TYPE:  Normal      levothyroxine (SYNTHROID) 50 mcg tablet Take 50 mcg by mouth daily 30 minutes before breakfast.    PRESCRIPTION TYPE:  Historical Med      lisinopril (ZESTRIL) 5 mg tablet Take one tablet by mouth daily.  Qty: 90 tablet, Refills: 3    PRESCRIPTION TYPE:  Normal      melatonin 10 mg tab Take one tablet by mouth at bedtime as needed.  Qty: 30 tablet, Refills: 0    PRESCRIPTION TYPE:  Normal      methocarbamoL (ROBAXIN) 750 mg tablet Take 750 mg by mouth twice daily.    PRESCRIPTION TYPE:  Historical Med      omega-3s-dha-epa-fish oil (FISH OIL) 120 mg-180 mg- 60 mg-1,200 mg cpDR Take 1 capsule by mouth daily.  Qty: 30 capsule, Refills: 0    PRESCRIPTION TYPE:  Normal      oxyCODONE-acetaminophen(+) (PERCOCET) 7.5-325 mg tablet Take 1-2 tablets by mouth every 6 hours as needed    PRESCRIPTION TYPE:  Historical Med      sildenafil(+) (VIAGRA) 100 mg tablet Take one tablet by mouth as Needed for Erectile dysfunction.  30-60 min prior to sexual activity  Qty: 10 tablet, Refills: 1    PRESCRIPTION TYPE:  Print      tamsulosin (FLOMAX) 0.4 mg capsule Take one capsule by mouth twice daily. Do not crush, chew or open capsules. Take 30 minutes following the same meal each day.  Qty: 90 capsule, Refills: 3    PRESCRIPTION TYPE:  Normal      testosterone cypionate (DEPO-TESTOSTERONE) 200 mg/mL injection Inject 200 mg into the muscle every 14 days.    PRESCRIPTION TYPE:  Historical Med      zolpidem (AMBIEN) 10 mg tablet Take 10 mg by mouth at bedtime as needed for Sleep.    PRESCRIPTION TYPE:  Historical Med           Signed:  Efrain Sella, PA-C  07/14/2018      cc:  Primary Care Physician:  Steva Ready   Verified  Referring physicians:  Myriam Jacobson, MD   Additional provider(s): Harrie Foreman, MD

## 2018-07-14 NOTE — Progress Notes
Patient arrived on unit via ambulation accompanied by family. Patient transferred to the bed without assistance. Frailty score equals 3  Assessment completed, refer to flowsheet for details. Orders released, reviewed, and implemented as appropriate. Oriented to surroundings, call light within reach. Plan of care reviewed.  Will continue to monitor and assess.

## 2018-08-10 ENCOUNTER — Encounter: Admit: 2018-08-10 | Discharge: 2018-08-10

## 2018-08-10 NOTE — Telephone Encounter
Pt is scheduled for a telehealth appointment tomorrow with Jody. Spoke with pt, confirmed he would like to keep telehealth and not face to face. Asked pt to have all his current medications available, morning of weights and bp pressure readings for the telehealth; pt verbalized understanding. Informed pt we will call him at approx. 1415.

## 2018-08-11 ENCOUNTER — Encounter: Admit: 2018-08-11 | Discharge: 2018-08-11

## 2018-08-11 ENCOUNTER — Ambulatory Visit: Admit: 2018-08-11 | Discharge: 2018-08-12

## 2018-08-11 DIAGNOSIS — Z794 Long term (current) use of insulin: Secondary | ICD-10-CM

## 2018-08-11 DIAGNOSIS — I10 Essential (primary) hypertension: Secondary | ICD-10-CM

## 2018-08-11 DIAGNOSIS — Z6841 Body Mass Index (BMI) 40.0 and over, adult: Secondary | ICD-10-CM

## 2018-08-11 DIAGNOSIS — E1140 Type 2 diabetes mellitus with diabetic neuropathy, unspecified: Secondary | ICD-10-CM

## 2018-08-11 NOTE — Progress Notes
Date of Service: 08/11/2018    JEREMAIH TILLEY is a 48 y.o. male.       HPI     I wanted to send you an update on your patient, Zacheria Tooke, who I saw hospital for followup in the Cardiovascular Medicine clinic at [facility].     Kadon???is a 48 y.o.???male???who is followed by Dr Neale Burly with hypertension, morbid obesity, DMT2 nsulin-dependent, dyslipidemia and family history of premature coronary artery disease.     He was seen by Dr Neale Burly during a telehealth visit on 06/27/2018 endorsing chest pains over the past 6 weeks.  He had a prior myocardial perfusion stress test in May 2019 which was unremarkable.  Repeat ischemic evaluation was obtained on 07/07/2018 showing a small size reversible perfusion defect in the inferior and inferior lateral segments from the base to the mid ventricular cavity consistent with ischemia in the right coronary distribution, his EF was 42%.     Due to the abnormal myocardial perfusion stress test he was referred for cardiac catheterization on 07/14/2018 showing normal coronary angiography without significant atherosclerosis and mildly elevated LVEDP of 18 mmHg.   Repeat echo Doppler on May 28 showed normal LV function with an EF of 55%, normal diastolic function, normal RV size and function, normal CVP and pulmonary pressures    Rayburn is doing well from a cardiac perspective.  Unfortunately due to his morbid obesity with the BMI of 58, and 372 pounds it is difficult for him to participate in activities.  He denies recurrent chest pains but has persistent dyspnea on exertion.  He does not have a blood pressure monitor at home to gauge his antihypertensive agents.      He denies lightheadedness, presyncope or syncopal episodes.  He denies orthopnea, PND or peripheral edema.               Vitals:    08/11/18 1424   Height: 1.702 m (5' 7)   PainSc: Zero     Body mass index is 58.26 kg/m???.     Past Medical History  Patient Active Problem List Diagnosis Date Noted   ??? Abnormal stress test 07/14/2018     07/07/18 Stress test:  EF 42 %. This study is technically difficult given patient's extreme body habitus.  However it does appear abnormal. There is a small sized mild intensity reversible perfusion abnormality seen in the inferior and inferolateral segments from the base to the mid ventricular cavity.  This is consistent with ischemia in the right coronary artery distribution.  The global left ventricular systolic function is mild to moderately depressed with an LVEF of 42%. There are no high risk prognostic indicators present.  The pharmacologic ECG portion of the study is negative for ischemia.Comparison is made with a prior thallium myocardial perfusion imaging study completed 06/19/2017.  Ejection fraction was 66 %.  The study was normal and did not reveal any perfusion abnormalities.  The small inferior and inferolateral reversible perfusion defect is new and there is been a decrease in the calculated LVEF.  07/14/18 ECHO:  EF 55%. Normal LV diastolic functionNormal RV size and function. Suboptimal visualization of the valve, however no significant valvular abnormality on color Doppler.Normal CVP & PA systolic pressure 33 mmHg.  Compared to prior TEE study dated 06/02/2017, LVEF is similar, no significant valvular abnormality was seen in the previous study and current study  07/14/18 LHC via right radial: IMPRESSION:  Normal coronary arteries with no evidence of  any significant atherosclerosis     ??? Chest discomfort 06/28/2018   ??? ATN (acute tubular necrosis) (HCC) 06/29/2017   ??? Acute diffuse otitis externa of right ear 06/29/2017   ??? Sleep apnea 06/29/2017   ??? Generalized anxiety disorder 06/29/2017   ??? 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 Morbid obesity with BMI of 50.0-59.9, adult (HCC) 06/17/2017   ??? Bacteremia 06/17/2017     06/02/2017- TEE: EF of 60%. Normal right ventricular size and function. No significant valvular disease identified.No pericardial effusion. No evidence of endocarditis. No evidence of thrombus in the left atrium or left atrial appendage. Small patent foramen ovale (No atrial septal aneurysm)       ??? Acute kidney injury (HCC) 06/17/2017   ??? Dyspnea 06/17/2017   ??? S/P TURP 06/17/2017   ??? COPD (chronic obstructive pulmonary disease) (HCC) 06/17/2017   ??? Essential hypertension 05/28/2017     06/19/2017- [facility] MPI: EF 66%. This study is normal and represents low probability for significant inducible myocardial ischemia. There are no perfusion abnormalities identified on this study. All myocardial segments appear viable. Regional and global left ventricular function are normal. High risk scintigraphic features are absent.      ??? Chronic low back pain 05/28/2017   ??? Major depressive disorder 05/28/2017   ??? Hyperglycemia without ketosis 05/28/2017   ??? Type 2 diabetes mellitus (HCC) 05/27/2017         Review of Systems   Constitution: Negative.   HENT: Negative.    Eyes: Negative.    Cardiovascular: Negative.    Respiratory: Negative.    Endocrine: Negative.    Skin: Negative.    Musculoskeletal: Negative.    Gastrointestinal: Negative.    Genitourinary: Negative.    Neurological: Negative.    Psychiatric/Behavioral: Negative.    Allergic/Immunologic: Negative.        Physical Exam   General: Alert and oriented, speech appropriate.  Skin: Color pink  Respirations: Unlabored during conversation.  ROM: good range of motion to upper extremities    Cardiovascular Studies  No ECG       Problems Addressed Today  Encounter Diagnoses   Name Primary?   ??? Type 2 diabetes mellitus with diabetic neuropathy, with long-term current use of insulin (HCC) Yes   ??? Essential hypertension    ??? Morbid obesity with BMI of 50.0-59.9, adult (HCC)        Assessment and Plan     1.  Chest pain with normal coronary angiography per cardiac catheterization on 07/14/2018  2.  Essential hypertension  3.  Morbid obesity 4.  Dyslipidemia  5.  Diabetic mellitus type II    Overall Saahil is doing well from a cardiac perspective.  He remains on carvedilol 25 mg twice a day, lisinopril 5 mg for blood pressure management.  I have encouraged him to obtain a blood pressure monitor so he can continue outpatient management for goal systolic less than 130 and diastolics less than 85.    We discussed bariatric surgery in the near future to assist with his weight loss management and should be cleared from a CV standpoint given his normal coronary angiography and normal echo Doppler.  Given his body habitus I suspect that he has obstructive sleep apnea and would request that he obtain a sleep study.  He endorses this may be cost prohibitive for the time since he is out of a job due to COVID-19.    His lipid panel in April  showed LDL of 97 on Lipitor 20 mg daily.  He will need a repeat lipid panel in 3 months for goal LDL of less than 70 for aggressive risk management.    He is to follow-up with his PCP in 1 month for blood pressure check, Dr. Neale Burly in 6 months for continued cardiac management    Thank you for allowing me to participate in Mr Mancebo's care.  Please feel free to contact me if I can be of further assistance.    Roberts Gaudy PA-c    Visit Start Time 2:35 pm Visit End Time 2:59 pm       DISEASE PREVENTION:   Antiplatelet/Anticoagulation: ASA 81 mg EC daily    Lipids/Statin treatment: Crestor (rosuvastatin)  Lab Results   Component Value Date/Time    CHOL 265 (H) 06/14/2018    TRIG 499 (H) 06/14/2018    HDL 49 06/14/2018    LDL 97 06/14/2018    VLDL 811 (H) 06/14/2018    NONHDLCHOL 115 05/28/2017 04:00 PM     Goal LDL < 70, Triglycerides < 914 mg/dl.     HTN: unknow readings.. Goal Systolic < 130, diastolic < 90.     Diabetes:  Yes   Lab Results   Component Value Date/Time    HGBA1C 8.8 (H) 06/25/2017 06:00 AM    HGBA1C 11.3 (H) 05/30/2017 04:57 AM    A1C 8.2 12/10/2017 10:19 AM    Follows with PCP for goal A1c < 7.0 Tobacco: denies.    Obesity/Nutrition/Physical Activity: Body mass index is 58.26 kg/m???. Discussed exercise recommendation of 30 min most days of the week and diet management with emphasis on vegetables, fruit and lean meat.               Current Medications (including today's revisions)  ??? aspirin EC 81 mg tablet Take 81 mg by mouth daily. Take with food.   ??? atorvastatin (LIPITOR) 10 mg tablet Take two tablets by mouth daily.   ??? carvediloL (COREG) 25 mg tablet Take one tablet by mouth twice daily. Take with food.   ??? citalopram (CELEXA) 40 mg tablet Take 40 mg by mouth daily.   ??? dulaglutide (TRULICITY) 1.5 mg/0.5 mL injection pen Inject 1.5 mg under the skin every 7 days.   ??? fluticasone propionate (FLONASE) 50 mcg/actuation nasal spray Apply 1 spray to each nostril as directed twice daily. Shake bottle gently before using.   ??? fluticasone propionate (FLOVENT HFA) 110 mcg/actuation inhaler Inhale 2 puffs by mouth into the lungs twice daily.   ??? fluticasone-salmeterol (ADVAIR DISKUS) 250-50 mcg inhalation disk Inhale one puff by mouth into the lungs twice daily.   ??? gabapentin (NEURONTIN) 400 mg capsule Take two capsules by mouth every 8 hours.   ??? glucosamine(+) 500 mg tab Take one tablet by mouth daily.   ??? guaiFENesin LA (MUCINEX) 600 mg tablet Take one tablet by mouth twice daily. (Patient taking differently: Take 600 mg by mouth twice daily as needed.)   ??? insulin glargine (LANTUS SOLOSTAR) 100 unit/mL (3 mL) injection PEN Inject 52 Units under the skin at bedtime daily.   ??? insulin regular (HUMULIN R) 100 unit/mL soln injection 18 u tid with meals and sliding scale. (Patient taking differently: 24 Units. 24 u tid with meals and sliding scale.)   ??? lactobacillus rhamnosus (GG) (CULTURELLE) 10 billion cell cap Take one capsule by mouth daily with breakfast.   ??? levothyroxine (SYNTHROID) 50 mcg tablet Take 50 mcg by mouth daily 30  minutes before breakfast. ??? lisinopril (ZESTRIL) 5 mg tablet Take one tablet by mouth daily.   ??? melatonin 10 mg tab Take one tablet by mouth at bedtime as needed.   ??? metFORMIN (GLUCOPHAGE) 1,000 mg tablet Take one tablet by mouth twice daily with meals. Restart 07/17/18 with morning dose.   ??? methocarbamoL (ROBAXIN) 750 mg tablet Take 750 mg by mouth twice daily.   ??? omega-3s-dha-epa-fish oil (FISH OIL) 120 mg-180 mg- 60 mg-1,200 mg cpDR Take 1 capsule by mouth daily.   ??? oxyCODONE-acetaminophen(+) (PERCOCET) 7.5-325 mg tablet Take 1-2 tablets by mouth every 6 hours as needed   ??? sildenafil(+) (VIAGRA) 100 mg tablet Take one tablet by mouth as Needed for Erectile dysfunction. 30-60 min prior to sexual activity   ??? tamsulosin (FLOMAX) 0.4 mg capsule Take one capsule by mouth twice daily. Do not crush, chew or open capsules. Take 30 minutes following the same meal each day.   ??? testosterone cypionate (DEPO-TESTOSTERONE) 200 mg/mL injection Inject 200 mg into the muscle every 14 days.

## 2018-08-11 NOTE — Telephone Encounter
After visit summary reviewed with pt during the telehealth visit and a copy of the After Visit Summary has been mailed to the pt, address confirmed. Direct nursing line number provided.

## 2018-08-11 NOTE — Patient Instructions
Call [phone] in TWO months to make a follow up appointment to see Dr. Domingo Cocking in [place] months, December of 2020.    Follow up with your PCP in one month for a blood pressure check.    Start taking your blood pressure and documenting the readings. Call if your blood pressure is consistently over 130/85. SBP (top number) 130 and DBP (bottom number) 85.      Please contact the cardiology [place] Team if you have any questions or concerns. Call us at [phone] or send an email through the [facility] system if you have non-urgent concerns. We will get back to you as soon as possible. For urgent or after hours needs, please call [phone].     NOTE: MyChart messages and phone calls received on weekends, on holidays, and after 4 pm on weekdays will NOT be seen until the following business day. If you have an urgent matter during these times, please call [phone] to reach the on-call team.     If you need prescription refills, please contact your pharmacy.     I would like you to monitor your blood pressures at least 2-3 times a week. You can pick up a monitor like the Omron 3 monitor at your local pharmacy.  Please check your pressures at least 2 hours after you have taken your blood pressure medicine and log the numbers in your log book.  Please bring your blood pressure logs with you to your next appointment.

## 2018-09-02 ENCOUNTER — Encounter: Admit: 2018-09-02 | Discharge: 2018-09-02

## 2018-09-05 ENCOUNTER — Encounter: Admit: 2018-09-05 | Discharge: 2018-09-05

## 2018-09-06 ENCOUNTER — Encounter: Admit: 2018-09-06 | Discharge: 2018-09-06
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
## 2018-09-06 NOTE — Telephone Encounter
PATIENT MUST CALL B[facility] TO [facility]   [phone]  Selena Lesser PT AT [phone] LVM     Date verified: 09/06/18  Insurance Plan:[employer] MEDICAID  ID #:[id number]  Group #:[id number]  Verified by (phone # if applicable):[phone] [facility]  Reference #:[id number]  In Network for Facility:YES  Effective Date:06/17/18  Deductible  NO Amount remaining:0.00  Coinsurance 100% MEDICAID ALLOWABLE   OOP Max   NO Amount remaining: $0.00  Copay (PCP): $0.00  Copay (Specialist): $ 0.00  Auth Req Y/NAnnitta Needs  [id number]  Name of drug/procedure:LAPOSCOPIC [place] source for Auth/Referral:[employer]HC MEDICAID   Phone (if applies):[phone]  Auth #: PENDING   Authorization Date to/from: PENDING

## 2018-09-07 ENCOUNTER — Encounter: Admit: 2018-09-07 | Discharge: 2018-09-07

## 2018-09-07 NOTE — Progress Notes
Obtained patient's verbal consent to treat them and their agreement to [facility] financial policy and NPP via this telehealth visit during the [facility] Emergency      Subjective:       History of Present Illness  Thomas Francis is a 48 y.o. male.type 2 diabetes (uncontrolled),Hypothyroid   hypertension, morbid obesity, COPD, OSA. chronic pain, and severe depression who was seen today as a follow up for type 2 diabetes.  He was last seen on 06/03/18  Interval changes:   Getting prep for bariatric surgery.  He is getting sleep study done next week.  He was admitted for cardiac cath on 07/14/2018 which revealed no significant coronary disease.  An echocardiogram revealed normal LV function, no significant valve disease  He continues to note fatigue, chronic severe back pain, not getting adequate treatment due to cost insurance, extreme fatigue, depressed mood. He was started on testosterone therapy by Dr. Suan Francis, 200 mg q 2 weeks.  This has helped with his mood but not erectile dysfunction.    Type 2  Diabetes Mellitus with complications, diagnosed since 2010  Most recent  reported HbA1C around 10.2 early July 2020, previously HgbA1C 8.2 on 12/10/17,  PTA regimen:-   Lantus  50-->60 units daily and humalog insulin  24 units TID, misses insulin around 30% of the time.  Trulicity weekly 1.5 mg, and metformin 1000 mg twice daily   Previous therapy; NPH and regular insulin  Frequency of blood sugar check 1-2 times daily, no meters, reported fasting blood sugar in the 200 range.  Hypoglycemic episodes on this regimen: none  Diabetic-complications assessment:               Retinopathy: due for exam                Peripheral neuropathy: yes               Autonomic neuropathy: no               Nephropathy: unknown, no recent MACR               Macrovascular complications: no               Hx of DKA - no  On ACEi/ARB?:  Lisinopril 5 mg daily  On Statin?: lipitor 10 mg daily  Exercise - limited due to back pain. Weight changes weight gain due to poor diet. He gained 20 lbs from last year, weight stable from last visit and interested in getting bariatric surgery.  Hypothyroid:  Lt 4 50 mcg daily, most recent TSH of 1.02 06/14/18     SH: on disability with nursing background, no smoking or excessive alcohol  Family history: of multiple family members with type 2 diabetes and morbid obesity.  Father had bypass surgery at age 91  PMH: COPD, hypertension, OSA, chronic lower back pain, hypogonadism  Review of Systems  Constitutional:   Fatigue: yes  Pain: yes  Skin:   Heat or cold intolerance : no  Dry skin : no  Flushing episodes : no  Hair loss : no  Excessive body or facial hair: no  Head   Headaches: no  Double or blurry vision : no  Difficulty swallowing or choking : no  Feeling of food stuck: no  Neck enlargement or lumps : no  Head or chest radiation exposure : no  Decreased hearing : no  Chest/Heart   Nipple discharge : no  Chest pain or pressure:  no  Heartburn: no  Shortness of breath: no  Heart racing or pounding :yes  Digestion   Abdominal or belly pain : no  Constipation : no  Diarrhea : no  Nausea and vomiting: no  Reflux : no  Lactose intollerance : no  Reproduction   Difficulty with erections: yes  Decreased interest in sex:yes  Urinary   Kidney stones: no   Night time urination : yes  Skeleton   Muscle cramping: yes  Glands   Weight loss : no  Weight gain : yes  Nerves   Nervous or anxious : yes  Seizures or falls : no  Dizziness : no  numbenss or tingling :yes  Mental Health   Do you feel sad? :yes  Sleep problems : yes        Objective:         ??? aspirin EC 81 mg tablet Take 81 mg by mouth daily. Take with food.   ??? atorvastatin (LIPITOR) 10 mg tablet Take two tablets by mouth daily.   ??? carvediloL (COREG) 25 mg tablet Take one tablet by mouth twice daily. Take with food.   ??? citalopram (CELEXA) 40 mg tablet Take 40 mg by mouth daily.   ??? dulaglutide (TRULICITY) 1.5 mg/0.5 mL injection pen Inject 1.5 mg under the skin every 7 days.   ??? fluticasone propionate (FLONASE) 50 mcg/actuation nasal spray Apply 1 spray to each nostril as directed twice daily. Shake bottle gently before using.   ??? fluticasone propionate (FLOVENT HFA) 110 mcg/actuation inhaler Inhale 2 puffs by mouth into the lungs twice daily.   ??? fluticasone-salmeterol (ADVAIR DISKUS) 250-50 mcg inhalation disk Inhale one puff by mouth into the lungs twice daily.   ??? gabapentin (NEURONTIN) 400 mg capsule Take two capsules by mouth every 8 hours.   ??? glucosamine(+) 500 mg tab Take one tablet by mouth daily.   ??? guaiFENesin LA (MUCINEX) 600 mg tablet Take one tablet by mouth twice daily. (Patient taking differently: Take 600 mg by mouth twice daily as needed.)   ??? insulin glargine (LANTUS SOLOSTAR) 100 unit/mL (3 mL) injection PEN Inject 52 Units under the skin at bedtime daily.   ??? insulin regular (HUMULIN R) 100 unit/mL soln injection 18 u tid with meals and sliding scale. (Patient taking differently: 24 Units. 24 u tid with meals and sliding scale.)   ??? lactobacillus rhamnosus (GG) (CULTURELLE) 10 billion cell cap Take one capsule by mouth daily with breakfast.   ??? levothyroxine (SYNTHROID) 50 mcg tablet Take 50 mcg by mouth daily 30 minutes before breakfast.   ??? lisinopril (ZESTRIL) 5 mg tablet Take one tablet by mouth daily.   ??? melatonin 10 mg tab Take one tablet by mouth at bedtime as needed.   ??? metFORMIN (GLUCOPHAGE) 1,000 mg tablet Take one tablet by mouth twice daily with meals. Restart 07/17/18 with morning dose.   ??? methocarbamoL (ROBAXIN) 750 mg tablet Take 750 mg by mouth twice daily.   ??? omega-3s-dha-epa-fish oil (FISH OIL) 120 mg-180 mg- 60 mg-1,200 mg cpDR Take 1 capsule by mouth daily.   ??? oxyCODONE-acetaminophen(+) (PERCOCET) 7.5-325 mg tablet Take 1-2 tablets by mouth every 6 hours as needed   ??? sildenafil(+) (VIAGRA) 100 mg tablet Take one tablet by mouth as Needed for Erectile dysfunction. 30-60 min prior to sexual activity ??? tamsulosin (FLOMAX) 0.4 mg capsule Take one capsule by mouth twice daily. Do not crush, chew or open capsules. Take 30 minutes following the same meal each day.   ???  testosterone cypionate (DEPO-TESTOSTERONE) 200 mg/mL injection Inject 200 mg into the muscle every 14 days.     Vitals:    09/09/18 1320   Weight: (!) 167.8 kg (370 lb)   Height: 170.2 cm (67.01)   PainSc: Six     Body mass index is 57.94 kg/m???.     Physical Exam  General Appearance:morbidly obesed, no distress   Respiratory Effort: breathing comfortably, no respiratory distress   Gait & Station: walks without assistance Orientation: oriented to time, place and person   Affect & Mood: appropriate and sustained affect   Language and Memory: patient responsive and seems to comprehend information   Neurologic Exam: neurological assessment grossly intact   Other: moves all extremities           Results for Thomas Francis, Thomas Francis (MRN [id number]) as of 09/09/2018 13:38   Ref. Range 06/14/2018 00:00 07/12/2018 17:14   Hemoglobin Latest Ref Range: 13.5 - 16.5 GM/DL 45.4 (L) 09.8 (L)   Hematocrit Latest Ref Range: 40 - 50 % 37.2 (L) 36.4 (L)   Platelet Count Latest Ref Range: 150 - 400 K/UL 208 255   White Blood Cells Latest Ref Range: 4.5 - 11.0 K/UL 6.8 7.3   RBC Latest Ref Range: 4.4 - 5.5 M/UL 4.22 (L) 4.18 (L)   MCV Latest Ref Range: 80 - 100 FL 88 87.2   MCH Latest Ref Range: 26 - 34 PG 29.3 29.0   MCHC Latest Ref Range: 32.0 - 36.0 G/DL 11.9 14.7   MPV Latest Ref Range: 7 - 11 FL  9.3   RDW Latest Ref Range: 11 - 15 % 12.6 13.6   Sodium Latest Ref Range: 137 - 147 MMOL/L 135 (L) 137   Potassium Latest Ref Range: 3.5 - 5.1 MMOL/L 4.0 4.4   Chloride Latest Ref Range: 98 - 110 MMOL/L 100 103   CO2 Latest Ref Range: 21 - 30 MMOL/L 24 24   Anion Gap Latest Ref Range: 3 - 12  15 (H) 10   Blood Urea Nitrogen Latest Ref Range: 7 - 25 MG/DL 10 15   Creatinine Latest Ref Range: 0.4 - 1.24 MG/DL 8.29 5.62   eGFR Non African American Latest Ref Range: >60 mL/min 91.4 >60 eGFR African American Latest Ref Range: >60 mL/min  >60   Glucose Latest Ref Range: 70 - 100 MG/DL 130 (H) 865 (H)   Albumin Latest Ref Range: 3.5 - 5.0 G/DL 3.4 (L) 4.2   Calcium Latest Ref Range: 8.5 - 10.6 MG/DL 8.6 9.5   Magnesium Latest Ref Range: 1.6 - 2.6 mg/dL  1.7   Total Bilirubin Latest Ref Range: 0.3 - 1.2 MG/DL 8.8 0.4   Total Protein Latest Ref Range: 6.0 - 8.0 G/DL 7.3 7.8   AST (SGOT) Latest Ref Range: 7 - 40 U/L 20 14   ALT (SGPT) Latest Ref Range: 7 - 56 U/L 26 19   Alk Phosphatase Latest Ref Range: 25 - 110 U/L 61 52   TSH Unknown 1.02    Cholesterol Latest Ref Range: <200  265 (H)    Triglycerides Latest Ref Range: <150  499 (H)    HDL Unknown 49    LDL Unknown 97    VLDL Latest Ref Range: 5 - 40  100 (H)      Assessment and Plan:  1.Type 2  Diabetes Mellitus with complications, uncontrolled   HgbA1C  reported HbA1C around 10.2 early July 2020, previously HgbA1C 8.2 on 12/10/17,  He admitted that he  has not been taking insulin regularly nor eating healthy.  He is motivated to make changes for better glycemic control.  We will not adjust his dose of insulin today.  He is hoping to get bariatric surgery but will need substantially improvement of A1c prior to surgery.  Continue metformin 1000 mg twice daily and trulicity 1.5 mg weekly  Continue Lantus 60 units daily by 2 units twice a week to keep FBS less than 140 without low.   Continue humalog insulin 24 units with meals 3 times daily  Had diabetes education during most recent admission in spring 2019.  2  Diet and life style modifications  Discussed patient's BMI with him.  The body mass index is 57.94 kg/m???. and falls within the category of Obesity 3 (>40); BMI plan is in progress.  Referral for spine center due to chronic pain and back pain that debilitating.  3. Hypertension at goal less than 130/80.    4. Lipid  Lipitor 10 mg daily.  5.Diabetes microvascular complications: history of retinopathy and due for an eye exam.  No records of microalbumin over creatinine ratio.   6.Hypothyroid: Lt 4 50 mcg daily, most recent TSH of  1.02 on 06/14/18  7.  Erectile dysfunction this is multifactorial including chronic pain, uncontrolled diabetes, hypertension, depression, pain medication.  He was prescribed Viagra which has not helped.  Testosterone therapy is not a primary treatment for erectile dysfunction as well but addressing factors and weight loss could help.  8.  Hypogonadism most likely related to multiple factors mentioned above.  He is was started on testosterone therapy and will get labs through Dr. Andreas Newport.  Return to clinic in 3 months.    Total time reviewing chart and visit 30 minutes.  Estimated counseling time 20 minutes.

## 2018-09-08 ENCOUNTER — Encounter: Admit: 2018-09-08 | Discharge: 2018-09-08

## 2018-09-08 ENCOUNTER — Ambulatory Visit: Admit: 2018-09-08 | Discharge: 2018-09-09

## 2018-09-08 DIAGNOSIS — J438 Other emphysema: Secondary | ICD-10-CM

## 2018-09-08 DIAGNOSIS — E0829 Diabetes mellitus due to underlying condition with other diabetic kidney complication: Secondary | ICD-10-CM

## 2018-09-08 DIAGNOSIS — J45909 Unspecified asthma, uncomplicated: Secondary | ICD-10-CM

## 2018-09-08 DIAGNOSIS — J449 Chronic obstructive pulmonary disease, unspecified: Secondary | ICD-10-CM

## 2018-09-08 DIAGNOSIS — R7989 Other specified abnormal findings of blood chemistry: Secondary | ICD-10-CM

## 2018-09-08 DIAGNOSIS — G4730 Sleep apnea, unspecified: Secondary | ICD-10-CM

## 2018-09-08 DIAGNOSIS — E119 Type 2 diabetes mellitus without complications: Secondary | ICD-10-CM

## 2018-09-08 DIAGNOSIS — G4733 Obstructive sleep apnea (adult) (pediatric): Secondary | ICD-10-CM

## 2018-09-08 DIAGNOSIS — E1100 Type 2 diabetes mellitus with hyperosmolarity without nonketotic hyperglycemic-hyperosmolar coma (NKHHC): Secondary | ICD-10-CM

## 2018-09-08 DIAGNOSIS — F419 Anxiety disorder, unspecified: Secondary | ICD-10-CM

## 2018-09-08 DIAGNOSIS — E669 Obesity, unspecified: Secondary | ICD-10-CM

## 2018-09-08 DIAGNOSIS — G6289 Other specified polyneuropathies: Secondary | ICD-10-CM

## 2018-09-08 DIAGNOSIS — I10 Essential (primary) hypertension: Secondary | ICD-10-CM

## 2018-09-08 NOTE — Progress Notes
Did patient read financial policy, consent to treat, and notice of privacy practices? Yes    Does the patient give verbal consent to each policy? Yes    Does the patient have any vitals to report? Yes    Weight Vitals charted in O2? Yes    Is the patient in pain? 0 = No pain    Screening questions completed? Yes    Is the patient able to acces the Mychart message with the start visit link? Yes    Is patient in "virtual waiting room" Yes

## 2018-09-08 NOTE — Patient Instructions
For up to date information on the COVID-19 virus, visit the CDC website. https://www.cdc.gov/coronavirus   General supportive care during cold and flu season and infection prevention reminders:    o Wash hands often with soap and water for at least 20 seconds   o Cover your mouth and nose   o Social distancing: try to maintain 6 feet between you and other people   o Stay home if sick and symptoms mild or manageable?   If you must be around people wear a mask     If you are having symptoms of a lower respiratory infection (cough, shortness of breath) and/or fever AND either traveled in last 30 days (internationally or to region of exposure) OR known exposure to patient with COVID19:     o Call your primary care provider for questions or health needs.    Tell your doctor about your recent travel and your symptoms     o In a medical emergency, call 911 or go to the nearest emergency room.

## 2018-09-08 NOTE — Telephone Encounter
Outgoing call to assist with log in. Patient does not remember user name/password. Recommending doximity video.

## 2018-09-09 ENCOUNTER — Encounter: Admit: 2018-09-09 | Discharge: 2018-09-09

## 2018-09-09 ENCOUNTER — Ambulatory Visit: Admit: 2018-09-09 | Discharge: 2018-09-10

## 2018-09-09 DIAGNOSIS — J449 Chronic obstructive pulmonary disease, unspecified: Secondary | ICD-10-CM

## 2018-09-09 DIAGNOSIS — G8929 Other chronic pain: Secondary | ICD-10-CM

## 2018-09-09 DIAGNOSIS — E785 Hyperlipidemia, unspecified: Secondary | ICD-10-CM

## 2018-09-09 DIAGNOSIS — E669 Obesity, unspecified: Secondary | ICD-10-CM

## 2018-09-09 DIAGNOSIS — M545 Low back pain: Secondary | ICD-10-CM

## 2018-09-09 DIAGNOSIS — F324 Major depressive disorder, single episode, in partial remission: Secondary | ICD-10-CM

## 2018-09-09 DIAGNOSIS — I10 Essential (primary) hypertension: Secondary | ICD-10-CM

## 2018-09-09 DIAGNOSIS — N183 Chronic kidney disease, stage 3 (moderate): Secondary | ICD-10-CM

## 2018-09-09 DIAGNOSIS — E119 Type 2 diabetes mellitus without complications: Secondary | ICD-10-CM

## 2018-09-09 DIAGNOSIS — J438 Other emphysema: Secondary | ICD-10-CM

## 2018-09-09 DIAGNOSIS — R0683 Snoring: Secondary | ICD-10-CM

## 2018-09-09 DIAGNOSIS — J45909 Unspecified asthma, uncomplicated: Secondary | ICD-10-CM

## 2018-09-09 DIAGNOSIS — R7989 Other specified abnormal findings of blood chemistry: Secondary | ICD-10-CM

## 2018-09-09 DIAGNOSIS — G4733 Obstructive sleep apnea (adult) (pediatric): Secondary | ICD-10-CM

## 2018-09-09 DIAGNOSIS — G4730 Sleep apnea, unspecified: Secondary | ICD-10-CM

## 2018-09-09 NOTE — Telephone Encounter
Patient answered but said, " I'm not ready.  Call back in 5 minutes."  Called back, Left message that this is an attempt to start the visit for Dr Maretta Los that is due to start at 1:20.  Will call back.  Patient answered and visit strated

## 2018-09-10 DIAGNOSIS — E1165 Type 2 diabetes mellitus with hyperglycemia: Principal | ICD-10-CM

## 2018-09-18 NOTE — Progress Notes
Patient arrived to [place] [facility] for COVID-19 testing 09/18/18 1656. Patient identity confirmed via photo I.D. Nasopharyngeal procedure explained to the patient.   Nasopharyngeal swab completed left  Patient education provided given and instructed patient self isolate until contacted w/ results and further instructions.   Swab collected by [facility], [place].    Date symptoms began/reason for testing: Pre-op

## 2018-09-19 ENCOUNTER — Ambulatory Visit: Admit: 2018-09-18 | Discharge: 2018-09-19

## 2018-09-19 DIAGNOSIS — Z1159 Encounter for screening for other viral diseases: Principal | ICD-10-CM

## 2018-09-19 LAB — COVID-19 (SARS-COV-2) PCR

## 2018-09-20 ENCOUNTER — Ambulatory Visit: Admit: 2018-09-21 | Discharge: 2018-09-20

## 2018-09-20 ENCOUNTER — Encounter: Admit: 2018-09-20 | Discharge: 2018-09-20

## 2018-09-20 DIAGNOSIS — G4733 Obstructive sleep apnea (adult) (pediatric): Secondary | ICD-10-CM

## 2018-09-20 DIAGNOSIS — E0829 Diabetes mellitus due to underlying condition with other diabetic kidney complication: Secondary | ICD-10-CM

## 2018-09-20 DIAGNOSIS — G6289 Other specified polyneuropathies: Secondary | ICD-10-CM

## 2018-09-20 DIAGNOSIS — N183 Chronic kidney disease, stage 3 (moderate): Secondary | ICD-10-CM

## 2018-09-20 NOTE — Telephone Encounter
Pre Visit Planning- New Patient    Records received: Yes    Orders have been NA    Patient active in MyChart. No appointment reminder sent. .     Left voicemail message.    Updated chart: Not assessed    MRI PACS

## 2018-09-21 ENCOUNTER — Encounter: Admit: 2018-09-21 | Discharge: 2018-09-21

## 2018-09-21 ENCOUNTER — Ambulatory Visit: Admit: 2018-09-21 | Discharge: 2018-09-21

## 2018-09-21 DIAGNOSIS — G8929 Other chronic pain: Secondary | ICD-10-CM

## 2018-09-21 DIAGNOSIS — E119 Type 2 diabetes mellitus without complications: Secondary | ICD-10-CM

## 2018-09-21 DIAGNOSIS — J45909 Unspecified asthma, uncomplicated: Secondary | ICD-10-CM

## 2018-09-21 DIAGNOSIS — M545 Low back pain: Secondary | ICD-10-CM

## 2018-09-21 DIAGNOSIS — J438 Other emphysema: Secondary | ICD-10-CM

## 2018-09-21 DIAGNOSIS — R7989 Other specified abnormal findings of blood chemistry: Secondary | ICD-10-CM

## 2018-09-21 DIAGNOSIS — M5126 Other intervertebral disc displacement, lumbar region: Secondary | ICD-10-CM

## 2018-09-21 DIAGNOSIS — M48062 Spinal stenosis, lumbar region with neurogenic claudication: Principal | ICD-10-CM

## 2018-09-21 DIAGNOSIS — I10 Essential (primary) hypertension: Secondary | ICD-10-CM

## 2018-09-21 DIAGNOSIS — G4733 Obstructive sleep apnea (adult) (pediatric): Secondary | ICD-10-CM

## 2018-09-21 DIAGNOSIS — F324 Major depressive disorder, single episode, in partial remission: Secondary | ICD-10-CM

## 2018-09-21 DIAGNOSIS — G4730 Sleep apnea, unspecified: Secondary | ICD-10-CM

## 2018-09-21 DIAGNOSIS — E669 Obesity, unspecified: Secondary | ICD-10-CM

## 2018-09-21 DIAGNOSIS — E1165 Type 2 diabetes mellitus with hyperglycemia: Secondary | ICD-10-CM

## 2018-09-21 DIAGNOSIS — J449 Chronic obstructive pulmonary disease, unspecified: Secondary | ICD-10-CM

## 2018-09-21 MED ORDER — DICLOFENAC SODIUM 1 % TP GEL
4 g | Freq: Four times a day (QID) | TOPICAL | 3 refills | 19.00000 days | Status: AC
Start: 2018-09-21 — End: ?

## 2018-09-21 MED ORDER — TIZANIDINE 4 MG PO TAB
4 mg | ORAL_TABLET | ORAL | 3 refills | Status: DC | PRN
Start: 2018-09-21 — End: 2019-01-17
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
## 2018-10-03 ENCOUNTER — Ambulatory Visit: Admit: 2018-10-03 | Discharge: 2018-10-03 | Payer: MEDICAID

## 2018-10-04 ENCOUNTER — Ambulatory Visit: Admit: 2018-10-04 | Discharge: 2018-10-04 | Payer: MEDICAID

## 2018-10-04 ENCOUNTER — Encounter: Admit: 2018-10-04 | Discharge: 2018-10-04 | Payer: MEDICAID

## 2018-10-04 DIAGNOSIS — M48062 Spinal stenosis, lumbar region with neurogenic claudication: Principal | ICD-10-CM

## 2018-10-04 DIAGNOSIS — M5126 Other intervertebral disc displacement, lumbar region: Secondary | ICD-10-CM

## 2018-10-04 DIAGNOSIS — E669 Obesity, unspecified: Secondary | ICD-10-CM

## 2018-10-04 DIAGNOSIS — J45909 Unspecified asthma, uncomplicated: Secondary | ICD-10-CM

## 2018-10-04 DIAGNOSIS — M48061 Spinal stenosis, lumbar region without neurogenic claudication: Secondary | ICD-10-CM

## 2018-10-04 DIAGNOSIS — I10 Essential (primary) hypertension: Secondary | ICD-10-CM

## 2018-10-04 DIAGNOSIS — G4730 Sleep apnea, unspecified: Secondary | ICD-10-CM

## 2018-10-04 DIAGNOSIS — E119 Type 2 diabetes mellitus without complications: Secondary | ICD-10-CM

## 2018-10-04 DIAGNOSIS — R7989 Other specified abnormal findings of blood chemistry: Secondary | ICD-10-CM

## 2018-10-04 DIAGNOSIS — G4733 Obstructive sleep apnea (adult) (pediatric): Secondary | ICD-10-CM

## 2018-10-04 DIAGNOSIS — J449 Chronic obstructive pulmonary disease, unspecified: Secondary | ICD-10-CM

## 2018-10-04 MED ORDER — SODIUM CHLORIDE 0.9 % IJ SOLN
3 mL | Freq: Once | INTRAMUSCULAR | 0 refills | Status: CP
Start: 2018-10-04 — End: ?

## 2018-10-04 MED ORDER — IOHEXOL 300 MG IODINE/ML IV SOLN
2 mL | Freq: Once | 0 refills | Status: CP
Start: 2018-10-04 — End: ?

## 2018-10-04 MED ORDER — TRIAMCINOLONE ACETONIDE 40 MG/ML IJ SUSP
80 mg | Freq: Once | EPIDURAL | 0 refills | Status: CP
Start: 2018-10-04 — End: ?

## 2018-10-04 MED ORDER — LIDOCAINE (PF) 10 MG/ML (1 %) IJ SOLN
2 mL | Freq: Once | INTRAMUSCULAR | 0 refills | Status: CP
Start: 2018-10-04 — End: ?

## 2018-10-04 NOTE — Procedures
Attending Surgeon: Lizbeth Bark, MD    Anesthesia: Local    Pre-Procedure Diagnosis:   1. Lumbar stenosis with neurogenic claudication    2. Lumbar discogenic pain syndrome        Post-Procedure Diagnosis:   1. Lumbar stenosis with neurogenic claudication    2. Lumbar discogenic pain syndrome        [place] [facility] SPINE INJECT INTERLAM LMBR/SAC  Procedure: epidural - interlaminar    Laterality: n/a    Location: lumbar ESI with imaging - L5-S1      Consent:   Consent obtained: verbal and written  Consent given by: patient  Risks discussed: bleeding, bruising, infection, weakness and no change or worsening in pain    Discussed with patient the purpose of the treatment/procedure, other ways of treating my condition, including no treatment/ procedure and the risks and benefits of the alternatives. Patient has decided to proceed with treatment/procedure.        Universal Protocol:  Relevant documents: relevant documents present and verified  Test results: test results available and properly labeled  Imaging studies: imaging studies available  Required items: required blood products, implants, devices, and special equipment available  Site marked: the operative site was marked  Patient identity confirmed: Patient identify confirmed verbally with patient.        Time out: Immediately prior to procedure a time out was called to verify the correct patient, procedure, equipment, support staff and site/side marked as required      Procedures Details:   Indications: pain   Prep: chlorhexidine  Number of Levels: 1  Approach: midline  Guidance: fluoroscopy  Contrast: Procedure confirmed with contrast under live fluoroscopy.  Needle and Epidural Catheter: tuohy  Needle size: 18 G  Injection procedure: Negative aspiration for blood and Incremental injection  Patient tolerance: Patient tolerated the procedure well with no immediate complications. Pressure was applied, and hemostasis was accomplished. Comments: DESCRIPTION OF PROCEDURE:  The procedure risks and benefits were explained to the patient and informed consent was obtained.  The patient was positioned prone on the fluoroscopy table with a pillow under the abdomen to help reduce lumbar lordosis.  Blood pressure cuff and oxygen saturation monitors were attached and the patient was monitored throughout the entire procedure.  The L5 vertebral level was identified with the use of fluoroscopy in the AP view.  The skin was prepped using Chlorhexadine and draped in aseptic fashion.  The skin and subcutaneous tissue were anesthetized using 3 mL of 1 percent lidocaine with a 27-gauge needle.  A 3.5 inch 22-gauge Tuohy needle was slowly advanced using AP view towards the midline L5-S1 epidural space.  The latter part of the needle advancement was guided with fluoroscopy in the lateral view.  The epidural space was identified using loss of resistance technique.  After negative aspiration, 1 mL of Isovue contrast dye was injected.  After epidural spread was seen, a solution containing 80 mg of triamcinolone and 2 mL of normal saline was injected in increments.  The stylet was reinserted and the needle was then removed.     After the procedure, the patient's blood pressure, heart rate, oxygen saturation, and VAS were recorded in the chart. There were no complications.  The patient tolerated the procedure well and was brought to recovery room for observation in stable condition and discharged with written discharge instructions.     PLAN OF CARE:  The patient is to followup in 6 weeks.    The patient was advised to  contact the [facility] for any of the following    1. Fever, chills, or night sweats.  2. New onset of severe sharp pain.  3. Any new upper or lower extremity weakness or numbness.  4. Any questions regarding the procedure.        Estimated blood loss: none or minimal  Specimens: none Patient tolerated the procedure well with no immediate complications. Pressure was applied, and hemostasis was accomplished.

## 2018-10-04 NOTE — Progress Notes
SPINE CENTER  INTERVENTIONAL PAIN PROCEDURE HISTORY AND PHYSICAL    No chief complaint on file.      HISTORY OF PRESENT ILLNESS:  Thomas Francis is a 48 y.o. year old male who presents for injection.  Denies fevers, chills, or recent hospitalizations.  No allergies to contrast, latex, seafood, iodine, or shellfish.  Patient denies blood thinning medications.        Medical History:   Diagnosis Date   ??? Asthma    ??? COPD (chronic obstructive pulmonary disease) (HCC)    ??? DM (diabetes mellitus) (HCC)    ??? Hypertension    ??? Low testosterone    ??? Obesity    ??? Obstructive sleep apnea    ??? Sleep apnea        Surgical History:   Procedure Laterality Date   ??? TRANSURETHRAL DRAINAGE ABSCESS - PROSTATE N/A 06/01/2017    Performed by Eveline Keto, MD at [facility] OR   ??? ANGIOGRAPHY CORONARY ARTERY WITH LEFT HEART CATHETERIZATION N/A 07/14/2018    Performed by Myriam Jacobson, MD at [facility] CATH LAB   ??? POSSIBLE PERCUTANEOUS CORONARY STENT PLACEMENT WITH ANGIOPLASTY N/A 07/14/2018    Performed by Myriam Jacobson, MD at [facility] CATH LAB   ??? TONSILLECTOMY         family history includes Heart Disease in his father.    Social History     Socioeconomic History   ??? Marital status: Single     Spouse name: Not on file   ??? Number of children: Not on file   ??? Years of education: Not on file   ??? Highest education level: Not on file   Occupational History   ??? Not on file   Tobacco Use   ??? Smoking status: Former Smoker     Types: Cigarettes   ??? Smokeless tobacco: Current User     Types: Chew   Substance and Sexual Activity   ??? Alcohol use: Yes     Frequency: 2-4 times a month     Drinks per session: 7 to 9     Binge frequency: Monthly     Comment: social drinker   ??? Drug use: Never   ??? Sexual activity: Not on file   Other Topics Concern   ??? Not on file   Social History Narrative   ??? Not on file       No Known Allergies    Vitals:    10/04/18 1032   Temp: 36 ???C (96.8 ???F)       REVIEW OF SYSTEMS: 10 point ROS obtained and negative except back pain PHYSICAL EXAM:  General: 48 y.o. male appears stated age, in no acute distress  HEENT: Normocephalic, atraumatic  Neck: No thyroidmegaly  Cardiovascular: Well perfused  Pulmonary: Unlabored respirations  Extremities: No cyanosis, clubbing, or edema  Skin: No lesions seen on exposed skin  Psychiatric:  Appropriate mood and affect  Musculoskeletal: No atrophy.   Neurologic: Antigravity strength in all extremities. CN II -XII grossly intact.  Alert and oriented x 3.           IMPRESSION:    1. Lumbar stenosis with neurogenic claudication    2. Lumbar discogenic pain syndrome         PLAN: Lumbar Epidural Steroid Injection L5-S1

## 2018-10-04 NOTE — Discharge Instructions - Supplementary Instructions
..  GENERAL POST PROCEDURE INSTRUCTIONS    Time:____________________  Physician:_________________________________    Procedure Completed Today:  o Joint Injection (hip, knee, shoulder)  o Cervical Epidural Steroid Injection  o Cervical Transforaminal Steroid Injection  o Trigger Point Injection  o Other___________________________ o Thoracic Epidural Steroid Injection  o Lumbar Epidural Steroid Injection  o Lumbar Transforaminal Steroid Injection  o Facet Joint Injection     Important information following your procedure today:  ? You may drive today     ? If you had sedation ,you may NOT drive today  ? Rest at home for the next 6 hours.  You may then begin to resume your normal activities.  ? DO NOT drive any vehicle, operate any power tools, drink alcohol, make any major decisions, or sign any legal documents for the next 12 hours.  1. Pain relief may not be immediate. It is possible you may even experience an increase in pain during the first 24-48 hours followed by a gradual decrease of your pain.  2. Though the procedure is generally safe and complications are rare, we do ask that you be aware of any of the following:  ? Any swelling, persistent redness, new bleeding or drainage from the site of the injection.  ? You should not experience a severe headache.  ? You should not run a fever over 101oF.  ? New onset of sharp, severe back and or neck pain.  ? New onset of upper or lower extremity numbness or weakness.  ? New difficulty controlling bowel or bladder function after injection.  ? New shortness of breath.  If any of these occur, please call to report this occurrence to a nurse at [phone]. If you are calling after 4:00pm, on the weekends or holidays please call [phone] and ask to have the pain management resident physician on call for the physician paged or go to your local emergency room.   3. You may experience soreness at the injection site. Ice can be applied at 20 minute intervals for the first 24 hours. The following day you may alternate ice with heat if you are experiencing muscle tightness, otherwise continue with ice. Ice works best at decreasing pain. Avoid application of direct heat, hot showers or hot tubs today.  4. Avoid strenuous activity today. You many resume your regular activities and exercise tomorrow.  5. Patients with diabetes may see an elevation in blood sugars for 7-10 days after the injection. It is important to pay close attention to you diet, check your blood sugars daily and repost extreme elevations to the physician that treats your diabetes.  6. Patients taking daily blood thinners can resume their regular dose this evening.  7. It is important that you take all medications ordered by your pain physician. Taking medications as ordered is an important part of you pain care plan. If you cannot continue the medication plan, please notify the physician.  Possible side effects to steroids that may occur:  ? Flushing or redness of the face  ? Irritability  ? Fluid retention  ? Change in women's menses  Follow up appointment as needed if in the event you are unable to keep an appointment please notify the scheduler 24 hours in advance at [phone].    ____________________________________  ____________________________________

## 2018-10-07 ENCOUNTER — Encounter: Admit: 2018-10-07 | Discharge: 2018-10-07

## 2018-10-07 NOTE — Telephone Encounter
Patient called to inquire about his sleep study results.     Routing to provider.

## 2018-10-10 ENCOUNTER — Encounter: Admit: 2018-10-10 | Discharge: 2018-10-10

## 2018-10-14 ENCOUNTER — Encounter: Admit: 2018-10-14 | Discharge: 2018-10-14

## 2018-10-14 DIAGNOSIS — G4733 Obstructive sleep apnea (adult) (pediatric): Secondary | ICD-10-CM

## 2018-10-14 DIAGNOSIS — F324 Major depressive disorder, single episode, in partial remission: Secondary | ICD-10-CM

## 2018-10-14 NOTE — Telephone Encounter
Order for AutoPAP 10-20cm H2O placed per recommendations.   DME: Barbara Cower  Will continue to follow up.     Faxed to DME 10/14/18

## 2018-10-14 NOTE — Progress Notes
Spoke with patient this am regarding results of PFTs and sleep study. Discussed need for PAP initiation. Discussed starting PAP and encouraged mask desensitization.   Pt also verbalized that he has not yet heard from psychiatry and is interested in moving up his appt with bariatric surgery. I emailed Dr Nicholes Calamity and also placed an order for psych eval (I believe this was already done but not certain).   PAP orders for APAP 10-20cmH2O.   All questions answered.   NS

## 2018-10-26 ENCOUNTER — Encounter: Admit: 2018-10-26 | Discharge: 2018-10-26

## 2018-10-26 DIAGNOSIS — M79671 Pain in right foot: Secondary | ICD-10-CM

## 2018-11-14 ENCOUNTER — Encounter

## 2018-11-14 DIAGNOSIS — M79671 Pain in right foot: Secondary | ICD-10-CM

## 2018-11-17 ENCOUNTER — Encounter: Admit: 2018-11-17 | Discharge: 2018-11-17 | Payer: MEDICAID

## 2018-11-17 NOTE — Telephone Encounter
Per [facility], patient has not been set up yet. PA was approved and they are waiting for patient to call back.   Patient needs to use the PAP for at least 30 days before his compliance appointment. Left message for patient to call back and reschedule.

## 2018-11-18 ENCOUNTER — Encounter: Admit: 2018-11-18 | Discharge: 2018-11-18 | Payer: MEDICAID

## 2018-11-18 NOTE — Telephone Encounter
Recieved call from [place] that they need CMN for pt's CPAP sent back ASAP.     RN will verify if has been received.

## 2018-11-22 ENCOUNTER — Encounter: Admit: 2018-11-22 | Discharge: 2018-11-22 | Payer: MEDICAID

## 2018-11-23 NOTE — Telephone Encounter
Received request from DME for provider to sign form necessary for billing. Form completed and given to provider for signature.    Form will be faxed to [facility] and a copy scanned to pt's chart.

## 2018-11-25 NOTE — Progress Notes
Erroneous encounter.  Pt did not answer for his tele health visit on 2 attempts.     Gretta Arab PA-c

## 2018-11-28 ENCOUNTER — Encounter: Admit: 2018-11-28 | Discharge: 2018-11-28 | Payer: MEDICAID

## 2018-11-28 ENCOUNTER — Ambulatory Visit: Admit: 2018-11-28 | Discharge: 2018-11-29 | Payer: MEDICAID

## 2018-11-28 DIAGNOSIS — E119 Type 2 diabetes mellitus without complications: Secondary | ICD-10-CM

## 2018-11-28 DIAGNOSIS — E669 Obesity, unspecified: Secondary | ICD-10-CM

## 2018-11-28 DIAGNOSIS — R7989 Other specified abnormal findings of blood chemistry: Secondary | ICD-10-CM

## 2018-11-28 DIAGNOSIS — G5710 Meralgia paresthetica, unspecified lower limb: Secondary | ICD-10-CM

## 2018-11-28 DIAGNOSIS — G4733 Obstructive sleep apnea (adult) (pediatric): Secondary | ICD-10-CM

## 2018-11-28 DIAGNOSIS — M48062 Spinal stenosis, lumbar region with neurogenic claudication: Secondary | ICD-10-CM

## 2018-11-28 DIAGNOSIS — M5126 Other intervertebral disc displacement, lumbar region: Secondary | ICD-10-CM

## 2018-11-28 DIAGNOSIS — I10 Essential (primary) hypertension: Secondary | ICD-10-CM

## 2018-11-28 DIAGNOSIS — G4730 Sleep apnea, unspecified: Secondary | ICD-10-CM

## 2018-11-28 DIAGNOSIS — J449 Chronic obstructive pulmonary disease, unspecified: Secondary | ICD-10-CM

## 2018-11-28 DIAGNOSIS — J45909 Unspecified asthma, uncomplicated: Secondary | ICD-10-CM

## 2018-11-28 NOTE — Progress Notes
Obtained patient's verbal consent to treat them and their agreement to [facility] financial policy and NPP via this telehealth visit during the [facility]    Date of Service: 11/28/2018        History of Present Illness    Thomas Francis is a 48 y.o. male.  Who is presenting in follow-up from last visit on 10/04/2018 where he received a L5-S1 lumbar epidural steroid injection.  Patient reports great relief of the back and bilateral leg pain for about 1.5 to 2 months.  Unfortunately he suffered a loss as his healthy 9 year old sister has recently passed away so he has never focused on his back pain.  He reports the pain is similar as previous.  Midline low back radiating down bilateral legs, left little bit worse than right.  He reports the pain is chronic in nature, achy, sharp shooting.  Pain is made worse with activity, pain is made better with rest laying down.  He is interested in repeating epidural steroid injection as it gave him good relief for a while.       Review of Systems   Constitutional: Negative.    HENT: Negative.    Eyes: Negative.    Endocrine: Negative.    Genitourinary: Negative.    Musculoskeletal: Positive for back pain.   Neurological: Positive for numbness.   Hematological: Negative.          Objective:         ? aspirin EC 81 mg tablet Take 81 mg by mouth daily. Take with food.   ? atorvastatin (LIPITOR) 10 mg tablet Take two tablets by mouth daily.   ? carvediloL (COREG) 25 mg tablet Take one tablet by mouth twice daily. Take with food.   ? citalopram (CELEXA) 40 mg tablet Take 40 mg by mouth daily.   ? diclofenac (VOLTAREN) 1 % topical gel Apply four g topically to affected area four times daily.   ? dulaglutide (TRULICITY) 1.5 mg/0.5 mL injection pen Inject 1.5 mg under the skin every 7 days.   ? fluticasone propionate (FLONASE) 50 mcg/actuation nasal spray Apply 1 spray to each nostril as directed twice daily. Shake bottle gently before using. ? fluticasone propionate (FLOVENT HFA) 110 mcg/actuation inhaler Inhale 2 puffs by mouth into the lungs twice daily.   ? fluticasone-salmeterol (ADVAIR DISKUS) 250-50 mcg inhalation disk Inhale one puff by mouth into the lungs twice daily.   ? gabapentin (NEURONTIN) 400 mg capsule Take two capsules by mouth every 8 hours.   ? glucosamine(+) 500 mg tab Take one tablet by mouth daily.   ? guaiFENesin LA (MUCINEX) 600 mg tablet Take one tablet by mouth twice daily. (Patient taking differently: Take 600 mg by mouth twice daily as needed.)   ? insulin glargine (LANTUS SOLOSTAR) 100 unit/mL (3 mL) injection PEN Inject 52 Units under the skin at bedtime daily.   ? insulin regular (HUMULIN R) 100 unit/mL soln injection 18 u tid with meals and sliding scale. (Patient taking differently: 24 Units. 24 u tid with meals and sliding scale.)   ? lactobacillus rhamnosus (GG) (CULTURELLE) 10 billion cell cap Take one capsule by mouth daily with breakfast.   ? levothyroxine (SYNTHROID) 50 mcg tablet Take 50 mcg by mouth daily 30 minutes before breakfast.   ? lisinopril (ZESTRIL) 5 mg tablet Take one tablet by mouth daily.   ? melatonin 10 mg tab Take one tablet by mouth at bedtime as needed.   ? metFORMIN (GLUCOPHAGE) 1,000  mg tablet Take one tablet by mouth twice daily with meals. Restart 07/17/18 with morning dose.   ? methocarbamoL (ROBAXIN) 750 mg tablet Take 750 mg by mouth twice daily.   ? omega-3s-dha-epa-fish oil (FISH OIL) 120 mg-180 mg- 60 mg-1,200 mg cpDR Take 1 capsule by mouth daily.   ? oxyCODONE-acetaminophen(+) (PERCOCET) 7.5-325 mg tablet Take 1-2 tablets by mouth every 6 hours as needed   ? sildenafil(+) (VIAGRA) 100 mg tablet Take one tablet by mouth as Needed for Erectile dysfunction. 30-60 min prior to sexual activity   ? tamsulosin (FLOMAX) 0.4 mg capsule Take one capsule by mouth twice daily. Do not crush, chew or open capsules. Take 30 minutes following the same meal each day. ? testosterone cypionate (DEPO-TESTOSTERONE) 200 mg/mL injection Inject 200 mg into the muscle every 14 days.   ? tiZANidine (ZANAFLEX) 4 mg tablet Take one tablet by mouth every 6 hours as needed.     Vitals:    11/28/18 1000   Weight: (!) 167.8 kg (370 lb)   Height: 170.2 cm (67)   PainSc: Seven     Body mass index is 57.95 kg/m?Marland Kitchen     Physical Exam  Ortho Exam  This visit was conducted by means of a two-way synchronous audio-video connection    General: 48 y.o. year old who appears stated age, in no acute distress  Behavior: cooperative, fully engaged, good eye contact  Speech: normal volume, rate and tone  Mood: Euthymic, no SI or HI verbalized at this time  Affect: Congruent to mood and the context of the discussion  Orientation: Alert and oriented  HEENT: Normocephalic, atraumatic  Resp: Unlabored respirations, no audible cough or wheezing  Extremities: No cyanosis, clubbing, or edema  Skin: No visible rashes, wounds, or ecchymosis   Musculoskeletal: Full functional ROM, no gross deformities   Gait: Able to walk without assistive device, able to heel and toe walk  Neurologic: Sensation grossly intact per patient in C3-T1 dermatomes   Cranial nerves:  CN II - pupils equal, round    CN III, IV, VI - EOMI, no evidence of nystagmus, no ptosis, pupils are symmetric  CN VII - no facial droop/asymmetry, smile symmetric  CN VIII - hearing intact to normal voice  CN IX, X - no hoarseness/nasal voice  CN XII - tongue midline           Assessment and Plan:    48 year old male with low back and bilateral leg pain.  His diagnosis is lumbar disc herniation with lumbar stenosis causing bilateral neurogenic claudication status post epidural steroid injection with good relief for about a month and a half.  He is also reporting left lateral thigh numbness and tingling, tenderness to light touch.  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 Plan is to repeat epidural steroid injection to see if he gets longer relief this time.  He was instructed to continue his exercise program.  He was instructed to wear looser fitting clothing to see if this helps with his lateral thigh neuropathy.    Total time 15 minutes.  Estimated counseling time 10 minutes.

## 2018-11-29 ENCOUNTER — Encounter: Admit: 2018-11-29 | Discharge: 2018-11-29 | Payer: MEDICAID

## 2018-11-29 NOTE — Telephone Encounter
Spoke with pt, confirmed date, time and location of upcoming appointment with Jody. Pt denies being hospitalized since last seen by Jody on 08/11/18. Pt request his appointment be changed to telehealth. Pt instructed to take BP tomorrow prior to appointment, pt denies having a BP cuff.

## 2018-11-30 ENCOUNTER — Ambulatory Visit: Admit: 2018-11-30 | Discharge: 2018-11-30 | Payer: MEDICAID

## 2018-11-30 DIAGNOSIS — Z09 Encounter for follow-up examination after completed treatment for conditions other than malignant neoplasm: Secondary | ICD-10-CM

## 2018-12-15 ENCOUNTER — Encounter: Admit: 2018-12-15 | Discharge: 2018-12-15 | Payer: MEDICAID

## 2018-12-15 ENCOUNTER — Ambulatory Visit: Admit: 2018-12-15 | Discharge: 2018-12-16 | Payer: MEDICAID

## 2018-12-15 DIAGNOSIS — G4733 Obstructive sleep apnea (adult) (pediatric): Secondary | ICD-10-CM

## 2018-12-15 DIAGNOSIS — Z1159 Encounter for screening for other viral diseases: Secondary | ICD-10-CM

## 2018-12-15 DIAGNOSIS — I10 Essential (primary) hypertension: Secondary | ICD-10-CM

## 2018-12-15 DIAGNOSIS — M545 Low back pain: Secondary | ICD-10-CM

## 2018-12-15 DIAGNOSIS — G4730 Sleep apnea, unspecified: Secondary | ICD-10-CM

## 2018-12-15 DIAGNOSIS — K219 Gastro-esophageal reflux disease without esophagitis: Secondary | ICD-10-CM

## 2018-12-15 DIAGNOSIS — J449 Chronic obstructive pulmonary disease, unspecified: Secondary | ICD-10-CM

## 2018-12-15 DIAGNOSIS — E1100 Type 2 diabetes mellitus with hyperosmolarity without nonketotic hyperglycemic-hyperosmolar coma (NKHHC): Secondary | ICD-10-CM

## 2018-12-15 DIAGNOSIS — E669 Obesity, unspecified: Secondary | ICD-10-CM

## 2018-12-15 DIAGNOSIS — E119 Type 2 diabetes mellitus without complications: Secondary | ICD-10-CM

## 2018-12-15 DIAGNOSIS — R7989 Other specified abnormal findings of blood chemistry: Secondary | ICD-10-CM

## 2018-12-15 DIAGNOSIS — J45909 Unspecified asthma, uncomplicated: Secondary | ICD-10-CM

## 2018-12-15 NOTE — Telephone Encounter
Called patient and left voicemail in regards to appointment today with our department. Left message to verify if patient is using nicotine products. Voicemail stated if patient planning to come to appointment will need to be nicotine free.

## 2018-12-16 ENCOUNTER — Ambulatory Visit: Admit: 2018-12-16 | Discharge: 2018-12-16 | Payer: MEDICAID

## 2018-12-16 DIAGNOSIS — G8929 Other chronic pain: Secondary | ICD-10-CM

## 2018-12-16 DIAGNOSIS — K219 Gastro-esophageal reflux disease without esophagitis: Secondary | ICD-10-CM

## 2018-12-16 DIAGNOSIS — Z6841 Body Mass Index (BMI) 40.0 and over, adult: Secondary | ICD-10-CM

## 2018-12-16 DIAGNOSIS — Z87891 Personal history of nicotine dependence: Secondary | ICD-10-CM

## 2018-12-16 DIAGNOSIS — I10 Essential (primary) hypertension: Secondary | ICD-10-CM

## 2018-12-22 ENCOUNTER — Ambulatory Visit: Admit: 2018-12-22 | Discharge: 2018-12-23 | Payer: MEDICAID

## 2018-12-22 ENCOUNTER — Encounter: Admit: 2018-12-22 | Discharge: 2018-12-22 | Payer: MEDICAID

## 2018-12-22 NOTE — Progress Notes
Clinical Nutrition Note    Thomas Francis is a 48 y.o. male with history of obesity considering bariatric surgery. Patient was seen in the context of a new patient group telehealth class to review bariatric surgery nutrition requirements.     Provided nutrition education for pre- and post-bariatric surgery:   Ten day pre-op diet; diet/lifestyle habits to practice now to prepare for surgery   Six week post-op diet advancement; stages and allowed foods   Protein requirements; protein shake/powder recommendations   Discussed the bariatric plate for proper portioning, meal planning and choosing high quality foods.   Recommendations for avoidance of dumping syndrome, dehydration, nausea/vomiting, and constipation   No fluids with meals, avoid concentrated sweets and carbonation   Mindful eating practices such as chewing well, small bites, slow eating, physical vs emotional hunger   Food label reading and heart healthy cooking tips   Recommended bariatric vitamin/mineral supplements     Group session included time for patient questions and individual nutrition counseling sessions were offered. Patient received written materials on topics discussed as well as dietitian contact info.    If patient proceeds with surgery, RD to follow up at 6 weeks post-op or PRN.    Wilhemina Bonito, MS, RD, LD  Virtual phone: [phone]

## 2018-12-27 ENCOUNTER — Encounter: Admit: 2018-12-27 | Discharge: 2018-12-27 | Payer: MEDICAID

## 2018-12-27 DIAGNOSIS — M48062 Spinal stenosis, lumbar region with neurogenic claudication: Secondary | ICD-10-CM

## 2019-01-03 ENCOUNTER — Encounter: Admit: 2019-01-03 | Discharge: 2019-01-03 | Payer: MEDICAID

## 2019-01-03 NOTE — Telephone Encounter
Patient called to inquire about his psychiatry referral. RN provided psychiatry contact information.

## 2019-01-05 NOTE — Pre-Anesthesia Patient Instructions
GENERAL INFORMATION    Before you come to the hospital  Make arrangements for a responsible adult to drive you home and stay with you for 24 hours following surgery.  Bath/Shower Instructions  Take a bath or shower using the special soap given to you in PAC. Use half the bottle the night before, and the other half the morning of your procedure. Use clean towels with each bath or shower.  Put on clean clothes after bath or shower.  Avoid using lotion and oils.  If you are having surgery above the waist, wear a shirt that fastens up the front.  Sleep on clean sheets if bath or shower is done the night before procedure.  Leave money, credit cards, jewelry, and any other valuables at home. The [facility] is not responsible for the loss or breakage of personal items.  Remove nail polish, makeup and all jewelry (including piercings) before coming to the hospital.  The morning of your procedure:  brush your teeth and tongue  do not smoke  do not shave the area where you will have surgery    What to bring to the hospital  ID/ Insurance Card  Medical Device card  Official documents for legal guardianship   Copy of your Living Will, Advanced Directives, and/or Durable Power of Attorney   Small bag with a few personal belongings  CPAP/BiPAP machine (including all supplies)  Walker,cane, or motorized scooter  Cases for glasses/hearing aids/contact lens (bring solutions for contacts)  Dress in clean, loose, comfortable clothing     Eating or drinking before surgery  Do not eat or drink anything after 11:00 p.m. the day before your procedure (including gum, mints, candy, or chewing tobacco) OR follow the specific instructions you were given by your Surgeon.     Other instructions  Notify your surgeon if:  there is a possibility that you are pregnant  you become ill with a cough, fever, sore throat, nausea, vomiting or flu-like symptoms you have any open wounds/sores that are red, painful, draining, or are new since you last saw  the doctor  you need to cancel your procedure    Notify us at [place]: [phone]  if you need to cancel your procedure  if you are going to be late    Arrival at the hospital    Arrival at the hospital:  [place] - Your surgery is scheduled on 01/17/2019 at 12:30 p.m.  Please arrive at 11:00 a.m.  The [address] is located at [employer]. This is at the [employer] of [address]  Use the main entrance of the hospital, at the east side of the building. Parking is free.  Check-in for surgery is inside the main entrance.     For the safety of all patients, visitors and staff as we work to contain COVID-19, we must dramatically restrict patient visitors.      Current Visitor Policy (07/21/18):  One visitor per patient per day.  Exceptions include:    Two parents/guardian for patients younger than 18.  End-of-life patients may be allowed additional support persons.  Visitors should check with the patient's nurse.  Only cancer patients at their exam visit may have one visitor with them.  No visitors are allowed for cancer patients receiving treatment/infusion services.  This applies at all cancer center locations.  Restrictions still apply for patients who test positive for COVID-19 (no visitors).    Visitors will continue  to be screened at all entrances.  They must be free of fever and symptoms to be in our facilities.  We ask visitors to follow these guidelines:  Wear a mask at all times.  Go directly to the nursing station in the unit you are visiting and do not linger in public areas.  Check in at the nursing station before going to the patient's room.  Maintain a physical distance of six feet from all others.  Follow elevator restrictions to four riding at a time - peak times are 6:30-7:30 a.m., noon and 6:30-7:30 p.m. Be aware cafeteria peak times are 11 a.m. - 1 p.m.  Wash your hands frequently and cover your coughs and sneezes.     You will need to have a COVID19 test performed 2-3 days prior to your surgery.  Your COVID19 test is scheduled on 01/14/2019 at 2:00 p.m.  Your test will be performed at a drive-thru clinic at the [facility].  If you are also planning to have lab work drawn while at the campus, please do so first and then have your COVID19 test completed second.    Main Campus/Student Center  [address]  [place], [place] [address]    Please bring a cell phone, your photo ID and your insurance card.  Please use the bathroom prior to your trip to the [facility] for your test.    Once arrived at the [facility] drive-thru clinic, follow the signage/cones to a parking spot.  Park and call [phone] to check in.  The team will come out to you.  You do not have to get out of your vehicle.  Our team members will collect the test sample using a swab and will be wearing all of the necessary personal protective equipment, so you do not need to wear a mask.  Once the test is performed it will be important to self quarantine at home until your surgery.  Please stay at home, wash your hands frequently, and practice physical distancing.

## 2019-01-09 ENCOUNTER — Encounter: Admit: 2019-01-09 | Discharge: 2019-01-09 | Payer: MEDICAID

## 2019-01-09 ENCOUNTER — Ambulatory Visit: Admit: 2019-01-09 | Discharge: 2019-01-09 | Payer: MEDICAID

## 2019-01-09 DIAGNOSIS — G4730 Sleep apnea, unspecified: Secondary | ICD-10-CM

## 2019-01-09 DIAGNOSIS — G629 Polyneuropathy, unspecified: Secondary | ICD-10-CM

## 2019-01-09 DIAGNOSIS — G4733 Obstructive sleep apnea (adult) (pediatric): Secondary | ICD-10-CM

## 2019-01-09 DIAGNOSIS — J449 Chronic obstructive pulmonary disease, unspecified: Secondary | ICD-10-CM

## 2019-01-09 DIAGNOSIS — J45909 Unspecified asthma, uncomplicated: Secondary | ICD-10-CM

## 2019-01-09 DIAGNOSIS — Z01810 Encounter for preprocedural cardiovascular examination: Secondary | ICD-10-CM

## 2019-01-09 DIAGNOSIS — E119 Type 2 diabetes mellitus without complications: Secondary | ICD-10-CM

## 2019-01-09 DIAGNOSIS — R7989 Other specified abnormal findings of blood chemistry: Secondary | ICD-10-CM

## 2019-01-09 DIAGNOSIS — Z01818 Encounter for other preprocedural examination: Secondary | ICD-10-CM

## 2019-01-09 DIAGNOSIS — E669 Obesity, unspecified: Secondary | ICD-10-CM

## 2019-01-09 DIAGNOSIS — M545 Low back pain: Secondary | ICD-10-CM

## 2019-01-09 DIAGNOSIS — I10 Essential (primary) hypertension: Secondary | ICD-10-CM

## 2019-01-09 DIAGNOSIS — E1100 Type 2 diabetes mellitus with hyperosmolarity without nonketotic hyperglycemic-hyperosmolar coma (NKHHC): Secondary | ICD-10-CM

## 2019-01-09 DIAGNOSIS — E039 Hypothyroidism, unspecified: Secondary | ICD-10-CM

## 2019-01-10 ENCOUNTER — Encounter: Admit: 2019-01-10 | Discharge: 2019-01-10 | Payer: MEDICAID

## 2019-01-10 LAB — COMPREHENSIVE METABOLIC PANEL
Lab: 0.4 mg/dL (ref 0.3–1.2)
Lab: 0.9 mg/dL (ref 0.4–1.24)
Lab: 101 MMOL/L (ref 98–110)
Lab: 12 (ref 3–12)
Lab: 12 U/L (ref 7–56)
Lab: 136 MMOL/L — ABNORMAL LOW (ref 137–147)
Lab: 15 mg/dL (ref 7–25)
Lab: 160 mg/dL — ABNORMAL HIGH (ref 70–100)
Lab: 17 U/L (ref 7–40)
Lab: 23 MMOL/L (ref 21–30)
Lab: 4 MMOL/L (ref 3.5–5.1)
Lab: 4 g/dL (ref 3.5–5.0)
Lab: 47 U/L (ref 25–110)
Lab: 7.4 g/dL (ref 6.0–8.0)
Lab: 9.6 mg/dL (ref 8.5–10.6)
Lab: >60 mL/min (ref >60)
Lab: >60 mL/min (ref >60)

## 2019-01-10 LAB — IRON + BINDING CAPACITY + %SAT+ FERRITIN
Lab: 18 % — ABNORMAL LOW (ref <100)
Lab: 56 ng/mL (ref 30–300)
Lab: 65 ug/dL — ABNORMAL HIGH (ref <150)

## 2019-01-10 LAB — VITAMIN B12: Lab: 192 pg/mL (ref 180–914)

## 2019-01-10 LAB — LIPID PROFILE
Lab: 170 mg/dL
Lab: 216 mg/dL — ABNORMAL HIGH (ref <200)

## 2019-01-10 LAB — FOLATE, SERUM: Lab: 7.7 ng/mL (ref >3.9)

## 2019-01-10 NOTE — Telephone Encounter
Carson in Pre-op assessment wanted to update Dr Raliegh Ip on patient.  He was in for assessment before his EGD evaluation prior to bariatric surgery.  He informed Gareth Eagle that he can't afford his Trulicity, Humalog or Lantus.  He has been buying Reg and NPH over the counter and taking that.  He is taking 40 units BID of NPH and 24 units of regular TID.Marland Kitchen

## 2019-01-17 ENCOUNTER — Encounter: Admit: 2019-01-17 | Discharge: 2019-01-17 | Payer: MEDICAID

## 2019-01-17 ENCOUNTER — Ambulatory Visit: Admit: 2019-01-17 | Discharge: 2019-01-17 | Payer: MEDICAID

## 2019-01-17 DIAGNOSIS — M48062 Spinal stenosis, lumbar region with neurogenic claudication: Secondary | ICD-10-CM

## 2019-01-17 DIAGNOSIS — J449 Chronic obstructive pulmonary disease, unspecified: Secondary | ICD-10-CM

## 2019-01-17 DIAGNOSIS — Z01818 Encounter for other preprocedural examination: Secondary | ICD-10-CM

## 2019-01-17 DIAGNOSIS — J45909 Unspecified asthma, uncomplicated: Secondary | ICD-10-CM

## 2019-01-17 DIAGNOSIS — K219 Gastro-esophageal reflux disease without esophagitis: Secondary | ICD-10-CM

## 2019-01-17 DIAGNOSIS — G629 Polyneuropathy, unspecified: Secondary | ICD-10-CM

## 2019-01-17 DIAGNOSIS — R7989 Other specified abnormal findings of blood chemistry: Secondary | ICD-10-CM

## 2019-01-17 DIAGNOSIS — E039 Hypothyroidism, unspecified: Secondary | ICD-10-CM

## 2019-01-17 DIAGNOSIS — E119 Type 2 diabetes mellitus without complications: Secondary | ICD-10-CM

## 2019-01-17 DIAGNOSIS — I10 Essential (primary) hypertension: Secondary | ICD-10-CM

## 2019-01-17 DIAGNOSIS — E669 Obesity, unspecified: Secondary | ICD-10-CM

## 2019-01-17 DIAGNOSIS — G4733 Obstructive sleep apnea (adult) (pediatric): Secondary | ICD-10-CM

## 2019-01-17 DIAGNOSIS — Z20828 Contact with and (suspected) exposure to other viral communicable diseases: Secondary | ICD-10-CM

## 2019-01-17 DIAGNOSIS — G4730 Sleep apnea, unspecified: Secondary | ICD-10-CM

## 2019-01-17 DIAGNOSIS — M5126 Other intervertebral disc displacement, lumbar region: Secondary | ICD-10-CM

## 2019-01-17 MED ORDER — IOHEXOL 300 MG IODINE/ML IV SOLN
2 mL | Freq: Once | 0 refills | Status: CP
Start: 2019-01-17 — End: ?

## 2019-01-17 MED ORDER — TIZANIDINE 4 MG PO TAB
4 mg | ORAL_TABLET | ORAL | 3 refills | Status: AC | PRN
Start: 2019-01-17 — End: ?

## 2019-01-17 MED ORDER — TRIAMCINOLONE ACETONIDE 40 MG/ML IJ SUSP
80 mg | Freq: Once | EPIDURAL | 0 refills | Status: CP
Start: 2019-01-17 — End: ?

## 2019-01-17 MED ORDER — PREGABALIN 75 MG PO CAP
75 mg | ORAL_CAPSULE | Freq: Every day | ORAL | 3 refills | Status: AC
Start: 2019-01-17 — End: ?

## 2019-01-17 MED ORDER — LIDOCAINE (PF) 10 MG/ML (1 %) IJ SOLN
2 mL | Freq: Once | INTRAMUSCULAR | 0 refills | Status: CP
Start: 2019-01-17 — End: ?

## 2019-01-17 NOTE — Telephone Encounter
Patient called to reschedule EGD. Voicemail left to provide phone number to GI scheduling.

## 2019-01-17 NOTE — Procedures
Attending Surgeon: Lizbeth Bark, MD    Anesthesia: Local    Pre-Procedure Diagnosis:   1. Lumbar stenosis with neurogenic claudication    2. Lumbar discogenic pain syndrome        Post-Procedure Diagnosis:   1. Lumbar stenosis with neurogenic claudication    2. Lumbar discogenic pain syndrome        [place] [facility] SPINE INJECT INTERLAM LMBR/SAC  Procedure: epidural - interlaminar    Laterality: n/a    Location: lumbar ESI with imaging - L5-S1      Consent:   Consent obtained: verbal and written  Consent given by: patient  Risks discussed: bleeding, bruising, infection, weakness and no change or worsening in pain    Discussed with patient the purpose of the treatment/procedure, other ways of treating my condition, including no treatment/ procedure and the risks and benefits of the alternatives. Patient has decided to proceed with treatment/procedure.        Universal Protocol:  Relevant documents: relevant documents present and verified  Test results: test results available and properly labeled  Imaging studies: imaging studies available  Required items: required blood products, implants, devices, and special equipment available  Site marked: the operative site was marked  Patient identity confirmed: Patient identify confirmed verbally with patient.        Time out: Immediately prior to procedure a time out was called to verify the correct patient, procedure, equipment, support staff and site/side marked as required      Procedures Details:   Indications: pain   Prep: chlorhexidine  Number of Levels: 1  Approach: midline  Guidance: fluoroscopy  Contrast: Procedure confirmed with contrast under live fluoroscopy.  Needle and Epidural Catheter: tuohy  Needle size: 18 G  Injection procedure: Negative aspiration for blood and Incremental injection  Patient tolerance: Patient tolerated the procedure well with no immediate complications. Pressure was applied, and hemostasis was accomplished. Comments: DESCRIPTION OF PROCEDURE:  The procedure risks and benefits were explained to the patient and informed consent was obtained.  The patient was positioned prone on the fluoroscopy table with a pillow under the abdomen to help reduce lumbar lordosis.  Blood pressure cuff and oxygen saturation monitors were attached and the patient was monitored throughout the entire procedure.  The L5 vertebral level was identified with the use of fluoroscopy in the AP view.  The skin was prepped using Chlorhexadine and draped in aseptic fashion.  The skin and subcutaneous tissue were anesthetized using 3 mL of 1 percent lidocaine with a 27-gauge needle.  A 3.5 inch 22-gauge Tuohy needle was slowly advanced using AP view towards the midline L5-S1 epidural space.  The latter part of the needle advancement was guided with fluoroscopy in the lateral view.  The epidural space was identified using loss of resistance technique.  After negative aspiration, 1 mL of Isovue contrast dye was injected.  After epidural spread was seen, a solution containing 80 mg of triamcinolone and 2 mL of normal saline was injected in increments.  The stylet was reinserted and the needle was then removed.     After the procedure, the patient's blood pressure, heart rate, oxygen saturation, and VAS were recorded in the chart. There were no complications.  The patient tolerated the procedure well and was brought to recovery room for observation in stable condition and discharged with written discharge instructions.     PLAN OF CARE:  The patient is to followup in 6 weeks.    The patient was advised to  contact the [facility] for any of the following    1. Fever, chills, or night sweats.  2. New onset of severe sharp pain.  3. Any new upper or lower extremity weakness or numbness.  4. Any questions regarding the procedure.        Estimated blood loss: none or minimal  Specimens: none Patient tolerated the procedure well with no immediate complications. Pressure was applied, and hemostasis was accomplished.

## 2019-01-17 NOTE — Progress Notes
Confirmed procedure date/time with patient, reviewed prep instructions with patient, verbalized good understanding, confirmed patient would have a driver and placed instructions in MyChart for patient.      Regarding COVID swab Screening, as we discussed, you were going to [facility] in [place], [place] today 01/17/19 to get tested for your procedure. I faxed the orders to [phone] . I included our fax number and contact info the fax cover sheet for this facility to fax results back to [place].    You are scheduled for an EGD on 01/20/19 with Dr. Deniece Ree must ARRIVE AND CHECK IN at Admissions by 08:30 am    Admissions:  [facility]  [address]  [place], [place]., [address]      Your procedure is scheduled at: 10:00 am      If you need to reschedule or if you have questions call [phone] Option #3.                                                                                EGD (ESOPHAGOGASTRODUODENOSCOPY) PREP      Upper GI endoscopy allows healthcare providers to look directly into the beginning of your gastrointestinal(GI) tract.  The esophagus, stomach, and duodenum (first part of the small intestine) make up the upper GI tract.      5 Days Prior:  1. Check with your prescribing physician for instructions about stopping your blood thinner.  Examples of blood thinners are Aleve, Aspirin. Coumadin, Eliquis, Ibuprofen, Naproxen, Plavix, and Xarelto.  2. Do not give yourself a Lovenox injection the morning of the test. Lovenox injections may be taken as usual through the day before your test.    Day of Exam:  1. Do not eat or drink anything after midnight the night before your exam. However,IF your exam is in the AFTERNOON  you may ONLY DRINK CLEAR LIQUIDS-SEE BELOW-only up until (4) hours before your scheduled procedure time.  After this, you should have nothing by mouth.  This includes GUM or CANDY. a. Chewing tobacco must be stopped  (6) hours before your scheduled procedure.     Clear Liquid Diet (avoid all items with RED, PURPLE, or ORANGE coloring)  Water    Apple or White Grape Juice           Coffee or tea without cream   Tea    White Cranberry Juice        Chicken Bouillon or Broth (no noodles)  Soda Pop(all pop Is OK)  Green or Yellow Popsicle    Beef Bouillon or Broth (no noodles)      b. If you have an early morning test, take ONLY your essential morning medications (heart, blood pressure, seizure, etc.) with a small sip of water.   c. You will be sedated for the procedure. A responsible adult must drive you home (no Benedetto Goad, taxis, or buses are permitted). If you do not have a driver we will be unable to do the test.   d. You will be here for (3-4) hours from arrival time.   e. You will not be able to return to work the same day.  f. Please bring a list of your current medications and the dosages with you.     The Procedure:  ? You will lie on the endoscopy table. Usually patients lie on the left side.  ? You will be monitored and given oxygen.   ? You are given sedation (relaxing) medication through an intravenous (IV) line.  ? The healthcare provider will put the endoscope in your mouth and down your esophagus. It is thinner than most pieces of food that you swallow.  It will not affect your breathing. The medicine helps keep you from gagging.   ? Air is inserted to expand your GI tract. It can make you burp.  ? During the procedure, the healthcare provider can take biopsies (tissue samples), remove abnormalities such as polyps, or treat abnormalities though a variety of devices placed through the endoscope. You will not feel this.   ? The endoscope carries images of your upper GI tract to a video screen.  ? An adult must drive you home.

## 2019-01-17 NOTE — Progress Notes
SPINE CENTER  INTERVENTIONAL PAIN PROCEDURE HISTORY AND PHYSICAL    No chief complaint on file.      HISTORY OF PRESENT ILLNESS:  Thomas Francis is a 48 y.o. year old male who presents for injection.  Denies fevers, chills, or recent hospitalizations.  No allergies to contrast, latex, seafood, iodine, or shellfish.  Patient denies blood thinning medications.        Medical History:   Diagnosis Date   ? Asthma    ? COPD (chronic obstructive pulmonary disease) (HCC)    ? DM (diabetes mellitus) (HCC)    ? Hypertension    ? Hypothyroid    ? Low testosterone    ? Morbid obesity with BMI of 50.0-59.9, adult (HCC) 06/17/2017   ? 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 Obesity    ? Obstructive sleep apnea    ? Sleep apnea        Surgical History:   Procedure Laterality Date   ? ABSCESS DRAINAGE Right 12/2016    [facility] right buttock cheek.   ? TRANSURETHRAL DRAINAGE ABSCESS - PROSTATE N/A 06/01/2017    Performed by Eveline Keto, MD at [facility] OR   ? ANGIOGRAPHY CORONARY ARTERY WITH LEFT HEART CATHETERIZATION N/A 07/14/2018    Performed by Myriam Jacobson, MD at [facility] CATH LAB   ? POSSIBLE PERCUTANEOUS CORONARY STENT PLACEMENT WITH ANGIOPLASTY N/A 07/14/2018    Performed by Myriam Jacobson, MD at [facility] CATH LAB   ? TONSILLECTOMY         family history includes Heart Disease in his father.    Social History     Socioeconomic History   ? Marital status: Single     Spouse name: Not on file   ? Number of children: Not on file   ? Years of education: Not on file   ? Highest education level: Not on file   Occupational History   ? Not on file   Tobacco Use   ? Smoking status: Former Smoker     Types: Cigarettes   ? Smokeless tobacco: Current User     Types: Chew   Substance and Sexual Activity   ? Alcohol use: Yes     Frequency: 2-4 times a month     Drinks per session: 7 to 9     Binge frequency: Monthly     Comment: social drinker   ? Drug use: Never   ? Sexual activity: Not on file   Other Topics Concern ? Not on file   Social History Narrative   ? Not on file       No Known Allergies    Vitals:    01/17/19 0741   Temp: 37.5 ?C (99.5 ?F)       REVIEW OF SYSTEMS: 10 point ROS obtained and negative except back pain      PHYSICAL EXAM:  General: 48 y.o. male appears stated age, in no acute distress  HEENT: Normocephalic, atraumatic  Neck: No thyroidmegaly  Cardiovascular: Well perfused  Pulmonary: Unlabored respirations  Extremities: No cyanosis, clubbing, or edema  Skin: No lesions seen on exposed skin  Psychiatric:  Appropriate mood and affect  Musculoskeletal: No atrophy.   Neurologic: Antigravity strength in all extremities. CN II -XII grossly intact.  Alert and oriented x 3.           IMPRESSION:    1. Lumbar stenosis with neurogenic claudication    2. Lumbar discogenic pain syndrome  PLAN:   Lumbar Epidural Steroid Injection L5-S1  Refill tizanidine.   Started Lyrica as adjunct to gabapentin 800 TID.

## 2019-01-17 NOTE — Discharge Instructions - Supplementary Instructions
..  GENERAL POST PROCEDURE INSTRUCTIONS    Time:____________________  Physician:_________________________________    Procedure Completed Today:  o Joint Injection (hip, knee, shoulder)  o Cervical Epidural Steroid Injection  o Cervical Transforaminal Steroid Injection  o Trigger Point Injection  o Other___________________________ o Thoracic Epidural Steroid Injection  o Lumbar Epidural Steroid Injection  o Lumbar Transforaminal Steroid Injection  o Facet Joint Injection     Important information following your procedure today:  ? You may drive today     ? If you had sedation ,you may NOT drive today  ? Rest at home for the next 6 hours.  You may then begin to resume your normal activities.  ? DO NOT drive any vehicle, operate any power tools, drink alcohol, make any major decisions, or sign any legal documents for the next 12 hours.  1. Pain relief may not be immediate. It is possible you may even experience an increase in pain during the first 24-48 hours followed by a gradual decrease of your pain.  2. Though the procedure is generally safe and complications are rare, we do ask that you be aware of any of the following:  ? Any swelling, persistent redness, new bleeding or drainage from the site of the injection.  ? You should not experience a severe headache.  ? You should not run a fever over 101oF.  ? New onset of sharp, severe back and or neck pain.  ? New onset of upper or lower extremity numbness or weakness.  ? New difficulty controlling bowel or bladder function after injection.  ? New shortness of breath.  If any of these occur, please call to report this occurrence to a nurse at [phone]. If you are calling after 4:00pm, on the weekends or holidays please call [phone] and ask to have the pain management resident physician on call for the physician paged or go to your local emergency room. 3. You may experience soreness at the injection site. Ice can be applied at 20 minute intervals for the first 24 hours. The following day you may alternate ice with heat if you are experiencing muscle tightness, otherwise continue with ice. Ice works best at decreasing pain. Avoid application of direct heat, hot showers or hot tubs today.  4. Avoid strenuous activity today. You many resume your regular activities and exercise tomorrow.  5. Patients with diabetes may see an elevation in blood sugars for 7-10 days after the injection. It is important to pay close attention to you diet, check your blood sugars daily and repost extreme elevations to the physician that treats your diabetes.  6. Patients taking daily blood thinners can resume their regular dose this evening.  7. It is important that you take all medications ordered by your pain physician. Taking medications as ordered is an important part of you pain care plan. If you cannot continue the medication plan, please notify the physician.  Possible side effects to steroids that may occur:  ? Flushing or redness of the face  ? Irritability  ? Fluid retention  ? Change in women's menses  Follow up appointment as needed if in the event you are unable to keep an appointment please notify the scheduler 24 hours in advance at [phone].    ____________________________________  ____________________________________

## 2019-01-18 ENCOUNTER — Encounter: Admit: 2019-01-18 | Discharge: 2019-02-07 | Payer: MEDICAID

## 2019-01-19 ENCOUNTER — Encounter: Admit: 2019-01-19 | Discharge: 2019-01-19 | Payer: MEDICAID

## 2019-01-19 DIAGNOSIS — Z20828 Contact with and (suspected) exposure to other viral communicable diseases: Secondary | ICD-10-CM

## 2019-01-20 ENCOUNTER — Encounter: Admit: 2019-01-20 | Discharge: 2019-01-20 | Payer: MEDICAID

## 2019-01-20 ENCOUNTER — Ambulatory Visit: Admit: 2019-01-20 | Discharge: 2019-01-20 | Payer: MEDICAID

## 2019-01-20 DIAGNOSIS — I10 Essential (primary) hypertension: Secondary | ICD-10-CM

## 2019-01-20 DIAGNOSIS — R7989 Other specified abnormal findings of blood chemistry: Secondary | ICD-10-CM

## 2019-01-20 DIAGNOSIS — G629 Polyneuropathy, unspecified: Secondary | ICD-10-CM

## 2019-01-20 DIAGNOSIS — E039 Hypothyroidism, unspecified: Secondary | ICD-10-CM

## 2019-01-20 DIAGNOSIS — E669 Obesity, unspecified: Secondary | ICD-10-CM

## 2019-01-20 DIAGNOSIS — G4730 Sleep apnea, unspecified: Secondary | ICD-10-CM

## 2019-01-20 DIAGNOSIS — J45909 Unspecified asthma, uncomplicated: Secondary | ICD-10-CM

## 2019-01-20 DIAGNOSIS — J449 Chronic obstructive pulmonary disease, unspecified: Secondary | ICD-10-CM

## 2019-01-20 DIAGNOSIS — G4733 Obstructive sleep apnea (adult) (pediatric): Secondary | ICD-10-CM

## 2019-01-20 DIAGNOSIS — E119 Type 2 diabetes mellitus without complications: Secondary | ICD-10-CM

## 2019-01-20 MED ORDER — LIDOCAINE (PF) 200 MG/10 ML (2 %) IJ SYRG
0 refills | Status: DC
Start: 2019-01-20 — End: 2019-01-20
  Administered 2019-01-20: 17:00:00 100 mg via INTRAVENOUS

## 2019-01-20 MED ORDER — PROPOFOL INJ 10 MG/ML IV VIAL
0 refills | Status: DC
Start: 2019-01-20 — End: 2019-01-20
  Administered 2019-01-20: 17:00:00 50 mg via INTRAVENOUS
  Administered 2019-01-20 (×2): 20 mg via INTRAVENOUS

## 2019-01-20 MED ORDER — LACTATED RINGERS IV SOLP
1000 mL | INTRAVENOUS | 0 refills | Status: DC
Start: 2019-01-20 — End: 2019-01-20
  Administered 2019-01-20: 16:00:00 1000 mL via INTRAVENOUS

## 2019-01-20 MED ORDER — PROPOFOL 10 MG/ML IV EMUL 20 ML (INFUSION)(AM)(OR)
INTRAVENOUS | 0 refills | Status: DC
Start: 2019-01-20 — End: 2019-01-20

## 2019-01-20 NOTE — Anesthesia Pre-Procedure Evaluation
Anesthesia Pre-Procedure Evaluation    Name: Thomas Francis      MRN: [id number]     DOB: November 13, 1970     Age: 48 y.o.     Sex: male   __________________________________________________________________________     Procedure Date:    Procedure: Procedure(s):  ESOPHAGOGASTRODUODENOSCOPY WITH SPECIMEN COLLECTION BY BRUSHING/ WASHING     Physical Assessment  Vital Signs (last filed in past 24 hours):         Patient History  Not on File     Current Medications    Medication Directions   albuterol 0.083% (PROVENTIL) 2.5 mg /3 mL (0.083 %) nebulizer solution Inhale 3 mL solution by nebulizer as directed every 6 hours as needed for Wheezing or Shortness of Breath.   albuterol sulfate (PROAIR HFA) 90 mcg/actuation HFA aerosol inhaler INHALE 2 PUFFS BY MOUTH FOUR TIMES DAILY AS NEEDED FOR SHORTNESS OF BREATH   aspirin EC 81 mg tablet Take 81 mg by mouth daily. Take with food.   carvediloL (COREG) 25 mg tablet Take one tablet by mouth twice daily. Take with food.   citalopram (CELEXA) 20 mg tablet Take 40 mg by mouth daily.   diclofenac (VOLTAREN) 1 % topical gel Apply four g topically to affected area four times daily.  Patient taking differently: Apply 4 g topically to affected area four times daily as needed.   divalproex (DEPAKOTE ER) 500 mg ER tablet Take 500 mg by mouth twice daily.   docusate (COLACE) 100 mg capsule Take 100 mg by mouth daily.   dulaglutide (TRULICITY) 1.5 mg/0.5 mL injection pen Inject 1.5 mg under the skin every 7 days.   famotidine (PEPCID) 20 mg tablet Take 40 mg by mouth daily as needed. Indications: heartburn   fluticasone propionate (FLONASE) 50 mcg/actuation nasal spray Apply 1 spray to each nostril as directed twice daily. Shake bottle gently before using.   fluticasone propionate (FLOVENT HFA) 110 mcg/actuation inhaler Inhale 2 puffs by mouth into the lungs twice daily.   fluticasone-salmeterol (ADVAIR DISKUS) 250-50 mcg inhalation disk Inhale one puff by mouth into the lungs twice daily. gabapentin (NEURONTIN) 800 mg tablet Take 1 tablet by mouth three times daily.   glucosamine(+) 500 mg tab Take one tablet by mouth daily.   guaiFENesin LA (MUCINEX) 600 mg tablet Take one tablet by mouth twice daily.   hydrOXYzine (ATARAX) 25 mg tablet TAKE ONE TABLET BY MOUTH FOUR TIMES DAILY AS NEEDED FOR itching/swelling/anxiety   insulin glargine (LANTUS SOLOSTAR) 100 unit/mL (3 mL) injection PEN Inject 60 Units under the skin at bedtime daily.   insulin NPH (NOVOLIN N) 100 unit/mL injection Inject 40 Units under the skin twice daily.   insulin regular (HUMULIN R) 100 unit/mL soln injection 18 u tid with meals and sliding scale.  Patient taking differently: Inject 24 Units under the skin three times daily.   ipratropium bromide (ATROVENT) 0.02 % nebulizer solution Inhale 1 vial by mouth into the lungs four times daily as needed for Shortness of Breath or Wheezing.   lactobacillus rhamnosus (GG) (CULTURELLE) 10 billion cell cap Take one capsule by mouth daily with breakfast.   levothyroxine (SYNTHROID) 50 mcg tablet Take 50 mcg by mouth daily 30 minutes before breakfast.   lisinopril (ZESTRIL) 5 mg tablet Take one tablet by mouth daily.   LORazepam (ATIVAN) 1 mg tablet Take 1 mg by mouth three times daily as needed for Nausea. Indications: anxious   melatonin 10 mg tab Take one tablet by mouth at bedtime as  needed.   metFORMIN-XR (GLUCOPHAGE XR) 500 mg extended release tablet Take 1,000 mg by mouth twice daily with meals.   methocarbamoL (ROBAXIN) 750 mg tablet Take 750 mg by mouth twice daily.   neomycin-polymyxin-dexameth (MAXITROL) 3.5mg /g-10000unit/g-0.1% ophth oint Apply to right eye as needed   neomycin/polymyxin/dexameth (MAXITROL) 3.5mg /mL-10000unit/mL-0.1% ophth susp Apply to right eye as needed   omega-3s-dha-epa-fish oil (FISH OIL) 120 mg-180 mg- 60 mg-1,200 mg cpDR Take 1 capsule by mouth daily. oxyCODONE-acetaminophen(+) (PERCOCET) 7.5-325 mg tablet Take 1-2 tablets by mouth every 6 hours as needed   pregabalin (LYRICA) 75 mg capsule Take one capsule by mouth daily.   sildenafil(+) (VIAGRA) 100 mg tablet Take one tablet by mouth as Needed for Erectile dysfunction. 30-60 min prior to sexual activity   simvastatin (ZOCOR) 20 mg tablet Take 20 mg by mouth at bedtime daily.   tamsulosin (FLOMAX) 0.4 mg capsule Take one capsule by mouth twice daily. Do not crush, chew or open capsules. Take 30 minutes following the same meal each day.   testosterone cypionate (DEPO-TESTOSTERONE) 200 mg/mL injection Inject 200 mg into the muscle every 14 days.   tiZANidine (ZANAFLEX) 4 mg tablet Take one tablet by mouth every 6 hours as needed.   vitamins, multiple tablet Take 1 tablet by mouth daily.   zolpidem (AMBIEN) 10 mg tablet Take 10 mg by mouth at bedtime as needed for Sleep.     Medical History:   Diagnosis Date   ? Asthma    ? COPD (chronic obstructive pulmonary disease) (HCC)    ? DM (diabetes mellitus) (HCC)    ? Hypertension    ? Hypothyroid    ? Low testosterone    ? Morbid obesity with BMI of 50.0-59.9, adult (HCC) 06/17/2017   ? 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 Obesity    ? Obstructive sleep apnea    ? Sleep apnea      Surgical History:   Procedure Laterality Date   ? ABSCESS DRAINAGE Right 12/2016    [facility] right buttock cheek.   ? TRANSURETHRAL DRAINAGE ABSCESS - PROSTATE N/A 06/01/2017    Performed by Eveline Keto, MD at [facility] OR   ? ANGIOGRAPHY CORONARY ARTERY WITH LEFT HEART CATHETERIZATION N/A 07/14/2018    Performed by Myriam Jacobson, MD at [facility] CATH LAB   ? POSSIBLE PERCUTANEOUS CORONARY STENT PLACEMENT WITH ANGIOPLASTY N/A 07/14/2018    Performed by Myriam Jacobson, MD at [facility] CATH LAB   ? TONSILLECTOMY       Social History     Tobacco Use   ? Smoking status: Former Smoker     Types: Cigarettes   ? Smokeless tobacco: Current User     Types: Chew   Substance Use Topics ? Alcohol use: Yes     Frequency: 2-4 times a month     Drinks per session: 7 to 9     Binge frequency: Monthly     Comment: social drinker   ? Drug use: Never         Review of Systems/Medical History      Patient summary reviewed  Nursing notes reviewed  Pertinent labs reviewed    PONV Screening: Non-smoker  No history of anesthetic complications (pt states she does not want ketamine. He did not like how it made him feel)  No family history of anesthetic complications      Airway - negative        No TMJ  ETT 06/01/17; 1055; Ventilated by mask (1); Video laryngoscopy; Single-Lumen; 7.27mm; GlideScope; 4; Oral; 1-Full view of the glottis; 1 insertion attempt; Auscultation, ETCO2 Detector; 25 centimeters; 06/01/17; 1302       Pulmonary           Asthma ( daily advair, flovent BID, albuterol 2-3 times a day)    COPD (daily advair, flovent BID, albuterol 2-3 times a day)      No indications/hx of pneumonia      No recent URI        Sleep apnea          Interventions: CPAP; compliant      PFTs 09/2018  FVC 80%  FEV1 77%  FEV1/FVC 77%      Cardiovascular       Recent diagnostic studies:          echocardiogram and stress test          Echocardiogram 06/2018  Interpretation Summary  1.Normal LV size, wall motion with EF 55%.   2.Normal LV diastolic function  3.Normal RV size and function.   4.Suboptimal visualization of the valve, however no significant valvular abnormality on color Doppler.  5.Normal CVP & PA systolic pressure 33 mmHg.      Stress 06/2018 SUMMARY/OPINION:??This study is technically difficult given patient's extreme body habitus.  However it does appear abnormal. There is a small sized mild intensity reversible perfusion abnormality seen in the inferior and inferolateral segments from the base to the mid ventricular cavity.  This is consistent with ischemia in the right coronary artery distribution.  The global left ventricular systolic function is mild to moderately depressed with an LVEF of 42%. There are no high risk prognostic indicators present.  The pharmacologic ECG portion of the study is negative for ischemia.  Comparison is made with a prior thallium myocardial perfusion imaging study completed 06/19/2017.  Ejection fraction was 66 %.  The study was normal and did not reveal any perfusion abnormalities.  The small inferior and inferolateral reversible perfusion defect is new and there is been a decrease in the calculated LVEF.  This study is intermediate risk predominantly due to reduced left ventricular systolic function with an LVEF of 42%.  The amount of inducible ischemia appears to be small.  In aggregate the current study is intermediate risk in regards to predicted annual cardiovascular mortality rate.       Exercise tolerance: <4 METS (pt unable to walk 4 blocks without SOB and some chest tightness, related to weight and lung disease)       Beta Blocker therapy: Yes        Hypertension, well controlled      No valvular problems/murmurs      No palpitations      Dysrhythmias ( 15 beat run of [facility] 06/02/17, asymptomatic; RBBB)        Angina ( stable chest tightness with exertion, evaluated by cardiology) with exertion      Hyperlipidemia ( on statin)      No orthopnea      Dyspnea on exertion      No syncope      Daily ASA    Followed by cardiology, last OV 08/11/18, recommended follow up in 6 months Per Roberts Gaudy, PA-C (CVM)- We discussed bariatric surgery in the near future to assist with his weight loss management and should be cleared from a CV standpoint given his normal coronary angiography and normal echo Doppler.    Cath 06/2018  IMPRESSION:  Normal coronary arteries with no evidence of any significant atherosclerosis.      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       Constitution - negative   Physical Exam    Airway Findings      Mallampati: II      TM distance: <3 FB      Neck ROM: full      Mouth opening: good    Dental Findings: Negative            Cardiovascular Findings:       Rhythm: regular      Rate: normal      No murmur, no carotid bruit, no peripheral edema    Pulmonary Findings:       Breath sounds clear to auscultation.    Abdominal Findings:       Obese    Neurological Findings:       Alert and oriented x 3    Normal mental status    Constitutional findings: Negative      No acute distress       Diagnostic Tests  Hematology:   Lab Results   Component Value Date    HGB 12.1 07/12/2018    HCT 36.4 07/12/2018    PLTCT 255 07/12/2018    WBC 7.3 07/12/2018    NEUT 50 06/29/2017    ANC 2.90 06/29/2017    ALC 2.00 06/29/2017 MONA 12 06/29/2017    AMC 0.70 06/29/2017    EOSA 2 06/29/2017    ABC 0.10 06/29/2017    MCV 87.2 07/12/2018    MCH 29.0 07/12/2018    MCHC 33.3 07/12/2018    MPV 9.3 07/12/2018    RDW 13.6 07/12/2018         General Chemistry:   Lab Results   Component Value Date    NA 136 01/09/2019    K 4.0 01/09/2019    CL 101 01/09/2019    CO2 23 01/09/2019    GAP 12 01/09/2019    BUN 15 01/09/2019    CR 0.91 01/09/2019  GLU 160 01/09/2019    CA 9.6 01/09/2019    ALBUMIN 4.0 01/09/2019    LACTIC 1.5 05/28/2017    MG 1.7 07/12/2018    TOTBILI 0.4 01/09/2019    PO4 5.3 06/24/2017      Coagulation:   Lab Results   Component Value Date    PTT 27.6 06/17/2017    INR 1.1 06/17/2017         Anesthesia Plan    ASA score: 3   Plan: MAC  Induction method: intravenous  NPO status: acceptable      Informed Consent  Anesthetic plan and risks discussed with patient.  Use of blood products discussed with patient      Plan discussed with: anesthesiologist and CRNA.

## 2019-01-20 NOTE — Anesthesia Post-Procedure Evaluation
Post-Anesthesia Evaluation    Name: Thomas Francis      MRN: [id number]     DOB: 23-Aug-1970     Age: 48 y.o.     Sex: male   __________________________________________________________________________     Procedure Information     Anesthesia Start Date/Time: 01/20/19 1028    Procedures:       ESOPHAGOGASTRODUODENOSCOPY WITH SPECIMEN COLLECTION BY BRUSHING/ WASHING (N/A )      ESOPHAGOGASTRODUODENOSCOPY WITH BIOPSY - FLEXIBLE (N/A )    Location: ENDO 4 / ENDO/GI    Surgeon: Roslynn Amble, MD          Post-Anesthesia Vitals  BP: 129/72 (12/04 1105)  Temp: 36.7 C (98.1 F) (12/04 1042)  Pulse: 88 (12/04 1105)  Respirations: 17 PER MINUTE (12/04 1105)  SpO2: 93 % (12/04 1105)  SpO2 Pulse: 88 (12/04 1105)   Vitals Value Taken Time   BP 129/72 01/20/19 1105   Temp 36.7 C (98.1 F) 01/20/19 1042   Pulse 88 01/20/19 1105   Respirations 17 PER MINUTE 01/20/19 1105   SpO2 93 % 01/20/19 1105         Post Anesthesia Evaluation Note    Evaluation location: Pre/Post  Patient participation: recovered; patient participated in evaluation  Level of consciousness: alert  Pain management: adequate    Hydration: normovolemia  Airway patency: adequate    Perioperative Events       Post-op nausea and vomiting: no PONV    Postoperative Status  Cardiovascular status: hemodynamically stable  Respiratory status: spontaneous ventilation  Follow-up needed: none        Perioperative Events  Perioperative Event: No  Emergency Case Activation: No

## 2019-01-21 ENCOUNTER — Encounter: Admit: 2019-01-21 | Discharge: 2019-01-21 | Payer: MEDICAID

## 2019-01-21 DIAGNOSIS — G4730 Sleep apnea, unspecified: Secondary | ICD-10-CM

## 2019-01-21 DIAGNOSIS — J45909 Unspecified asthma, uncomplicated: Secondary | ICD-10-CM

## 2019-01-21 DIAGNOSIS — R7989 Other specified abnormal findings of blood chemistry: Secondary | ICD-10-CM

## 2019-01-21 DIAGNOSIS — E669 Obesity, unspecified: Secondary | ICD-10-CM

## 2019-01-21 DIAGNOSIS — E039 Hypothyroidism, unspecified: Secondary | ICD-10-CM

## 2019-01-21 DIAGNOSIS — E119 Type 2 diabetes mellitus without complications: Secondary | ICD-10-CM

## 2019-01-21 DIAGNOSIS — G629 Polyneuropathy, unspecified: Secondary | ICD-10-CM

## 2019-01-21 DIAGNOSIS — J449 Chronic obstructive pulmonary disease, unspecified: Secondary | ICD-10-CM

## 2019-01-21 DIAGNOSIS — G4733 Obstructive sleep apnea (adult) (pediatric): Secondary | ICD-10-CM

## 2019-01-21 DIAGNOSIS — I10 Essential (primary) hypertension: Secondary | ICD-10-CM

## 2019-01-30 ENCOUNTER — Encounter: Admit: 2019-01-30 | Discharge: 2019-01-30 | Payer: MEDICAID

## 2019-01-30 NOTE — Progress Notes
Obtained patient's verbal consent to treat them and their agreement to [facility] financial policy and NPP via this telehealth visit during the [facility] Emergency      Subjective:       History of Present Illness  Thomas Francis is a 48 y.o. male.type 2 diabetes (uncontrolled), hypothyroid,hypertension, morbid obesity, COPD, OSA. chronic pain, and severe depression who was seen today as a follow up for type 2 diabetes.  He was last seen on 09/09/18.  Interval changes:   Getting prep for bariatric surgery (tentative plan for duodenal switch).  He recently had an EGD on 01/20/19 which showed a large amount of food residual and will get gastric emptying study scheduled.  He had sleep study and now on CPAP.  He has had difficulty affording insulin as he required 5,000 $ spend down every 6 months for Medicaid to cover his medication and so has been out of insulin due to cost until 1 week ago. He also had some steroid shot in his back last week which contributed to higher blood sugar readings.  He was admitted for cardiac cath on 07/14/2018 which revealed no significant coronary disease.  An echocardiogram revealed normal LV function, no significant valve disease  He continues to note fatigue, chronic severe back pain, extreme fatigue, depressed mood. He continues on testosterone therapy by Dr. Suan Halter, 200 mg q 2 weeks.  This has helped with his mood but not erectile dysfunction.  He also has significant problem with urinary retention, increased frequency and incontinence, he would like urology referral in [place].    Type 2  Diabetes Mellitus with complications, diagnosed since 2010  Most recent  reported HbA1C around 10.2 early July 2020 and end of October 2020, previously HgbA1C 8.2 on 12/10/17,  Difficulty affording Trulicity, Humalog or Lantus.  He was buying Reg and NPH over the counter and taking that but still ran out due to inadequate income. He is back to lantus 60 units at bedtime and lispro 24 units of regular TID.     PTA regimen:-   Lantus  60 units daily and lispro insulin  24 units TID, metformin XR 1000 mg twice daily     Previous therapy; NPH and regular insulin, regular metformin caused GI issues, Trulicity weekly 1.5 mg or victoza or ozempic depends on what he was given as sample, he did not have much side effects with Trulicity however causes sulfur breath  Frequency of blood sugar check 2-3 times daily reported fasting blood sugar in the 300 range.  Hypoglycemic episodes on this regimen: none  Diabetic-complications assessment:               Retinopathy: due for exam                Peripheral neuropathy: yes               Autonomic neuropathy: no               Nephropathy: unknown, no recent MACR               Macrovascular complications: no               Hx of DKA - no  On ACEi/ARB?:  Lisinopril 5 mg daily  On Statin?: lipitor 10 mg daily  Exercise - limited due to back pain.   Weight changes weight gain due to poor diet. He gained 20 lbs from last year, weight stable from last visit and he will  be getting bariatric surgery.  Hypothyroid:  Lt 4 50 mcg daily, most recent TSH of 1.02 06/14/18     SH: on disability with nursing background, no smoking or excessive alcohol  Family history: of multiple family members with type 2 diabetes and morbid obesity.  Father had bypass surgery at age 56  PMH: COPD, hypertension, OSA, chronic lower back pain, hypogonadism  Review of Systems  Constitutional:   Fatigue: yes  Pain: yes  Skin:   Heat or cold intolerance : no  Dry skin : no  Flushing episodes : no  Hair loss : no  Excessive body or facial hair: no  Head   Headaches: no  Double or blurry vision : no  Difficulty swallowing or choking : no  Feeling of food stuck: no  Neck enlargement or lumps : no  Head or chest radiation exposure : no  Decreased hearing : no  Chest/Heart   Nipple discharge : no  Chest pain or pressure: no  Heartburn: no Shortness of breath: no  Heart racing or pounding :yes  Digestion   Abdominal or belly pain : no  Constipation : no  Diarrhea : no  Nausea and vomiting: no  Reflux : no  Lactose intollerance : no  Reproduction   Difficulty with erections: yes  Decreased interest in sex:yes  Urinary   Kidney stones: no   Night time urination : yes  Skeleton   Muscle cramping: yes  Glands   Weight loss : no  Weight gain : no  Nerves   Nervous or anxious : yes  Seizures or falls : no  Dizziness : no  numbenss or tingling :yes  Mental Health   Do you feel sad? :yes  Sleep problems : yes        Objective:         ? albuterol 0.083% (PROVENTIL) 2.5 mg /3 mL (0.083 %) nebulizer solution Inhale 3 mL solution by nebulizer as directed every 6 hours as needed for Wheezing or Shortness of Breath.   ? albuterol sulfate (PROAIR HFA) 90 mcg/actuation HFA aerosol inhaler INHALE 2 PUFFS BY MOUTH FOUR TIMES DAILY AS NEEDED FOR SHORTNESS OF BREATH   ? aspirin EC 81 mg tablet Take 81 mg by mouth daily. Take with food.   ? carvediloL (COREG) 25 mg tablet Take one tablet by mouth twice daily. Take with food.   ? citalopram (CELEXA) 20 mg tablet Take 40 mg by mouth daily.   ? diclofenac (VOLTAREN) 1 % topical gel Apply four g topically to affected area four times daily. (Patient taking differently: Apply 4 g topically to affected area four times daily as needed.)   ? divalproex (DEPAKOTE ER) 500 mg ER tablet Take 500 mg by mouth twice daily.   ? docusate (COLACE) 100 mg capsule Take 100 mg by mouth daily.   ? dulaglutide (TRULICITY) 1.5 mg/0.5 mL injection pen Inject 1.5 mg under the skin every 7 days.   ? famotidine (PEPCID) 20 mg tablet Take 40 mg by mouth daily as needed. Indications: heartburn   ? fluticasone propionate (FLONASE) 50 mcg/actuation nasal spray Apply 1 spray to each nostril as directed twice daily. Shake bottle gently before using. ? fluticasone propionate (FLOVENT HFA) 110 mcg/actuation inhaler Inhale 2 puffs by mouth into the lungs twice daily.   ? fluticasone-salmeterol (ADVAIR DISKUS) 250-50 mcg inhalation disk Inhale one puff by mouth into the lungs twice daily.   ? gabapentin (NEURONTIN) 800 mg tablet Take 1 tablet by  mouth three times daily.   ? glucosamine(+) 500 mg tab Take one tablet by mouth daily.   ? guaiFENesin LA (MUCINEX) 600 mg tablet Take one tablet by mouth twice daily.   ? hydrOXYzine (ATARAX) 25 mg tablet TAKE ONE TABLET BY MOUTH FOUR TIMES DAILY AS NEEDED FOR itching/swelling/anxiety   ? insulin glargine (LANTUS SOLOSTAR) 100 unit/mL (3 mL) injection PEN Inject 60 Units under the skin at bedtime daily.   ? insulin NPH (NOVOLIN N) 100 unit/mL injection Inject 40 Units under the skin twice daily.   ? insulin regular (HUMULIN R) 100 unit/mL soln injection 18 u tid with meals and sliding scale. (Patient taking differently: Inject 24 Units under the skin three times daily.)   ? ipratropium bromide (ATROVENT) 0.02 % nebulizer solution Inhale 1 vial by mouth into the lungs four times daily as needed for Shortness of Breath or Wheezing.   ? lactobacillus rhamnosus (GG) (CULTURELLE) 10 billion cell cap Take one capsule by mouth daily with breakfast.   ? levothyroxine (SYNTHROID) 50 mcg tablet Take 50 mcg by mouth daily 30 minutes before breakfast.   ? lisinopril (ZESTRIL) 5 mg tablet Take one tablet by mouth daily.   ? LORazepam (ATIVAN) 1 mg tablet Take 1 mg by mouth three times daily as needed for Nausea. Indications: anxious   ? melatonin 10 mg tab Take one tablet by mouth at bedtime as needed.   ? metFORMIN-XR (GLUCOPHAGE XR) 500 mg extended release tablet Take 1,000 mg by mouth twice daily with meals.   ? methocarbamoL (ROBAXIN) 750 mg tablet Take 750 mg by mouth twice daily.   ? neomycin-polymyxin-dexameth (MAXITROL) 3.5mg /g-10000unit/g-0.1% ophth oint Apply to right eye as needed ? neomycin/polymyxin/dexameth (MAXITROL) 3.5mg /mL-10000unit/mL-0.1% ophth susp Apply to right eye as needed   ? omega-3s-dha-epa-fish oil (FISH OIL) 120 mg-180 mg- 60 mg-1,200 mg cpDR Take 1 capsule by mouth daily.   ? oxyCODONE-acetaminophen(+) (PERCOCET) 7.5-325 mg tablet Take 1-2 tablets by mouth every 6 hours as needed   ? pregabalin (LYRICA) 75 mg capsule Take one capsule by mouth daily.   ? sildenafil(+) (VIAGRA) 100 mg tablet Take one tablet by mouth as Needed for Erectile dysfunction. 30-60 min prior to sexual activity   ? simvastatin (ZOCOR) 20 mg tablet Take 20 mg by mouth at bedtime daily.   ? tamsulosin (FLOMAX) 0.4 mg capsule Take one capsule by mouth twice daily. Do not crush, chew or open capsules. Take 30 minutes following the same meal each day.   ? testosterone cypionate (DEPO-TESTOSTERONE) 200 mg/mL injection Inject 200 mg into the muscle every 14 days.   ? tiZANidine (ZANAFLEX) 4 mg tablet Take one tablet by mouth every 6 hours as needed.   ? vitamins, multiple tablet Take 1 tablet by mouth daily.   ? zolpidem (AMBIEN) 10 mg tablet Take 10 mg by mouth at bedtime as needed for Sleep.     There were no vitals filed for this visit.  There is no height or weight on file to calculate BMI.     Physical Exam  General Appearance:morbidly obesed, no distress   Respiratory Effort: breathing comfortably, no respiratory distress   Gait & Station: walks without assistance Orientation: oriented to time, place and person   Affect & Mood: appropriate and sustained affect   Language and Memory: patient responsive and seems to comprehend information   Neurologic Exam: neurological assessment grossly intact   Other: moves all extremities           Results for  Thomas Francis, Thomas Francis (MRN [id number]) as of 02/01/2019 17:12   Ref. Range 01/09/2019 12:42   Sodium Latest Ref Range: 137 - 147 MMOL/L 136 (L)   Potassium Latest Ref Range: 3.5 - 5.1 MMOL/L 4.0   Chloride Latest Ref Range: 98 - 110 MMOL/L 101 CO2 Latest Ref Range: 21 - 30 MMOL/L 23   Anion Gap Latest Ref Range: 3 - 12  12   Blood Urea Nitrogen Latest Ref Range: 7 - 25 MG/DL 15   Creatinine Latest Ref Range: 0.4 - 1.24 MG/DL 1.19   eGFR Non African American Latest Ref Range: >60 mL/min >60   eGFR African American Latest Ref Range: >60 mL/min >60   Glucose Latest Ref Range: 70 - 100 MG/DL 147 (H)   Albumin Latest Ref Range: 3.5 - 5.0 G/DL 4.0   Calcium Latest Ref Range: 8.5 - 10.6 MG/DL 9.6   Total Bilirubin Latest Ref Range: 0.3 - 1.2 MG/DL 0.4   Total Protein Latest Ref Range: 6.0 - 8.0 G/DL 7.4   AST (SGOT) Latest Ref Range: 7 - 40 U/L 17   ALT (SGPT) Latest Ref Range: 7 - 56 U/L 12   Alk Phosphatase Latest Ref Range: 25 - 110 U/L 47       Assessment and Plan:  1.Type 2  Diabetes Mellitus with complications, uncontrolled   HgbA1C  reported HbA1C around 10.2 early July 2020 and October 2020, previously HgbA1C 8.2 on 12/10/17, target A1c less than 7.   he has not been taking insulin regularly due to cost.  He is hoping to get bariatric surgery but will need substantially improvement of A1c prior to surgery.  Continue metformin XR 1000 mg twice daily  Continue Lantus 60 units daily by 2 units twice a week to keep FBS less than 140 without low. His recent FBS was good when he was NPO.  Continue lispro insulin 24 units with meals 3 times daily  Okay to hold off GLP-1 due to possible gastroparesis based on EGD.  Had diabetes education during most recent admission in spring 2019.  When he started clear liquid diet, he will reduce Lantus to 42 units at bedtime daily as well as lispro to 12 units with meals 3 times daily.  Also add on correction factor II units for every 50 points above 150.  2  Diet and life style modifications  3. Hypertension  goal less than 130/80.    4. Lipid  Lipitor 10 mg daily.  5.Diabetes microvascular complications: history of retinopathy and due for an eye exam.  No records of microalbumin over creatinine ratio. 6.Hypothyroid: Lt 4 50 mcg daily, most recent TSH of  1.02 on 06/14/18  7.  Erectile dysfunction this is multifactorial including chronic pain, uncontrolled diabetes, hypertension, depression, pain medication.  He was prescribed Viagra which has not helped.  Testosterone therapy is not a primary treatment for erectile dysfunction as well but addressing factors and weight loss could help.  Referral to urology in [place] as patient's request.  8.  Hypogonadism most likely related to multiple factors mentioned above.  He is was started on testosterone therapy and will get labs through Dr. Andreas Newport.  Return to clinic in 3 months.  Sooner once date of bariatric surgery is set    Total time reviewing chart and visit 30 minutes.  Estimated counseling time 20 minutes.

## 2019-01-31 ENCOUNTER — Encounter: Admit: 2019-01-31 | Discharge: 2019-01-31 | Payer: MEDICAID

## 2019-02-01 ENCOUNTER — Encounter: Admit: 2019-02-01 | Discharge: 2019-02-01 | Payer: MEDICAID

## 2019-02-01 ENCOUNTER — Ambulatory Visit: Admit: 2019-02-01 | Discharge: 2019-02-02 | Payer: MEDICAID

## 2019-02-01 DIAGNOSIS — I10 Essential (primary) hypertension: Secondary | ICD-10-CM

## 2019-02-01 DIAGNOSIS — R350 Frequency of micturition: Secondary | ICD-10-CM

## 2019-02-01 DIAGNOSIS — E785 Hyperlipidemia, unspecified: Secondary | ICD-10-CM

## 2019-02-01 MED ORDER — INSULIN LISPRO 100 UNIT/ML SC SOLN
24 [IU] | Freq: Three times a day (TID) | SUBCUTANEOUS | 11 refills | 30.00000 days | Status: AC
Start: 2019-02-01 — End: ?

## 2019-02-01 NOTE — Telephone Encounter
Pt called clinic back to see if earlier appt time would work.  Earlier appt time was on 12/22 @ 8 am with Dr. Shawn Stall.  Pt declined this earlier appt time due to a scheduling conflict.

## 2019-02-01 NOTE — Telephone Encounter
Patient answered phone and said he was ready to start his visit.  He then said that he was in the bathroom and needed about 5 minutes, "if you can call me back in 5 minutes, that would be helpful"..  Left message that this is an attempt to start the visit for Dr Maretta Los that is due to start at 4:00.  Will call back.  Left message that we will call again at 4:00 and that would be final call.  Went straight to [employer].  Left message on how to schedule a FU appointment with Dr Raliegh Ip.

## 2019-02-02 DIAGNOSIS — E1165 Type 2 diabetes mellitus with hyperglycemia: Secondary | ICD-10-CM

## 2019-02-06 ENCOUNTER — Encounter: Admit: 2019-02-06 | Discharge: 2019-02-06 | Payer: MEDICAID

## 2019-02-06 DIAGNOSIS — E1143 Type 2 diabetes mellitus with diabetic autonomic (poly)neuropathy: Secondary | ICD-10-CM

## 2019-02-07 DIAGNOSIS — Z20828 Contact with and (suspected) exposure to other viral communicable diseases: Secondary | ICD-10-CM

## 2019-02-13 ENCOUNTER — Encounter: Admit: 2019-02-13 | Discharge: 2019-02-13 | Payer: MEDICAID

## 2019-02-13 NOTE — Telephone Encounter
Attempted additional contact after roomer was not able to contact the patient for his telehealth appointment scheduled for 02/13/2019 at 13:00. Call was unable to go through. Voicemail not left.

## 2019-02-24 ENCOUNTER — Encounter: Admit: 2019-02-24 | Discharge: 2019-02-24 | Payer: MEDICAID

## 2019-02-24 ENCOUNTER — Ambulatory Visit: Admit: 2019-02-24 | Discharge: 2019-02-24 | Payer: MEDICAID

## 2019-02-24 DIAGNOSIS — E1143 Type 2 diabetes mellitus with diabetic autonomic (poly)neuropathy: Secondary | ICD-10-CM

## 2019-02-24 MED ORDER — RP DX TC-99M SULF COLL MCI
1 | Freq: Once | INTRAVENOUS | 0 refills | Status: CP
Start: 2019-02-24 — End: ?
  Administered 2019-02-24: 17:00:00 1 via INTRAVENOUS

## 2019-02-24 NOTE — Telephone Encounter
Spoke with patient via telephone with follow up. He is completing his gastric emptying study today but as of now the results will not be back in time for end of day. Patient requested to speak with Dr. Halina Andreas which was completed. Plan is for Dr. Halina Andreas to call him Monday to discuss the results from these tests today.

## 2019-02-27 ENCOUNTER — Encounter: Admit: 2019-02-27 | Discharge: 2019-02-27 | Payer: MEDICAID

## 2019-03-03 ENCOUNTER — Encounter: Admit: 2019-03-03 | Discharge: 2019-03-03 | Payer: MEDICAID

## 2019-03-03 ENCOUNTER — Ambulatory Visit: Admit: 2019-03-03 | Discharge: 2019-03-04 | Payer: MEDICAID

## 2019-03-03 DIAGNOSIS — F4310 Post-traumatic stress disorder, unspecified: Secondary | ICD-10-CM

## 2019-03-03 DIAGNOSIS — F509 Eating disorder, unspecified: Secondary | ICD-10-CM

## 2019-03-03 DIAGNOSIS — F603 Borderline personality disorder: Secondary | ICD-10-CM

## 2019-03-03 DIAGNOSIS — F332 Major depressive disorder, recurrent severe without psychotic features: Secondary | ICD-10-CM
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
## 2019-03-03 NOTE — Progress Notes
Patient gave 2 identifiers and consented to telehealth appointment. Audio and video available.

## 2019-03-04 DIAGNOSIS — E1100 Type 2 diabetes mellitus with hyperosmolarity without nonketotic hyperglycemic-hyperosmolar coma (NKHHC): Secondary | ICD-10-CM

## 2019-03-04 DIAGNOSIS — G4733 Obstructive sleep apnea (adult) (pediatric): Secondary | ICD-10-CM

## 2019-03-04 DIAGNOSIS — G8929 Other chronic pain: Secondary | ICD-10-CM

## 2019-03-04 DIAGNOSIS — Z6841 Body Mass Index (BMI) 40.0 and over, adult: Secondary | ICD-10-CM

## 2019-03-04 DIAGNOSIS — I10 Essential (primary) hypertension: Secondary | ICD-10-CM

## 2019-03-04 DIAGNOSIS — M545 Low back pain: Secondary | ICD-10-CM

## 2019-03-04 NOTE — Patient Instructions
Plan/Recommendations:    1. Encourage patient and caregivers to review educational materials provided by [employer].    [email or web address]/treatments/bariatric-weight-loss-surgery/resources/online-seminar-[email or web address]-surgery    2. Referral to Donnie Mesa, Bariatric Surgery Nurse Coordinator at [facility] to learn when the in-person weight-loss surgery seminar is held, as well as the pre- and post-surgery support group.     Website: [email or web address]    3. Referral to Dr. Jenean Lindau at [facility] psychology services for psychotherapy related to pre- and post-operative bariatric care.     4. Referral to [facility] psychiatry for psychotropic medication management.    5. Recommend that patient return for a psychodiagnostic re-evaluation in six months before being scheduled for surgery.    6. Return completed questionnaire packet via mail or MyChart message to Dr. Wyline Beady or Jacqulyn Bath.

## 2019-03-07 ENCOUNTER — Encounter: Admit: 2019-03-07 | Discharge: 2019-03-07 | Payer: MEDICAID

## 2019-03-07 NOTE — Telephone Encounter
Patient called to inquire about possible upcoming surgery dates. Informed him that we are most likely looking at a March timeframe for surgery scheduling. I will contact patient once schedule is available. Patient in agreement with plan and all questions answered.

## 2019-03-08 ENCOUNTER — Encounter

## 2019-03-08 DIAGNOSIS — R7989 Other specified abnormal findings of blood chemistry: Secondary | ICD-10-CM

## 2019-03-08 DIAGNOSIS — I10 Essential (primary) hypertension: Secondary | ICD-10-CM

## 2019-03-08 DIAGNOSIS — J449 Chronic obstructive pulmonary disease, unspecified: Secondary | ICD-10-CM

## 2019-03-08 DIAGNOSIS — G629 Polyneuropathy, unspecified: Secondary | ICD-10-CM

## 2019-03-08 DIAGNOSIS — G4730 Sleep apnea, unspecified: Secondary | ICD-10-CM

## 2019-03-08 DIAGNOSIS — E119 Type 2 diabetes mellitus without complications: Secondary | ICD-10-CM

## 2019-03-08 DIAGNOSIS — J45909 Unspecified asthma, uncomplicated: Secondary | ICD-10-CM

## 2019-03-08 DIAGNOSIS — F332 Major depressive disorder, recurrent severe without psychotic features: Secondary | ICD-10-CM

## 2019-03-08 DIAGNOSIS — F439 Reaction to severe stress, unspecified: Secondary | ICD-10-CM

## 2019-03-08 DIAGNOSIS — E669 Obesity, unspecified: Secondary | ICD-10-CM

## 2019-03-08 DIAGNOSIS — E039 Hypothyroidism, unspecified: Secondary | ICD-10-CM

## 2019-03-08 DIAGNOSIS — G4733 Obstructive sleep apnea (adult) (pediatric): Secondary | ICD-10-CM

## 2019-03-08 MED ORDER — CITALOPRAM 40 MG PO TAB
40 mg | ORAL_TABLET | Freq: Every day | ORAL | 1 refills | Status: DC
Start: 2019-03-08 — End: 2019-04-26

## 2019-03-08 MED ORDER — ZOLPIDEM 5 MG PO TAB
5 mg | ORAL_TABLET | Freq: Every evening | ORAL | 0 refills | 30.00000 days | Status: DC | PRN
Start: 2019-03-08 — End: 2019-04-26

## 2019-03-08 MED ORDER — DIVALPROEX 500 MG PO TB24
500 mg | ORAL_TABLET | Freq: Every evening | ORAL | 1 refills | 30.00000 days | Status: DC
Start: 2019-03-08 — End: 2019-04-26

## 2019-03-08 NOTE — Progress Notes
ATTENDING NOTE      Encounter Date: 03/08/2019    I saw and evaluated Thomas Francis, discussed with Thomas Mark, MD and concur with the assessment and treatment plan.     PRESENTING PROBLEM AND BACKGROUND: Patient is 49 y.o. male with severe obesity being considered for bariatric surgery referred for psychiatric evaluation.  He has had depression for many years which has been treated by his PCP.  He has had many stressors and past traumas which have contributed to his symptoms.  Has had inpatient admission times 2 in 1995, none since.  Denies SI/HI when asked.  No psychosis.      CURRENT TREATMENT AND RESPONSES: Celexa 40mg  daily, Depakote ER 500mg  bid--has been out of both for about 1 week due to insurance factors.  Ativan about 1-2 times per week for anxiety. Ambien but has been out.    PLAN:  1. Received text during the visit that his medications were ready to pick up, so he should be able to get them and take them daily.  Discussed wanting to see him on his medications before we make changes.  He denies ever being on any other medications.  2. Ambien 5mg  qhs for sleep.  3. LFTs normal 01/09/19, platelets normal 07/12/18.      May, MD

## 2019-03-08 NOTE — Progress Notes
Two patient identifiers verified for HIPAA compliance,patient verbally consented to telehealth  visit. Vitals provided by patient.

## 2019-03-08 NOTE — Patient Instructions
Continue taking Citalopram 40 mg daily for mood and anxiety  Continue taking Depakote ER 500 mg twice daily for mood stability  Begin taking Zolpidem 5 mg nightly for sleep

## 2019-03-09 ENCOUNTER — Encounter: Admit: 2019-03-09 | Discharge: 2019-03-09 | Payer: MEDICAID

## 2019-03-09 DIAGNOSIS — F324 Major depressive disorder, single episode, in partial remission: Secondary | ICD-10-CM

## 2019-03-09 NOTE — Telephone Encounter
Hello Dr. Audrea Muscat, Patient concerned about appt scheduled for 03.10.21 (first available 60 min slot). Please advise of a day and time for February as you requested. Also patient mentioned Therapy appts. Do we need to send a POG packet to this patient, please clarify? Thank you.

## 2019-03-14 ENCOUNTER — Encounter: Admit: 2019-03-14 | Discharge: 2019-03-14 | Payer: MEDICAID

## 2019-03-14 NOTE — Telephone Encounter
Spoke with patient regarding surgery date scheduling. I spoke with Dr. Jenean Lindau from the psychology team and she requested we meet together to discuss his case prior to surgery scheduling to discuss if any follow-up steps would be required prior to scheduling surgery. I informed the patient I will contact him after we meet with any follow up steps. I informed him I would call with any updates as soon as able.

## 2019-03-14 NOTE — Telephone Encounter
-----   Message from [employer]. Lockyer sent at 03/10/2019  1:47 PM CST -----  Regarding: RE: Visit Follow-Up Question  Contact: [phone]  i am still awaiting you to get back to me with surgery date. please let me know as soon as possible. thanks

## 2019-03-15 ENCOUNTER — Encounter: Admit: 2019-03-15 | Discharge: 2019-03-15 | Payer: MEDICAID
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
## 2019-03-17 ENCOUNTER — Encounter: Admit: 2019-03-17 | Discharge: 2019-03-17 | Payer: MEDICAID

## 2019-03-17 NOTE — Telephone Encounter
Spoke with patient on a team call including Dr. Sheppard Penton to discuss follow up steps patient will need to complete prior to scheduling bariatric surgery. Informed patient that our psychology team recommended that he be seen by psychology and psychiatry for 6 continuous months starting now with re-evaluation for surgical readiness after that time. Patient expressed that he did not agree with this decision and that he wished to proceed with surgery scheduling now. Dr. Jenean Lindau explained to patient the reason for the additional counseling, stating he needed to be psychologically stable to proceed and that we wanted to make sure he had all the tools he needed in place to proceed safely. Patient again stated he did not agree with this and he would go to a different facility for sooner surgery. He requested we cancel any future follow-up appointment he had with our department or providers, which was completed.

## 2019-03-21 ENCOUNTER — Encounter: Admit: 2019-03-21 | Discharge: 2019-03-21 | Payer: MEDICAID

## 2019-04-03 ENCOUNTER — Encounter: Admit: 2019-04-03 | Discharge: 2019-04-03 | Payer: MEDICAID

## 2019-04-03 NOTE — Telephone Encounter
Patient called and would like to get in sooner with the psychologist than his scheduled appt. Because he is having a VERY hard time. He apologized for getting mad when his surgery date was moved back and said he now understands and wants to continue with our program.  He said he has reached out to [place] a few times and would like to talk to her or Dr. Halina Andreas about what comes next.   He went into a lot of emotional problems he has with me over the phone. I listened and assured him I would pass the information on to Dr. Jenean Lindau and see if we can find a time to get him in sooner with a psychologist.

## 2019-04-04 ENCOUNTER — Encounter: Admit: 2019-04-04 | Discharge: 2019-04-04 | Payer: MEDICAID

## 2019-04-04 NOTE — Telephone Encounter
8:45-9:00am  04/04/2019    Called pt for follow-up after his call to the bariatric clinic yesterday. Provided reintroduction to health psychology services, including limits of confidentiality, inability to provide crisis care, and limits to frequency of visits (i.e., only able to see patients every 2 weeks at most and focused primarily on bariatric surgery preparation/process).     Pt explained that he has been in significant distress lately related to general mental health concerns, and that mood concerns have been exacerbated by the cold weather and his bariatric surgery process. He reported calling the suicide hotline last night (number provided by our clinic yesterday via the phone) and noted that he has been attempting to sleep as much as possible to avoid panic attacks. Pt endorsed recent passive SI, but denied active SI, intent, or plan today during discussion. Safety planning was reviewed, including reaching out to social supports, calling the suicide hotline (number provided again), and calling 911 or presenting to the ED in the event of emergency.    Per patient request, reviewed bariatric team recommendations with pt, including establishing with a general mental health provider for longstanding mental health concerns, meeting with this provider for bariatric-specific support, and establishing and maintaining care with psychiatry (scheduled for appt through [facility] for medication management next month). Pt was provided information for his local mental health agency [facility]) and encouraged him to call to establish with them and/explore other options in the community if he would prefer. Pt informed he is on cancellation list for an earlier appt with this provider, and the importance of establishing with a regular therapist for general mental health concerns was reiterated and highly encouraged.     Sheppard Penton, PhD  Licensed Psychologist  Pager [phone]

## 2019-04-05 ENCOUNTER — Encounter: Admit: 2019-04-05 | Discharge: 2019-04-05 | Payer: MEDICAID

## 2019-04-10 ENCOUNTER — Encounter: Admit: 2019-04-10 | Discharge: 2019-04-10 | Payer: MEDICAID

## 2019-04-10 NOTE — Telephone Encounter
I spoke to the patient again about his case.  He felt like surgery should be in March and not delayed to meet psychology and mental health requirements.  I explained to him that the behavioral component was an important part of the surgery and weight loss journey and that I did not have the ability or desire to override the recommendations of the team.  He asked me to call his PCP which I will.  He mentioned seeking out another program which I told him was his prerogative.  I tried to explain the teams point of view to him but he did not seem to have much insight or appreciation of this point of view.

## 2019-04-12 ENCOUNTER — Encounter: Admit: 2019-04-12 | Discharge: 2019-04-12 | Payer: MEDICAID

## 2019-04-14 ENCOUNTER — Encounter: Admit: 2019-04-14 | Discharge: 2019-04-14 | Payer: MEDICAID

## 2019-04-14 MED ORDER — CARVEDILOL 25 MG PO TAB
ORAL_TABLET | Freq: Two times a day (BID) | ORAL | 0 refills | 90.00000 days | Status: AC
Start: 2019-04-14 — End: ?

## 2019-04-26 ENCOUNTER — Ambulatory Visit: Admit: 2019-04-26 | Discharge: 2019-04-27 | Payer: MEDICAID

## 2019-04-26 ENCOUNTER — Encounter: Admit: 2019-04-26 | Discharge: 2019-04-26 | Payer: MEDICAID

## 2019-04-26 DIAGNOSIS — Z79899 Other long term (current) drug therapy: Secondary | ICD-10-CM

## 2019-04-26 MED ORDER — LORAZEPAM 1 MG PO TAB
.5-1 mg | ORAL_TABLET | Freq: Two times a day (BID) | ORAL | 0 refills | 12.00000 days | Status: DC | PRN
Start: 2019-04-26 — End: 2019-05-29

## 2019-04-26 MED ORDER — CITALOPRAM 40 MG PO TAB
40 mg | ORAL_TABLET | Freq: Every day | ORAL | 1 refills | Status: DC
Start: 2019-04-26 — End: 2019-07-05

## 2019-04-26 MED ORDER — DIVALPROEX 500 MG PO TB24
500 mg | ORAL_TABLET | Freq: Every evening | ORAL | 1 refills | 30.00000 days | Status: DC
Start: 2019-04-26 — End: 2019-06-15

## 2019-04-26 MED ORDER — ZOLPIDEM 5 MG PO TAB
5 mg | ORAL_TABLET | Freq: Every evening | ORAL | 0 refills | 30.00000 days | Status: DC | PRN
Start: 2019-04-26 — End: 2019-05-29

## 2019-04-26 NOTE — Telephone Encounter
Lab orders for CBC, Valproic Acid, and CMP sent via fax to [facility] at fax number: [phone] with confirmed receipt.

## 2019-04-26 NOTE — Progress Notes
Telehealth Visit Note    Date of Service: 04/26/2019    Subjective:      Obtained patient's verbal consent to treat them and their agreement to [facility] financial policy and NPP via this telehealth visit during the [facility]       Thomas Francis is a 49 y.o. male.    History of Present Illness  Patient reports he has been taking all his medications as prescribed. Reports he has lost around 20 pounds of weight since our last encounter. States his PCP has encouraged him and feels he is ready for his bariatric surgery. Reports that when he decided to get weight loss extremely seriously was when he feared he may be placed on short term disability; Reports substantial frustration from being labeled as disabled and not being able to be a productive member of society.  Reports this motivated him to get all the medical clearance and evaluations required to be eligible for surgery.     Reports he started therapy and started getting into subjects he had not talking about previously. Reports increased anxiety from talking about those subjects; in my mind it troubles me. Reports he is very excited about working with his therapist as she has extensive experience in trauma, and he feels confident she'll be able to help. He is still experiencing increased anxiety attacks related to therapy. States he has been thinking about things that he was avoiding.     He went on to speak about all the complications he endured secondary to having complicated DM that provoked HHS; states he became septic and had to come back home and was placed on vancomycin and subsequently developed AKI as a complication. States this motivated him much more to get his weight controlled.     Has drastically changed his diet. Feels he still has some cravings but has very good control over it. Hasn't had any soda in months, which he had used severely before. States he has no starches whatsoever; now eating white meat (not pork). States he is now counting calories very carefully and avoiding portions that include more than 5g of sugar per portion. States he is also going to [facility] to swim and this has helped him lose weight. This weight loss has helped his back pain immensely. Reports also being very cogniscent of the changes his weight have had on the medications he uses; he avoids using as much insulin or antihypertensives.     He currently denies suicidal ideation, homicidal ideation, auditory verbal hallucinations, visual hallucinations, tactile hallucinations.         Review of Systems   Constitutional: Negative for fever.   HENT: Negative for rhinorrhea and sore throat.    Respiratory: Negative for cough and shortness of breath.    Cardiovascular: Negative for chest pain.   Gastrointestinal: Negative for abdominal pain, diarrhea, nausea and vomiting.   Endocrine: Negative for cold intolerance.   Genitourinary: Negative for dysuria and flank pain.   Musculoskeletal: Negative for back pain and neck pain.   Skin: Negative for rash.   Neurological: Negative for dizziness, light-headedness and headaches.   Psychiatric/Behavioral: Negative for agitation, dysphoric mood, hallucinations and suicidal ideas.         Objective:         ? albuterol 0.083% (PROVENTIL) 2.5 mg /3 mL (0.083 %) nebulizer solution Inhale 3 mL solution by nebulizer as directed every 6 hours as needed for Wheezing or Shortness of Breath.   ? albuterol sulfate (PROAIR  HFA) 90 mcg/actuation HFA aerosol inhaler INHALE 2 PUFFS BY MOUTH FOUR TIMES DAILY AS NEEDED FOR SHORTNESS OF BREATH   ? aspirin EC 81 mg tablet Take 81 mg by mouth daily. Take with food.   ? carvediloL (COREG) 25 mg tablet TAKE ONE TABLET BY MOUTH TWICE DAILY WITH FOOD   ? citalopram (CELEXA) 40 mg tablet Take one tablet by mouth daily.   ? diclofenac (VOLTAREN) 1 % topical gel Apply four g topically to affected area four times daily. (Patient taking differently: Apply 4 g topically to affected area four times daily as needed.)   ? divalproex (DEPAKOTE ER) 500 mg ER tablet Take one tablet by mouth at bedtime daily. Take with food.   ? docusate (COLACE) 100 mg capsule Take 100 mg by mouth daily.   ? dulaglutide (TRULICITY) 1.5 mg/0.5 mL injection pen Inject 1.5 mg under the skin every 7 days.   ? famotidine (PEPCID) 20 mg tablet Take 40 mg by mouth daily as needed. Indications: heartburn   ? fluticasone propionate (FLONASE) 50 mcg/actuation nasal spray Apply 1 spray to each nostril as directed twice daily. Shake bottle gently before using.   ? fluticasone propionate (FLOVENT HFA) 110 mcg/actuation inhaler Inhale 2 puffs by mouth into the lungs twice daily.   ? fluticasone-salmeterol (ADVAIR DISKUS) 250-50 mcg inhalation disk Inhale one puff by mouth into the lungs twice daily.   ? gabapentin (NEURONTIN) 800 mg tablet Take 1 tablet by mouth three times daily.   ? glucosamine(+) 500 mg tab Take one tablet by mouth daily.   ? guaiFENesin LA (MUCINEX) 600 mg tablet Take one tablet by mouth twice daily.   ? hydrOXYzine (ATARAX) 25 mg tablet TAKE ONE TABLET BY MOUTH FOUR TIMES DAILY AS NEEDED FOR itching/swelling/anxiety   ? insulin glargine (LANTUS SOLOSTAR) 100 unit/mL (3 mL) injection PEN Inject 60 Units under the skin at bedtime daily.   ? insulin lispro(+) (HUMALOG) 100 unit/mL injection Inject twenty four Units under the skin three times daily before meals.   ? insulin NPH (NOVOLIN N) 100 unit/mL injection Inject 40 Units under the skin twice daily.   ? insulin regular (HUMULIN R) 100 unit/mL soln injection 18 u tid with meals and sliding scale. (Patient taking differently: Inject 24 Units under the skin three times daily.)   ? ipratropium bromide (ATROVENT) 0.02 % nebulizer solution Inhale 1 vial by mouth into the lungs four times daily as needed for Shortness of Breath or Wheezing.   ? lactobacillus rhamnosus (GG) (CULTURELLE) 10 billion cell cap Take one capsule by mouth daily with breakfast.   ? levothyroxine (SYNTHROID) 50 mcg tablet Take 50 mcg by mouth daily 30 minutes before breakfast.   ? lisinopril (ZESTRIL) 5 mg tablet Take one tablet by mouth daily.   ? LORazepam (ATIVAN) 1 mg tablet Take 1 mg by mouth three times daily as needed for Nausea. Indications: anxious   ? melatonin 10 mg tab Take one tablet by mouth at bedtime as needed.   ? metFORMIN-XR (GLUCOPHAGE XR) 500 mg extended release tablet Take 1,000 mg by mouth twice daily with meals.   ? methocarbamoL (ROBAXIN) 750 mg tablet Take 750 mg by mouth twice daily.   ? neomycin-polymyxin-dexameth (MAXITROL) 3.5mg /g-10000unit/g-0.1% ophth oint Apply to right eye as needed   ? neomycin/polymyxin/dexameth (MAXITROL) 3.5mg /mL-10000unit/mL-0.1% ophth susp Apply to right eye as needed   ? omega-3s-dha-epa-fish oil (FISH OIL) 120 mg-180 mg- 60 mg-1,200 mg cpDR Take 1 capsule by mouth daily.   ?  oxyCODONE-acetaminophen(+) (PERCOCET) 7.5-325 mg tablet Take 1-2 tablets by mouth every 6 hours as needed   ? pregabalin (LYRICA) 75 mg capsule Take one capsule by mouth daily.   ? sildenafil(+) (VIAGRA) 100 mg tablet Take one tablet by mouth as Needed for Erectile dysfunction. 30-60 min prior to sexual activity   ? simvastatin (ZOCOR) 20 mg tablet Take 20 mg by mouth at bedtime daily.   ? tamsulosin (FLOMAX) 0.4 mg capsule Take one capsule by mouth twice daily. Do not crush, chew or open capsules. Take 30 minutes following the same meal each day.   ? testosterone cypionate (DEPO-TESTOSTERONE) 200 mg/mL injection Inject 200 mg into the muscle every 14 days.   ? tiZANidine (ZANAFLEX) 4 mg tablet Take one tablet by mouth every 6 hours as needed.   ? vitamins, multiple tablet Take 1 tablet by mouth daily.   ? zolpidem (AMBIEN) 5 mg tablet Take one tablet by mouth at bedtime as needed for Sleep.     There were no vitals filed for this visit.  There is no height or weight on file to calculate BMI.         Physical Exam  Psychiatric:      Comments: MENTAL STATUS EXAMINATION  General/Constitutional: appears stated age, dressed in personal clothes, fair grooming  Eye Contact:  good  Behavior:  Calm, cooperative; appropriate for conversation  Speech:  RRR with normal volume and tone.  Good articulation  Mood: Pretty good  Affect: Full ; mood  congruent  Thought Process:  Linear and goal directed  Thought Content:  denies SI, HI. No evidence of delusions  Perception:  Denies AVH  Associations:  Intact  Insight/Judgment:  fair/fair    Orientation:  AAOx3 (name/year/location)  Recent and remote memory:  grossly intact  Attention span and concentration:  appropriate for conversation  Cognition:  average  Language:  english, fluent  Fund of knowledge and vocabulary:  average     Physical Exam:  Neuro:  No gross neurologic deficits.   Musculoskeletal:   Moves all four extremities spontaneously              Assessment and Plan:  IMPRESSION DIAGNOSIS:    DSM 5 Diagnoses, medical issues, psychosocial stressors  Major depressive disorder, recurrent, severe, with anxious distress  Other trauma and stressor related disorder  History of bipolar disorder  Rule out borderline personality disorder    Differentials to consider:  Narcissistic personality traits      Pertinent medical diagnoses:  Morbid obesity  Hypertension  Hyperlipidemia  Insulin dependent Diabetes Mellitus          Summary/Formulation:   Thomas Francis is a 49 y.o. Caucasian male with a history of major depressive disorder and generalized anxiety disorder alongside self-reported bipolar disorder who presents to establish care with [place] [facility] for persistently low mood and anxiety, which appears to be precipitated by chronic mental health issues. Factors that seem to have predisposed him to current crisis include increased psychosocial stressors. This current problem is maintained by poor medication compliance and multiple medical comorbidities, including morbid obesity. However, protective factors include desire to change and hope for the future. Proposed treatment will consist of  psychopharmacologic and psychotherapeutic interventions.      PLAN:  - Will prescribe Lorazepam 1 mg BID PRN. Patient has been taking 1 mg TID historically, but does not take daily. He takes half a pill sublingual and if that doesn't help, he   takes the second  half. Increased need now as he is in therapy.   - Will continue Citalopram 40 mg nightly for mood and anxiety  - Will continue Depakote ER 500 mg QHS for mood stability  - Will continue Zolpidem 5 mg QHS for sleep   - Ordered CBC, Valproic acid (through), CMP to assess liver function and Depakote level. To be collected in [facility]  - Will benefit from DBT if borderline personality disorder diagnosis is consolidated during our next visits.  Currently receiving TF-CBT from outside therapist.   Lowella Petties completed on this date did not show any unexpected medications    RTC: 4 weeks via telehealth    Seen and discussed with Dr. Hulen Skains.    The proposed treatment plan was discussed with the patient who was provided the opportunity to ask questions and make suggestions regarding alternative treatment.                  60 minutes spent on this patient's encounter with counseling and coordination of care taking >50% of the visit.

## 2019-04-26 NOTE — Progress Notes
Patient gave 2 identifiers, vitals provided by patient and consented to telehealth appointment. Audio and video available.

## 2019-05-05 ENCOUNTER — Ambulatory Visit: Admit: 2019-05-05 | Discharge: 2019-05-06 | Payer: MEDICAID

## 2019-05-24 ENCOUNTER — Encounter: Admit: 2019-05-24 | Discharge: 2019-05-24 | Payer: MEDICAID

## 2019-05-24 NOTE — Progress Notes
Patient case discussed at psychology case conference on 05/24/2019.      Case details:   Attended one visit so far; combative and raised voice   Continued behavioral concerns after first visit   Manipulative and tried to convince provider to schedule surgery anyway   Apologized at end of visit for raised voice, still inappropriate through most of visits     Recommendations:   Make clear expectations that will not change   If patient does not want to complete expectations he has options to go to other centers   If behavior improvement doesn't come to fruitition consider dismissal from program

## 2019-05-26 ENCOUNTER — Encounter: Admit: 2019-05-26 | Discharge: 2019-05-26 | Payer: MEDICAID

## 2019-05-29 MED ORDER — ZOLPIDEM 5 MG PO TAB
ORAL_TABLET | Freq: Every evening | ORAL | 0 refills | 30.00000 days | Status: DC | PRN
Start: 2019-05-29 — End: 2019-06-20

## 2019-05-29 MED ORDER — LORAZEPAM 1 MG PO TAB
ORAL_TABLET | Freq: Two times a day (BID) | ORAL | 0 refills | 12.00000 days | Status: DC | PRN
Start: 2019-05-29 — End: 2019-06-20

## 2019-06-15 MED ORDER — DIVALPROEX 500 MG PO TB24
ORAL_TABLET | Freq: Every evening | ORAL | 1 refills | 30.00000 days | Status: DC
Start: 2019-06-15 — End: 2019-08-29

## 2019-06-20 ENCOUNTER — Encounter: Admit: 2019-06-20 | Discharge: 2019-06-20 | Payer: MEDICAID

## 2019-06-20 MED ORDER — ZOLPIDEM 5 MG PO TAB
ORAL_TABLET | Freq: Every evening | ORAL | 0 refills | 30.00000 days | Status: AC | PRN
Start: 2019-06-20 — End: ?

## 2019-06-20 MED ORDER — LORAZEPAM 1 MG PO TAB
ORAL_TABLET | Freq: Two times a day (BID) | ORAL | 0 refills | 12.00000 days | Status: AC | PRN
Start: 2019-06-20 — End: ?

## 2019-07-05 ENCOUNTER — Encounter: Admit: 2019-07-05 | Discharge: 2019-07-05 | Payer: MEDICAID

## 2019-07-05 MED ORDER — CITALOPRAM 40 MG PO TAB
40 mg | ORAL_TABLET | Freq: Every day | ORAL | 1 refills | Status: AC
Start: 2019-07-05 — End: ?

## 2019-07-11 ENCOUNTER — Ambulatory Visit: Admit: 2019-07-11 | Discharge: 2019-07-12 | Payer: MEDICAID

## 2019-07-12 ENCOUNTER — Encounter: Admit: 2019-07-12 | Discharge: 2019-07-12 | Payer: MEDICAID

## 2019-07-12 NOTE — Progress Notes
Patient case discussed at psychology case conference on 07/12/2019.      Case details:  ? Inappropriate at 3rd visit yesterday  ? Borderline personality disorder possibility  ? Degrading verbiage of other providers  ? Misguided view of his role in the process     Recommendations:  ? Dismissal from program  ? Send letter of notification  ? Cancel upcoming appointments

## 2019-07-17 NOTE — Progress Notes
Obtained patient's verbal consent to treat them and their agreement to [facility] financial policy and NPP via this telehealth visit during the [facility]      Subjective:       History of Present Illness  Thomas Francis is a 49 y.o. male.type 2 diabetes (uncontrolled), hypothyroid,hypertension, morbid obesity, COPD, OSA. chronic pain, and severe depression who was seen today as a follow up for type 2 diabetes.  He was last seen on 02/01/19.  Interval changes:   Still waiting to get bariatric surgery (tentative plan for duodenal switch), it was determined that he would need 6 months of psychotherapy prior to surgery. This caused significant depression and anxiety in waiting.  Otherwise, he had made significant changes in his diet and got his blood sugar to near normal range.  He completed others evaluation including EGD on 01/20/19 which showed a large amount of food residual and will get gastric emptying study scheduled.  He had sleep study and now on CPAP.    He was admitted for cardiac cath on 07/14/2018 which revealed no significant coronary disease.  An echocardiogram revealed normal LV function, no significant valve disease  He continues to note fatigue, chronic severe back pain, extreme fatigue, depressed mood. He continues on testosterone therapy by Dr. Suan Halter, 200 mg q 2 weeks.  This has helped with his mood but not erectile dysfunction.  He also has significant problem with urinary retention, increased frequency and incontinence, referral was sent to urologist in [place] but he has not seen them.    Type 2  Diabetes Mellitus with complications, diagnosed since 2010    Most recent  reported HbA1C around 8.2 (was 10.2 early July 2020 and end of October 2020).    He cut out coke zero adjusted diet to smaller portion.    PTA regimen:-   Lantus  60 units daily and lispro insulin  24 units TID, metformin XR 1000 mg twice daily     Previous therapy; NPH and regular insulin, regular metformin caused GI issues, Trulicity weekly 1.5 mg or victoza or ozempic depends on what he was given as sample, he did not have much side effects with Trulicity however causes sulfur breath  Frequency of blood sugar check 2-3 times daily reported fasting blood sugar in the 160, in stead of 300 range.  Hypoglycemic episodes on this regimen: none  Diabetic-complications assessment:               Retinopathy: due for exam                Peripheral neuropathy: yes               Autonomic neuropathy: no               Nephropathy: unknown, no recent MACR               Macrovascular complications: no               Hx of DKA - no  On ACEi/ARB?:  Lisinopril 5 mg daily  On Statin?: lipitor 10 mg daily  Exercise - limited due to back pain.   Weight changes weight gain due to poor diet. He lost 20 lbs from last year, weight stable from last visit and he will be getting bariatric surgery.  Hypothyroid:  Lt 4 50 mcg daily, most recent TSH of 1.02 06/14/18     SH: on disability with nursing background, no smoking or excessive alcohol  Family history: of multiple family members with type 2 diabetes and morbid obesity.  Father had bypass surgery at age 70  PMH: COPD, hypertension, OSA, chronic lower back pain, hypogonadism  Review of Systems  Constitutional:   Fatigue: yes  Pain: yes  Skin:   Heat or cold intolerance : no  Dry skin : no  Flushing episodes : no  Hair loss : no  Excessive body or facial hair: no  Head   Headaches: no  Double or blurry vision : no  Difficulty swallowing or choking : no  Feeling of food stuck: no  Neck enlargement or lumps : no  Head or chest radiation exposure : no  Decreased hearing : no  Chest/Heart   Nipple discharge : no  Chest pain or pressure: no  Heartburn: no  Shortness of breath: no  Heart racing or pounding :yes  Digestion   Abdominal or belly pain : no  Constipation : no  Diarrhea : no  Nausea and vomiting: no  Reflux : no  Lactose intollerance : no  Reproduction   Difficulty with erections: yes Decreased interest in sex:yes  Urinary   Kidney stones: no   Night time urination : yes  Skeleton   Muscle cramping: yes  Glands   Weight loss : no  Weight gain : no  Nerves   Nervous or anxious : yes  Seizures or falls : no  Dizziness : no  numbenss or tingling :yes  Mental Health   Do you feel sad? :yes  Sleep problems : yes        Objective:         ? albuterol 0.083% (PROVENTIL) 2.5 mg /3 mL (0.083 %) nebulizer solution Inhale 3 mL solution by nebulizer as directed every 6 hours as needed for Wheezing or Shortness of Breath.   ? albuterol sulfate (PROAIR HFA) 90 mcg/actuation HFA aerosol inhaler INHALE 2 PUFFS BY MOUTH FOUR TIMES DAILY AS NEEDED FOR SHORTNESS OF BREATH   ? aspirin EC 81 mg tablet Take 81 mg by mouth daily. Take with food.   ? carvediloL (COREG) 25 mg tablet TAKE ONE TABLET BY MOUTH TWICE DAILY WITH FOOD   ? citalopram (CELEXA) 40 mg tablet Take one tablet by mouth daily.   ? diclofenac (VOLTAREN) 1 % topical gel Apply four g topically to affected area four times daily. (Patient taking differently: Apply 4 g topically to affected area four times daily as needed.)   ? divalproex (DEPAKOTE ER) 500 mg ER tablet TAKE ONE TABLET BY MOUTH AT BEDTIME *TAKE WITH FOOD*   ? docusate (COLACE) 100 mg capsule Take 100 mg by mouth daily.   ? dulaglutide (TRULICITY) 1.5 mg/0.5 mL injection pen Inject 1.5 mg under the skin every 7 days.   ? famotidine (PEPCID) 20 mg tablet Take 40 mg by mouth daily as needed. Indications: heartburn   ? fluticasone propionate (FLONASE) 50 mcg/actuation nasal spray Apply 1 spray to each nostril as directed twice daily. Shake bottle gently before using.   ? fluticasone propionate (FLOVENT HFA) 110 mcg/actuation inhaler Inhale 2 puffs by mouth into the lungs twice daily.   ? fluticasone-salmeterol (ADVAIR DISKUS) 250-50 mcg inhalation disk Inhale one puff by mouth into the lungs twice daily.   ? gabapentin (NEURONTIN) 800 mg tablet Take 1 tablet by mouth three times daily.   ? glucosamine(+) 500 mg tab Take one tablet by mouth daily.   ? guaiFENesin LA (MUCINEX) 600 mg tablet Take one tablet by  mouth twice daily.   ? hydrOXYzine (ATARAX) 25 mg tablet TAKE ONE TABLET BY MOUTH FOUR TIMES DAILY AS NEEDED FOR itching/swelling/anxiety   ? insulin glargine (LANTUS SOLOSTAR) 100 unit/mL (3 mL) injection PEN Inject 60 Units under the skin at bedtime daily.   ? insulin lispro(+) (HUMALOG) 100 unit/mL injection Inject twenty four Units under the skin three times daily before meals.   ? insulin NPH (NOVOLIN N) 100 unit/mL injection Inject 40 Units under the skin twice daily.   ? insulin regular (HUMULIN R) 100 unit/mL soln injection 18 u tid with meals and sliding scale. (Patient taking differently: Inject 24 Units under the skin three times daily.)   ? ipratropium bromide (ATROVENT) 0.02 % nebulizer solution Inhale 1 vial by mouth into the lungs four times daily as needed for Shortness of Breath or Wheezing.   ? lactobacillus rhamnosus (GG) (CULTURELLE) 10 billion cell cap Take one capsule by mouth daily with breakfast.   ? levothyroxine (SYNTHROID) 50 mcg tablet Take 50 mcg by mouth daily 30 minutes before breakfast.   ? lisinopril (ZESTRIL) 5 mg tablet Take one tablet by mouth daily.   ? LORazepam (ATIVAN) 1 mg tablet TAKE ONE-HALF TO ONE TABLET BY MOUTH TWICE DAILY AS NEEDED   ? melatonin 10 mg tab Take one tablet by mouth at bedtime as needed.   ? metFORMIN-XR (GLUCOPHAGE XR) 500 mg extended release tablet Take 1,000 mg by mouth twice daily with meals.   ? methocarbamoL (ROBAXIN) 750 mg tablet Take 750 mg by mouth twice daily.   ? neomycin-polymyxin-dexameth (MAXITROL) 3.5mg /g-10000unit/g-0.1% ophth oint Apply to right eye as needed   ? neomycin/polymyxin/dexameth (MAXITROL) 3.5mg /mL-10000unit/mL-0.1% ophth susp Apply to right eye as needed   ? omega-3s-dha-epa-fish oil (FISH OIL) 120 mg-180 mg- 60 mg-1,200 mg cpDR Take 1 capsule by mouth daily.   ? oxyCODONE-acetaminophen(+) (PERCOCET) 7.5-325 mg tablet Take 1-2 tablets by mouth every 6 hours as needed   ? pregabalin (LYRICA) 75 mg capsule Take one capsule by mouth daily.   ? sildenafil(+) (VIAGRA) 100 mg tablet Take one tablet by mouth as Needed for Erectile dysfunction. 30-60 min prior to sexual activity   ? simvastatin (ZOCOR) 20 mg tablet Take 20 mg by mouth at bedtime daily.   ? tamsulosin (FLOMAX) 0.4 mg capsule Take one capsule by mouth twice daily. Do not crush, chew or open capsules. Take 30 minutes following the same meal each day.   ? testosterone cypionate (DEPO-TESTOSTERONE) 200 mg/mL injection Inject 200 mg into the muscle every 14 days.   ? tiZANidine (ZANAFLEX) 4 mg tablet Take one tablet by mouth every 6 hours as needed.   ? vitamins, multiple tablet Take 1 tablet by mouth daily.   ? zolpidem (AMBIEN) 5 mg tablet TAKE ONE TABLET BY MOUTH AT BEDTIME AS NEEDED     There were no vitals filed for this visit.  There is no height or weight on file to calculate BMI.     Physical Exam  General Appearance:morbidly obesed, no distress   Respiratory Effort: breathing comfortably, no respiratory distress   Gait & Station: walks without assistance Orientation: oriented to time, place and person   Affect & Mood: appropriate and sustained affect   Language and Memory: patient responsive and seems to comprehend information   Neurologic Exam: neurological assessment grossly intact   Other: moves all extremities           Results for Thomas Francis, Thomas Francis (MRN [id number]) as of 02/01/2019 17:12   Ref.  Range 01/09/2019 12:42   Sodium Latest Ref Range: 137 - 147 MMOL/L 136 (L)   Potassium Latest Ref Range: 3.5 - 5.1 MMOL/L 4.0   Chloride Latest Ref Range: 98 - 110 MMOL/L 101   CO2 Latest Ref Range: 21 - 30 MMOL/L 23   Anion Gap Latest Ref Range: 3 - 12  12   Blood Urea Nitrogen Latest Ref Range: 7 - 25 MG/DL 15   Creatinine Latest Ref Range: 0.4 - 1.24 MG/DL 9.60   eGFR Non African American Latest Ref Range: >60 mL/min >60   eGFR African American Latest Ref Range: >60 mL/min >60   Glucose Latest Ref Range: 70 - 100 MG/DL 454 (H)   Albumin Latest Ref Range: 3.5 - 5.0 G/DL 4.0   Calcium Latest Ref Range: 8.5 - 10.6 MG/DL 9.6   Total Bilirubin Latest Ref Range: 0.3 - 1.2 MG/DL 0.4   Total Protein Latest Ref Range: 6.0 - 8.0 G/DL 7.4   AST (SGOT) Latest Ref Range: 7 - 40 U/L 17   ALT (SGPT) Latest Ref Range: 7 - 56 U/L 12   Alk Phosphatase Latest Ref Range: 25 - 110 U/L 47       Assessment and Plan:  1.Type 2  Diabetes Mellitus with complications, uncontrolled   HgbA1C  reported HbA1C improved to around 8.2 in February 2021, will contact Dr. Gilles Chiquito office to get his most recent lab as well as order sent for another A1c since it is due.  He is in the process of getting bariatric surgery which should lead to significant improvement in his diabetes.  Continue metformin XR 1000 mg twice daily  Continue Lantus 60 units daily by 2 units twice a week to keep FBS less than 140 without low.   Continue lispro insulin 24 units with meals 3 times daily  Okay to hold off GLP-1 due to possible gastroparesis based on EGD.  Had diabetes education during most recent admission in spring 2019.  When he started clear liquid diet, he will reduce Lantus to 42 units at bedtime daily as well as lispro to 12 units with meals 3 times daily.  Also add on correction factor II units for every 50 points above 150.  2  Diet and life style modifications  3. Hypertension  goal less than 130/80.    4. Lipid  Lipitor 10 mg daily.  5.Diabetes microvascular complications: history of retinopathy and due for an eye exam.  No records of microalbumin over creatinine ratio.   6.Hypothyroid: Lt 4 50 mcg daily, most recent TSH of  1.02 on 06/14/18, due for lab if has not been done  7.  Erectile dysfunction this is multifactorial including chronic pain, uncontrolled diabetes, hypertension, depression, pain medication.  He was prescribed Viagra which has not helped.  Testosterone therapy is not a primary treatment for erectile dysfunction as well but addressing factors and weight loss could help.  Referral to urology in [place] as patient's request.  8.  Hypogonadism most likely related to multiple factors mentioned above.  He is taking testosterone therapy and will get labs through Dr. Andreas Newport.  Return to clinic in 3 months.  Sooner once date of bariatric surgery is set    Total time reviewing chart and visit 30 minutes.  Estimated counseling time 20 minutes.

## 2019-07-19 ENCOUNTER — Encounter: Admit: 2019-07-19 | Discharge: 2019-07-19 | Payer: MEDICAID

## 2019-07-19 ENCOUNTER — Ambulatory Visit: Admit: 2019-07-19 | Discharge: 2019-07-20 | Payer: MEDICAID

## 2019-07-19 DIAGNOSIS — J45909 Unspecified asthma, uncomplicated: Secondary | ICD-10-CM

## 2019-07-19 DIAGNOSIS — J449 Chronic obstructive pulmonary disease, unspecified: Secondary | ICD-10-CM

## 2019-07-19 DIAGNOSIS — I10 Essential (primary) hypertension: Secondary | ICD-10-CM

## 2019-07-19 DIAGNOSIS — E119 Type 2 diabetes mellitus without complications: Secondary | ICD-10-CM

## 2019-07-19 DIAGNOSIS — G629 Polyneuropathy, unspecified: Secondary | ICD-10-CM

## 2019-07-19 DIAGNOSIS — E669 Obesity, unspecified: Secondary | ICD-10-CM

## 2019-07-19 DIAGNOSIS — R7989 Other specified abnormal findings of blood chemistry: Secondary | ICD-10-CM

## 2019-07-19 DIAGNOSIS — G4730 Sleep apnea, unspecified: Secondary | ICD-10-CM

## 2019-07-19 DIAGNOSIS — E291 Testicular hypofunction: Secondary | ICD-10-CM

## 2019-07-19 DIAGNOSIS — G4733 Obstructive sleep apnea (adult) (pediatric): Secondary | ICD-10-CM

## 2019-07-19 DIAGNOSIS — E039 Hypothyroidism, unspecified: Secondary | ICD-10-CM

## 2019-07-19 NOTE — Progress Notes
Obtained patient's verbal consent to treat them and their agreement to [facility] financial policy.

## 2019-07-19 NOTE — Telephone Encounter
Left message that this is an attempt to start the visit for Dr Lajoyce Corners that is due to start at 2:20.  Will call back.  Patient states he has no changes to his record, but can't be on the phone before 2:20.  Informed patient that we can up date his record.  Verified he can get into My Chart at 2:20.  Thomas Francis

## 2019-07-19 NOTE — Patient Instructions
Plan to contact [facility] [phone] to get your previous labs and plan for A1C and thyroid test is not done recently/.

## 2019-07-20 DIAGNOSIS — E1165 Type 2 diabetes mellitus with hyperglycemia: Principal | ICD-10-CM

## 2019-08-03 ENCOUNTER — Encounter: Admit: 2019-08-03 | Discharge: 2019-08-03 | Payer: MEDICAID

## 2019-08-04 ENCOUNTER — Encounter: Admit: 2019-08-04 | Discharge: 2019-08-04 | Payer: MEDICAID

## 2019-08-04 NOTE — Telephone Encounter
Pt was called to relate that the Bariatric Surgery practice is not willing to provide services based on inappropriate and belligerent behaviors as documented by Dr. Sheppard Penton and Dr. Halina Andreas.  Letter has sent by regular and certified mail.  Pt reported being disappointed and requested his medical records. I attached a request of records information sheet.  No further questions.

## 2019-08-16 ENCOUNTER — Encounter: Admit: 2019-08-16 | Discharge: 2019-08-16 | Payer: MEDICAID

## 2019-08-16 IMAGING — CR LOW_EXM
2 series · 2 of 2 positions shown · non-contrast
Comparison: none

[foot]
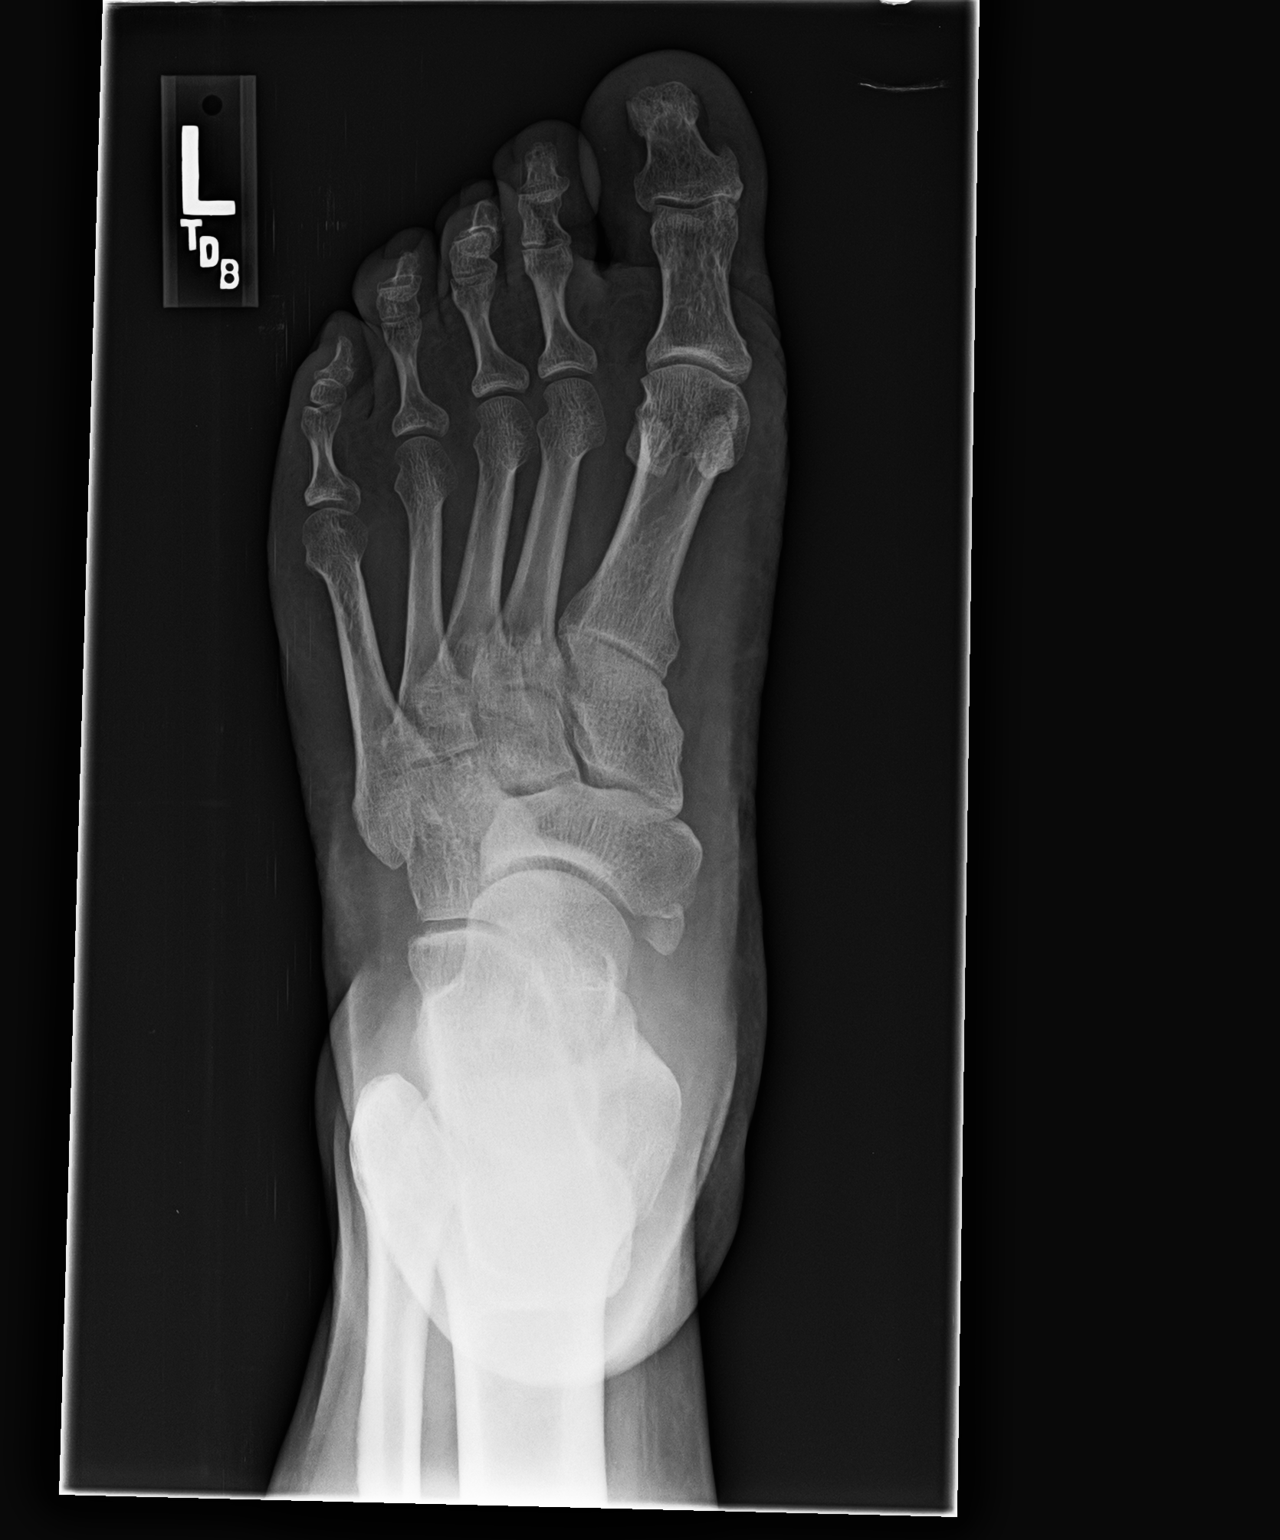

[foot lat]
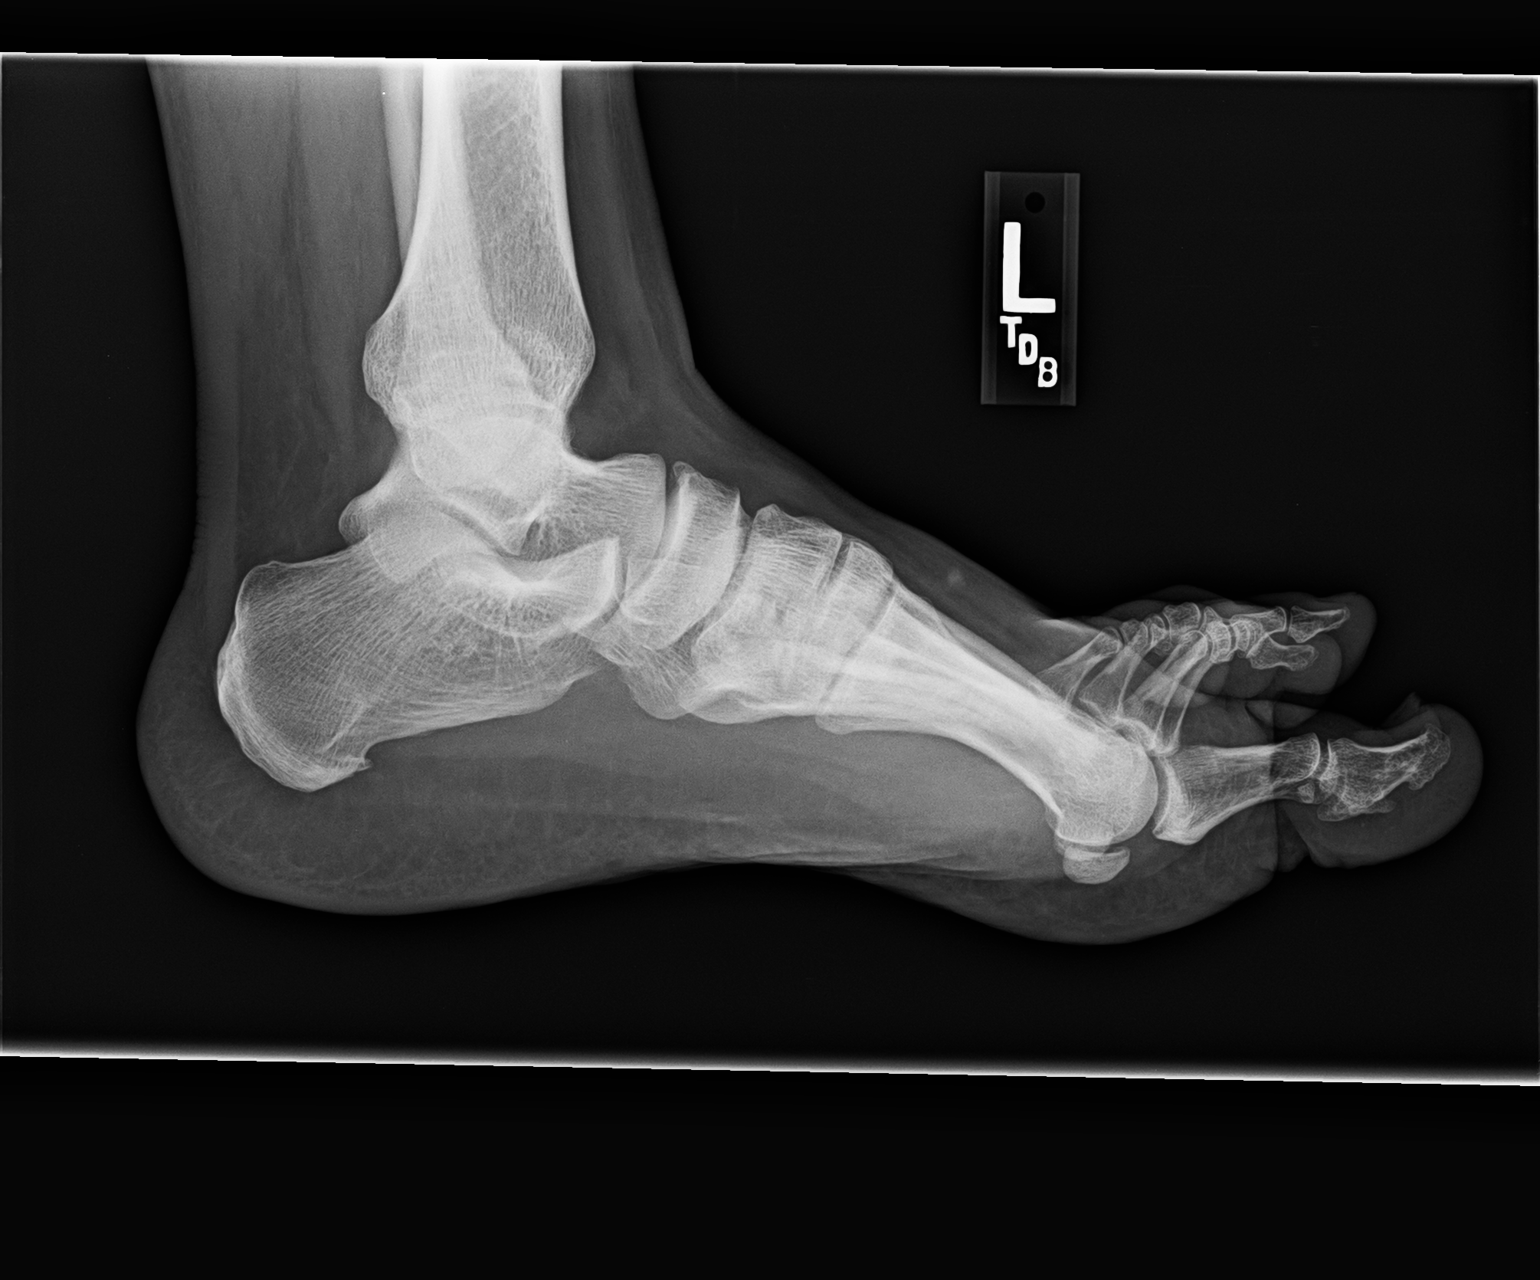

[2 of 2 positions shown; findings below may reference images not displayed]

DIAGNOSTIC STUDIES

EXAM

XR foot LT min 3V

INDICATION

L heel pain
Patient states that he fell off of a curb about two months ago and has been having pain in his left
heel ever since.  TB

TECHNIQUE

AP lateral and oblique views left foot.

COMPARISONS

None available

FINDINGS

Mild degenerative changes of the 1st MTP joint are noted. No fractures are evident.

IMPRESSION

Mild degenerative changes of the 1st MTP. No fractures are seen.

Tech Notes:

Patient states that he fell off of a curb about two months ago and has been having pain in his left
heel ever since.  TB

## 2019-08-16 IMAGING — CR CHEST
1 series · 1 of 1 positions shown · non-contrast
Comparison: none

[chest lat]
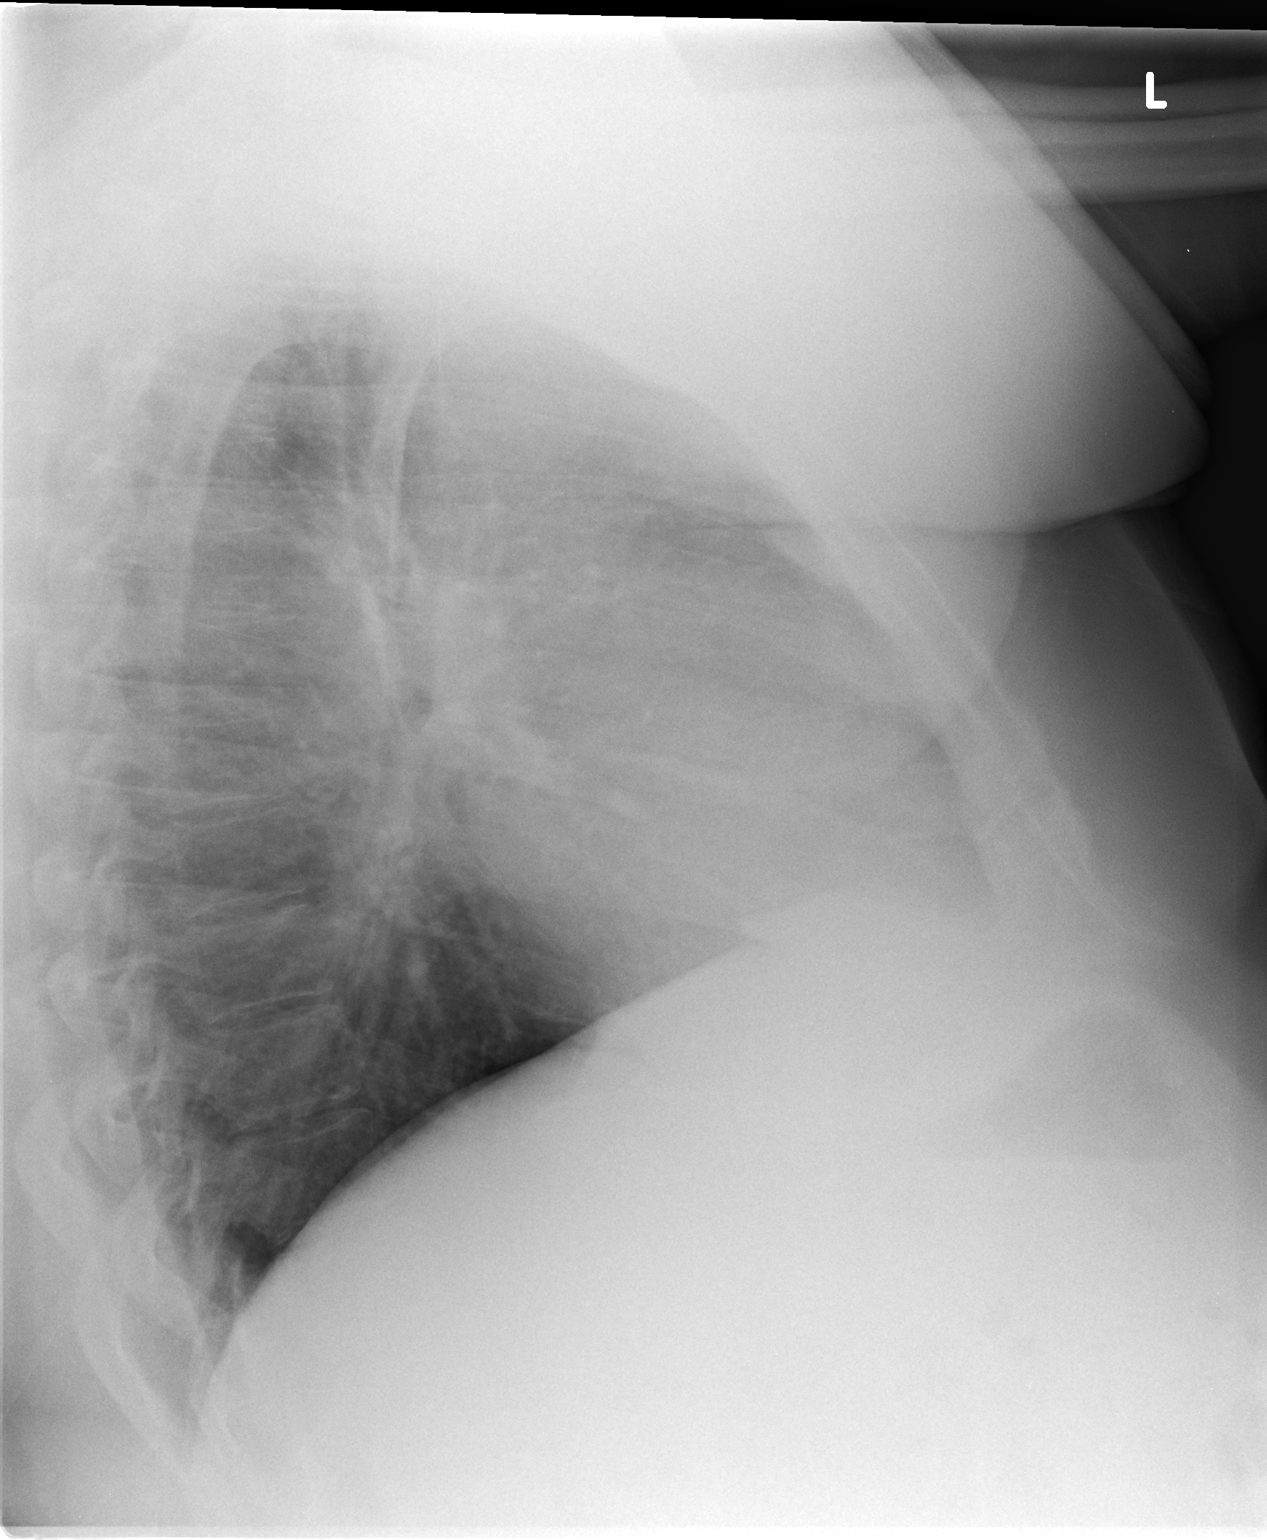

[1 of 1 positions shown; findings below may reference images not displayed]

DIAGNOSTIC STUDIES

EXAM

XR chest 2V

INDICATION

SOB
Patient states that he has had a cough for 2 weeks, worsening shortness of breath, and wheezing.
He has a history of COPD and asthma.  TB/HB

TECHNIQUE

PA and lateral views of the chest.

COMPARISONS

May 21, 2017

FINDINGS

Cardiomediastinal silhouette is within normal limits. Lungs are clear. Bony thorax is unremarkable.

IMPRESSION

No evidence for active disease in the chest.

Tech Notes:

Patient states that he has had a cough for 2 weeks, worsening shortness of breath, and wheezing.  He
has a history of COPD and asthma.  TB/HB

## 2019-08-29 ENCOUNTER — Encounter: Admit: 2019-08-29 | Discharge: 2019-08-29 | Payer: MEDICAID

## 2019-08-29 MED ORDER — DIVALPROEX 500 MG PO TB24
ORAL_TABLET | Freq: Every evening | ORAL | 1 refills | 30.00000 days | Status: AC
Start: 2019-08-29 — End: ?

## 2019-10-05 ENCOUNTER — Encounter: Admit: 2019-10-05 | Discharge: 2019-10-05 | Payer: MEDICAID

## 2019-10-05 MED ORDER — CITALOPRAM 40 MG PO TAB
ORAL_TABLET | Freq: Every day | 2 refills | Status: AC
Start: 2019-10-05 — End: ?

## 2019-10-05 NOTE — Telephone Encounter
Called patient to schedule a follow up and he stated he would like to be called back at a later time.

## 2019-11-09 ENCOUNTER — Encounter: Admit: 2019-11-09 | Discharge: 2019-11-09 | Payer: MEDICAID

## 2019-11-09 MED ORDER — DIVALPROEX 500 MG PO TB24
ORAL_TABLET | Freq: Every evening | ORAL | 1 refills | 30.00000 days | Status: AC
Start: 2019-11-09 — End: ?

## 2019-11-09 NOTE — Telephone Encounter
Patient has been scheduled for 11/03 at 1:30 with Dr. Katharine Look. Thank you!

## 2019-12-05 ENCOUNTER — Encounter: Admit: 2019-12-05 | Discharge: 2019-12-05 | Payer: MEDICAID

## 2019-12-05 NOTE — Telephone Encounter
Had a recent hospital stay for 8 days at [facility]. Pt would like to discuss next steps. Pt had CXR and CT while hospitalized.   RN will request records.

## 2019-12-05 NOTE — Telephone Encounter
Spoke with patient, thanks thanks past that time is getting the best task last admitted to [facility] for 8 days, spider bite in Aug, infection did not improve with oral ATB, needed IV vancomycin, then he got very short of breath with fluid and developed CHF.  He is currently feeling short of breath now.  Bariatric surgery is scheduled in Jan. He is having some low BS but tends to be preventable by taking bedtime snack. A1C was 7.8. Continue Lantus  60 units daily and lispro insulin  24 units TID, metformin XR 1000 mg twice daily.  Would like to see urology but could not get in until January 26, he is currently on the wait list.  I will find a telehealth opening to see him on December 1 at 3:20 pm.

## 2019-12-06 ENCOUNTER — Encounter: Admit: 2019-12-06 | Discharge: 2019-12-06 | Payer: MEDICAID

## 2019-12-06 NOTE — Telephone Encounter
Spoke with patient regarding his concerns.   Reviewed his normal breathing studies (PFTs) as well as his sleep study.   He states he is wearing his CPAP however I have not had a follow up appointment with him in over a year and since he started wearing PAP. Will ask my nursing team to pull a PAP download and get him set up for follow up. Recommend he also contact his PCP to have post hospitalization follow up appointment  ns

## 2019-12-06 NOTE — Telephone Encounter
Pt agreeable to an appointment on 12/08/19 at 240 pm. He requested a virtual appointment.
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
## 2019-12-07 NOTE — Progress Notes
Telehealth Visit Note    Date of Service: 12/20/2019    Subjective:      Obtained patient's verbal consent to treat them and their agreement to [facility] financial policy and NPP via this telehealth visit during the [facility]       Thomas Francis is a 49 y.o. male.    History of Present Illness  Thomas Francis is a 49 y.o. Caucasian male with a history of major depressive disorder and generalized anxiety disorder alongside self-reported bipolar disorder who presents to follow up care with [place] [facility].    Reports he had been doing well in the past three months. He recently had an infection that was treated with linezolid, but states that he did well without Celexa for three weeks.  Is not undergoing bariatric surgery at [facility] anymore, but will pursue at a different site.  States he had a poor interaction with the bariatrics team here, and is not recommending them to anyone he knows.    Mood- States mood has been doing well in the past three months. Denies any depression. Has been enjoying things and has been doing therapy and getting better at coping with his anxiety and mood. Reports he has been eating well sand controlled, but still having issues sleeping. Has been using the CPAP but he feels it is difficulty to get used to it- interested in a new apparatus for the treatment of OSA. Still using Ambien for sleep, and does use ativan as needed but has been using less frequently. Denies guilt, hopelessness, or irritability. Denies suicidal or homicidal ideations.     Anxiety- Reports that his anxiety has been well controlled in the past three months with the use of medications and therapy. Feels better controlled and denies any overt muscle tention, irritability, or fatigue.  Denies any recent panic attacks.    Recently spoke to his therapist and they have very little suspicion that he actually suffers from bipolar disorder, which we had discussed previously.  He is interesting in being able to reduce his medication burden is much as possible.      He currently denies suicidal ideation, homicidal ideation, auditory verbal hallucinations, visual hallucinations, tactile hallucinations      Current stressors: Chronic medical illnesses  Protective Factors: Hope for the future     Psychiatric history update since last visit  - Patient denies any hospitalizations since last visit     Medical history update since last visit  - Patient denies changes in medical conditions since last visit     Substance use updates since last visit  - Patient denies any use of drugs or alcohol use since last visit; continued sobriety     Social history update since last visit  - Patient continues to live at home with self         Review of Systems   Constitutional: Negative for fever.   HENT: Negative for rhinorrhea and sore throat.    Respiratory: Negative for cough and shortness of breath.    Cardiovascular: Negative for chest pain.   Gastrointestinal: Negative for abdominal pain, diarrhea, nausea and vomiting.   Endocrine: Negative for cold intolerance.   Genitourinary: Negative for dysuria and flank pain.   Musculoskeletal: Negative for back pain and neck pain.   Skin: Negative for rash.   Neurological: Negative for dizziness, light-headedness and headaches.   Psychiatric/Behavioral: Negative for agitation, dysphoric mood, hallucinations and suicidal ideas.  Objective:         ? albuterol 0.083% (PROVENTIL) 2.5 mg /3 mL (0.083 %) nebulizer solution Inhale 3 mL solution by nebulizer as directed every 6 hours as needed for Wheezing or Shortness of Breath.   ? albuterol sulfate (PROAIR HFA) 90 mcg/actuation HFA aerosol inhaler INHALE 2 PUFFS BY MOUTH FOUR TIMES DAILY AS NEEDED FOR SHORTNESS OF BREATH   ? aspirin EC 81 mg tablet Take 81 mg by mouth daily. Take with food.   ? carvediloL (COREG) 25 mg tablet TAKE ONE TABLET BY MOUTH TWICE DAILY WITH FOOD   ? citalopram (CELEXA) 40 mg tablet TAKE ONE TABLET BY MOUTH EVERY DAY   ? diclofenac (VOLTAREN) 1 % topical gel Apply four g topically to affected area four times daily. (Patient taking differently: Apply 4 g topically to affected area four times daily as needed.)   ? divalproex (DEPAKOTE ER) 500 mg ER tablet TAKE ONE TABLET BY MOUTH AT BEDTIME *TAKE WITH FOOD*   ? docusate (COLACE) 100 mg capsule Take 100 mg by mouth daily.   ? dulaglutide (TRULICITY) 1.5 mg/0.5 mL injection pen Inject 1.5 mg under the skin every 7 days.   ? famotidine (PEPCID) 20 mg tablet Take 40 mg by mouth daily as needed. Indications: heartburn   ? fluticasone propionate (FLONASE) 50 mcg/actuation nasal spray Apply 1 spray to each nostril as directed twice daily. Shake bottle gently before using.   ? fluticasone propionate (FLOVENT HFA) 110 mcg/actuation inhaler Inhale 2 puffs by mouth into the lungs twice daily.   ? fluticasone-salmeterol (ADVAIR DISKUS) 250-50 mcg inhalation disk Inhale one puff by mouth into the lungs twice daily.   ? gabapentin (NEURONTIN) 800 mg tablet Take 1 tablet by mouth three times daily.   ? glucosamine(+) 500 mg tab Take one tablet by mouth daily.   ? guaiFENesin LA (MUCINEX) 600 mg tablet Take one tablet by mouth twice daily.   ? hydrOXYzine (ATARAX) 25 mg tablet TAKE ONE TABLET BY MOUTH FOUR TIMES DAILY AS NEEDED FOR itching/swelling/anxiety   ? insulin glargine (LANTUS SOLOSTAR) 100 unit/mL (3 mL) injection PEN Inject 60 Units under the skin at bedtime daily.   ? insulin lispro(+) (HUMALOG) 100 unit/mL injection Inject twenty four Units under the skin three times daily before meals.   ? insulin NPH (NOVOLIN N) 100 unit/mL injection Inject 40 Units under the skin twice daily.   ? insulin regular (HUMULIN R) 100 unit/mL soln injection 18 u tid with meals and sliding scale. (Patient taking differently: Inject 24 Units under the skin three times daily.)   ? ipratropium bromide (ATROVENT) 0.02 % nebulizer solution Inhale 1 vial by mouth into the lungs four times daily as needed for Shortness of Breath or Wheezing.   ? lactobacillus rhamnosus (GG) (CULTURELLE) 10 billion cell cap Take one capsule by mouth daily with breakfast.   ? levothyroxine (SYNTHROID) 50 mcg tablet Take 50 mcg by mouth daily 30 minutes before breakfast.   ? lisinopril (ZESTRIL) 5 mg tablet Take one tablet by mouth daily.   ? LORazepam (ATIVAN) 1 mg tablet TAKE ONE-HALF TO ONE TABLET BY MOUTH TWICE DAILY AS NEEDED   ? melatonin 10 mg tab Take one tablet by mouth at bedtime as needed.   ? metFORMIN-XR (GLUCOPHAGE XR) 500 mg extended release tablet Take 1,000 mg by mouth twice daily with meals.   ? methocarbamoL (ROBAXIN) 750 mg tablet Take 750 mg by mouth twice daily.   ? neomycin-polymyxin-dexameth (MAXITROL) 3.5mg /g-10000unit/g-0.1% ophth oint  Apply to right eye as needed   ? neomycin/polymyxin/dexameth (MAXITROL) 3.5mg /mL-10000unit/mL-0.1% ophth susp Apply to right eye as needed   ? omega-3s-dha-epa-fish oil (FISH OIL) 120 mg-180 mg- 60 mg-1,200 mg cpDR Take 1 capsule by mouth daily.   ? oxyCODONE-acetaminophen(+) (PERCOCET) 7.5-325 mg tablet Take 1-2 tablets by mouth every 6 hours as needed   ? pregabalin (LYRICA) 75 mg capsule Take one capsule by mouth daily.   ? sildenafil(+) (VIAGRA) 100 mg tablet Take one tablet by mouth as Needed for Erectile dysfunction. 30-60 min prior to sexual activity   ? simvastatin (ZOCOR) 20 mg tablet Take 20 mg by mouth at bedtime daily.   ? tamsulosin (FLOMAX) 0.4 mg capsule Take one capsule by mouth twice daily. Do not crush, chew or open capsules. Take 30 minutes following the same meal each day.   ? testosterone cypionate (DEPO-TESTOSTERONE) 200 mg/mL injection Inject 200 mg into the muscle every 14 days.   ? tiZANidine (ZANAFLEX) 4 mg tablet Take one tablet by mouth every 6 hours as needed.   ? vitamins, multiple tablet Take 1 tablet by mouth daily.   ? zolpidem (AMBIEN) 5 mg tablet TAKE ONE TABLET BY MOUTH AT BEDTIME AS NEEDED     There were no vitals filed for this visit.  There is no height or weight on file to calculate BMI.         Physical Exam  Psychiatric:      Comments: MENTAL STATUS EXAMINATION  General/Constitutional:  appears stated age, dressed in personal clothes, fair grooming  Eye Contact:  good  Behavior:  Calm, cooperative; appropriate for conversation  Speech:  RRR with normal volume and tone.  Good articulation  Mood: Good  Affect: Full ; mood  congruent  Thought Process:  Linear and goal directed  Thought Content:  denies SI, HI. No evidence of delusions  Perception:  Denies AVH  Associations:  Intact  Insight/Judgment:  fair/fair      Physical Exam:  Neuro:  No gross neurologic deficits.   Musculoskeletal:   Moves all four extremities spontaneously                  Assessment and Plan:  IMPRESSION DIAGNOSIS:    DSM 5 Diagnoses, medical issues, psychosocial stressors  Major depressive disorder, recurrent, moderate-stable  Other trauma and stressor related disorder-stable  History of bipolar disorder  Rule out borderline personality disorder    Differentials to consider:  Cluster B traits      Pertinent medical diagnoses:  Morbid obesity-improving  Chronic obstructive pulmonary disease  Hypertension  Hyperlipidemia  Hyperthyroidism  Insulin dependent Diabetes Mellitus          PLAN:  -Citalopram 40 mg nightly for mood and anxiety  -Lorazepam 1 mg twice daily as needed for anxiety  -Depakote ER 500 mg nightly for one month for mood stability    >Ordered Depakote ER 250 mg nightly for one month afterwards, then discontinue   -Zolpidem 5 mg nightly for sleep   -Continue receiving TF-CBT from outside therapist.   Lowella Petties completed on this date did not show any unexpected medications      - Potential side effects of SSRI/SNRI discussed with patient, including sedation, GI distress, weight gain, sexual dysfunction, sleep disturbance, serotonin syndrome and suicidal ideation. Patient verbalized understanding of the risks vs. benefits of medications and has agreed to treatment.     - Discussed potential side effects of Benzodiazepine use including but not limited to: cognitive dysfunction, falls, tolerance,  dependence, rebound anxiety, and potentially fatal respiratory depression especially when combined with opioids and/or alcohol. Discussed risk of withdrawal with abrupt cessation at higher doses, which can be fatal if not under medical care. Discussed that since these are controlled medications, that they cannot be prescribed if they are using illicit substances, may be asked for random UDS, that they can only be prescribed in clinic by the primary provider, and refills by on call providers on nights or weekends will not be granted. Discussed that paper prescriptions cannot be replaced if lost/stolen; it is the patient's responsibility to safely store the prescriptions. Discussed that if they are running out of medication and will not be seen in clinic in time, that they need to call at least 1 week in advance to allow time for them to discuss the situation with their provider and determine the next best step in care. Discussed that we must be the sole provider of this class of medications for safety, and that prescribing will be terminated if obtaining from other providers.      Discussion  1. The proposed treatment plan was discussed with the patient who was provided the opportunity to actively take part finalizing the current treatment plan.   ? Discussed risks, benefits and potential side effects of medications with patient and guardian as well as alternative treatments  ? Discussed relevant black box warnings   ? Patient reported understanding of the risks, benefits and possible side effects and provided consent for treatment unless otherwise stated  ? All questions and concerns regarding the abovementioned plan were discussed in simple terms  2. Discussed contingency/emergency plans with the patient should there be concern for attempting suicide, committing self-harm or other potential injuries to self or others  1. These plans include:  ? Contacting the mental health clinic (calling, walk-in, etc.)  ? Calling 911 or calling the crisis line  ? Visiting the ER at any time      RTC: 3 months via telehealth    Seen and discussed with Dr. Alphonsus Sias             60 minutes spent on this patient's encounter with counseling and coordination of care taking >50% of the visit.

## 2019-12-08 ENCOUNTER — Ambulatory Visit: Admit: 2019-12-08 | Discharge: 2019-12-09 | Payer: MEDICAID

## 2019-12-08 ENCOUNTER — Encounter: Admit: 2019-12-08 | Discharge: 2019-12-08 | Payer: MEDICAID

## 2019-12-08 DIAGNOSIS — G4730 Sleep apnea, unspecified: Secondary | ICD-10-CM

## 2019-12-08 DIAGNOSIS — J45909 Unspecified asthma, uncomplicated: Secondary | ICD-10-CM

## 2019-12-08 DIAGNOSIS — G4733 Obstructive sleep apnea (adult) (pediatric): Secondary | ICD-10-CM

## 2019-12-08 DIAGNOSIS — E119 Type 2 diabetes mellitus without complications: Secondary | ICD-10-CM

## 2019-12-08 DIAGNOSIS — E039 Hypothyroidism, unspecified: Secondary | ICD-10-CM

## 2019-12-08 DIAGNOSIS — J449 Chronic obstructive pulmonary disease, unspecified: Secondary | ICD-10-CM

## 2019-12-08 DIAGNOSIS — G629 Polyneuropathy, unspecified: Secondary | ICD-10-CM

## 2019-12-08 DIAGNOSIS — R7989 Other specified abnormal findings of blood chemistry: Secondary | ICD-10-CM

## 2019-12-08 DIAGNOSIS — E669 Obesity, unspecified: Secondary | ICD-10-CM

## 2019-12-08 DIAGNOSIS — I10 Essential (primary) hypertension: Secondary | ICD-10-CM

## 2019-12-08 NOTE — Patient Instructions
Obstructive?Sleep Apnea  Obstructive sleep apnea is a condition that causes your air passages to become narrowed or blocked during sleep. As a result, breathing stops for short periods. Your body wakes up enough for breathing to start again,?though you don't remember it. The cycle of stopped breathing and brief awakenings can repeat dozens of times a night. This prevents the body from getting to the deeper stages of sleep that are needed for good rest and may cause your body's oxygen level to fall.  Signs of sleep apnea include loud snoring, noisy breathing, and gasping sounds during sleep. Daytime symptoms include waking up tired after a full night's sleep,?waking up with headaches, feeling very sleepy or falling asleep during the day,?and having problems with memory or concentration.  Risk factors for sleep apnea include:  ? Being overweight  ? Being a man, or a woman in menopause  ? Smoking  ? Using alcohol or sedating medicines  ? Having enlarged structures in the nose or throat such as enlarged tonsils or adenoids, or extra tissue in the airway  Home care  Lifestyle changes that can help treat snoring and sleep apnea include the following:  ? If you are overweight, lose weight. Talk with your healthcare provider about a weight-loss plan for you.  ? Don't drink alcohol for 3 to 4 hours before bedtime. Don't take sedating medicines. Ask your healthcare provider about the medicines you take.  ? If you smoke, talk to your healthcare provider about ways to quit. It's important to stay away from secondhand smoke. don't use e-cigarettes because of their harmful side effects.  ? Sleep on your side. This can help prevent gravity from pulling relaxed throat tissues into your breathing passages.  ? If you have allergies or sinus problems that block your nose, ask your healthcare provider for help.  ? Use positive airway pressure (PAP): Discuss with your healthcare provider the benefits of using PAP at home and the type of PAP that is best for you.  Follow-up care  Follow up with your healthcare provider, or as advised.?A diagnosis of sleep apnea is made with a sleep study. Your healthcare provider can tell you more about this test.  When to seek medical advice  See your healthcare provider if you have daytime symptoms of sleep apnea. These include:  ? Waking up tired after a full night's sleep  ? Waking up with a headache  ? Feeling very sleepy or falling asleep during the day  ? Having problems with memory or concentration  Also talk with your provider if your partner tells you that you snore, gasp for air, or stop breathing while you sleep.  Seeing your provider is important because sleep apnea can make you more likely to have certain health problems. These include high blood pressure, heart attack, stroke, and sexual dysfunction. If you have sleep apnea, talk with your healthcare provider about the best treatments for you.  [employer] last reviewed this educational content on 10/17/2017  ? 2000-2021 [employer], [place]. All rights reserved. This information is not intended as a substitute for professional medical care. Always follow your healthcare professional's instructions.        For up to date information on the COVID-19 virus, visit the [facility] website. [email or web address]  ? General supportive care during cold and flu season and infection prevention reminders:    o Wash hands often with soap and water for at least 20 seconds   o Cover your mouth and nose   o  Social distancing: try to maintain 6 feet between you and other people   o Stay home if sick and symptoms mild or manageable?  ? If you must be around people wear a mask    ? If you are having symptoms of a lower respiratory infection (cough, shortness of breath) and/or fever AND either traveled in last 30 days (internationally or to region of exposure) OR known exposure to patient with COVID19:     o Call your primary care provider for questions or health needs.   ? Tell your doctor about your recent travel and your symptoms     o In a medical emergency, call 911 or go to the nearest emergency room.

## 2019-12-11 ENCOUNTER — Encounter: Admit: 2019-12-11 | Discharge: 2019-12-11 | Payer: MEDICAID

## 2019-12-11 NOTE — Telephone Encounter
Order for transfer of care and new mask placed as discussed per office note on 12/08/19    DME: [facility] [place]  Will continue to follow up on order.     Pt has machine

## 2019-12-13 ENCOUNTER — Encounter: Admit: 2019-12-13 | Discharge: 2019-12-13 | Payer: MEDICAID

## 2019-12-13 ENCOUNTER — Ambulatory Visit: Admit: 2019-12-13 | Discharge: 2019-12-14 | Payer: MEDICAID

## 2019-12-13 DIAGNOSIS — G629 Polyneuropathy, unspecified: Secondary | ICD-10-CM

## 2019-12-13 DIAGNOSIS — G4733 Obstructive sleep apnea (adult) (pediatric): Secondary | ICD-10-CM

## 2019-12-13 DIAGNOSIS — E039 Hypothyroidism, unspecified: Secondary | ICD-10-CM

## 2019-12-13 DIAGNOSIS — E119 Type 2 diabetes mellitus without complications: Secondary | ICD-10-CM

## 2019-12-13 DIAGNOSIS — R7989 Other specified abnormal findings of blood chemistry: Secondary | ICD-10-CM

## 2019-12-13 DIAGNOSIS — G4730 Sleep apnea, unspecified: Secondary | ICD-10-CM

## 2019-12-13 DIAGNOSIS — J45909 Unspecified asthma, uncomplicated: Secondary | ICD-10-CM

## 2019-12-13 DIAGNOSIS — J449 Chronic obstructive pulmonary disease, unspecified: Secondary | ICD-10-CM

## 2019-12-13 DIAGNOSIS — I10 Essential (primary) hypertension: Secondary | ICD-10-CM

## 2019-12-13 DIAGNOSIS — E669 Obesity, unspecified: Secondary | ICD-10-CM

## 2019-12-13 DIAGNOSIS — L0291 Cutaneous abscess, unspecified: Secondary | ICD-10-CM

## 2019-12-13 MED ORDER — DIVALPROEX 500 MG PO TB24
ORAL_TABLET | Freq: Every evening | ORAL | 1 refills | 30.00000 days | Status: AC
Start: 2019-12-13 — End: ?

## 2019-12-13 NOTE — Progress Notes
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   Over the past 2 years, he still suffer with obesity, thyroid disease, fatigue, low VitaD, severe low back pain requiring steroids   Low T, DM.      He had a spider bite recently and was incised and packed open. After a week on Clindamycin, he was admitted Oct 11 to Oct 19, for a week and was started on Vancomycin for 2 days. He developed SOB with vancomycin. He was switched to Linezolid 600mg  po bid until about 2 days ago.and antidepressants are held.  He reports he gets these boils frequently on the abdomen.  In 2019 he had to have I&D for one boil.  He is to have bariatric surgery in Jan 2022 and wants to make sure this wound does not delay the surgery.       He is in [facility] about an hour ago. I reviewed records from there. Culture result not available to me.  Griffin Dakin PA-C phone [phone].       Review of Systems  Fatigue, open wound being packed.     Objective:         ? albuterol 0.083% (PROVENTIL) 2.5 mg /3 mL (0.083 %) nebulizer solution Inhale 3 mL solution by nebulizer as directed every 6 hours as needed for Wheezing or Shortness of Breath.   ? albuterol sulfate (PROAIR HFA) 90 mcg/actuation HFA aerosol inhaler INHALE 2 PUFFS BY MOUTH FOUR TIMES DAILY AS NEEDED FOR SHORTNESS OF BREATH   ? aspirin EC 81 mg tablet Take 81 mg by mouth daily. Take with food.   ? carvediloL (COREG) 25 mg tablet TAKE ONE TABLET BY MOUTH TWICE DAILY WITH FOOD   ? citalopram (CELEXA) 40 mg tablet TAKE ONE TABLET BY MOUTH EVERY DAY   ? diclofenac (VOLTAREN) 1 % topical gel Apply four g topically to affected area four times daily. (Patient taking differently: Apply 4 g topically to affected area four times daily as needed.)   ? divalproex (DEPAKOTE ER) 500 mg ER tablet TAKE ONE TABLET BY MOUTH AT BEDTIME *TAKE WITH FOOD*   ? docusate (COLACE) 100 mg capsule Take 100 mg by mouth daily.   ? dulaglutide (TRULICITY) 1.5 mg/0.5 mL injection pen Inject 1.5 mg under the skin every 7 days.   ? famotidine (PEPCID) 20 mg tablet Take 40 mg by mouth daily as needed. Indications: heartburn   ? fluticasone propionate (FLONASE) 50 mcg/actuation nasal spray Apply 1 spray to each nostril as directed twice daily. Shake bottle gently before using.   ? fluticasone propionate (FLOVENT HFA) 110 mcg/actuation inhaler Inhale 2 puffs by mouth into the lungs twice daily.   ? fluticasone-salmeterol (ADVAIR DISKUS) 250-50 mcg inhalation disk Inhale one puff by mouth into the lungs twice daily.   ? gabapentin (NEURONTIN) 800 mg tablet Take 1 tablet by mouth three times daily.   ? glucosamine(+) 500 mg tab Take one tablet by mouth daily.   ? guaiFENesin LA (MUCINEX) 600 mg tablet Take one tablet by mouth twice daily.   ? hydrOXYzine (ATARAX) 25 mg tablet TAKE ONE TABLET BY MOUTH FOUR TIMES DAILY AS NEEDED FOR itching/swelling/anxiety   ? insulin glargine (LANTUS SOLOSTAR) 100 unit/mL (3 mL) injection PEN Inject 60 Units under the skin at bedtime daily.   ? insulin lispro(+) (HUMALOG) 100 unit/mL injection Inject twenty four Units under the skin three times  daily before meals.   ? insulin NPH (NOVOLIN N) 100 unit/mL injection Inject 40 Units under the skin twice daily.   ? insulin regular (HUMULIN R) 100 unit/mL soln injection 18 u tid with meals and sliding scale. (Patient taking differently: Inject 24 Units under the skin three times daily.)   ? ipratropium bromide (ATROVENT) 0.02 % nebulizer solution Inhale 1 vial by mouth into the lungs four times daily as needed for Shortness of Breath or Wheezing.   ? lactobacillus rhamnosus (GG) (CULTURELLE) 10 billion cell cap Take one capsule by mouth daily with breakfast.   ? levothyroxine (SYNTHROID) 50 mcg tablet Take 50 mcg by mouth daily 30 minutes before breakfast.   ? linezolid (ZYVOX) 600 mg tablet Take 600 mg by mouth twice daily.   ? lisinopril (ZESTRIL) 5 mg tablet Take one tablet by mouth daily.   ? LORazepam (ATIVAN) 1 mg tablet TAKE ONE-HALF TO ONE TABLET BY MOUTH TWICE DAILY AS NEEDED   ? melatonin 10 mg tab Take one tablet by mouth at bedtime as needed.   ? metFORMIN-XR (GLUCOPHAGE XR) 500 mg extended release tablet Take 1,000 mg by mouth twice daily with meals.   ? methocarbamoL (ROBAXIN) 750 mg tablet Take 750 mg by mouth twice daily.   ? neomycin-polymyxin-dexameth (MAXITROL) 3.5mg /g-10000unit/g-0.1% ophth oint Apply to right eye as needed   ? neomycin/polymyxin/dexameth (MAXITROL) 3.5mg /mL-10000unit/mL-0.1% ophth susp Apply to right eye as needed   ? omega-3s-dha-epa-fish oil (FISH OIL) 120 mg-180 mg- 60 mg-1,200 mg cpDR Take 1 capsule by mouth daily.   ? oxyCODONE-acetaminophen(+) (PERCOCET) 7.5-325 mg tablet Take 1-2 tablets by mouth every 6 hours as needed   ? pregabalin (LYRICA) 75 mg capsule Take one capsule by mouth daily.   ? sildenafil(+) (VIAGRA) 100 mg tablet Take one tablet by mouth as Needed for Erectile dysfunction. 30-60 min prior to sexual activity   ? simvastatin (ZOCOR) 20 mg tablet Take 20 mg by mouth at bedtime daily.   ? tamsulosin (FLOMAX) 0.4 mg capsule Take one capsule by mouth twice daily. Do not crush, chew or open capsules. Take 30 minutes following the same meal each day.   ? testosterone cypionate (DEPO-TESTOSTERONE) 200 mg/mL injection Inject 200 mg into the muscle every 14 days.   ? tiZANidine (ZANAFLEX) 4 mg tablet Take one tablet by mouth every 6 hours as needed.   ? vitamins, multiple tablet Take 1 tablet by mouth daily.   ? zolpidem (AMBIEN) 5 mg tablet TAKE ONE TABLET BY MOUTH AT BEDTIME AS NEEDED     There were no vitals filed for this visit.  There is no height or weight on file to calculate BMI.     Physical Exam  This is a telehealth visit.        Impression:    Obesity   weight 165.6 kg last checked  DM2  - Last A1c ?  - History of meds non ahderence, can't afford medications.   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   - Reports multiple of these have grown MRSA  - R buttock wound grew MRSA, required I/D, packing, and oral antibiotics; doxycycline    Recent hospitalization [facility]  -October 11 to December 05, 2019  Status post incision and drainage of right buttock abscess  12/01/2019.  Culture unavailable to me.  Initially on clindamycin then changed to linezolid 600 twice daily until 2 days ago  Wound is still open being packed at outside orthopedic clinic  ?  Prostatitis?with prostate abscesses and Enterococcus faecalis (Amp S) bacteremia  - Presents with two weeks of urinary urgency, frequency, dysuria, intermittent hematuria, and decreased urine output, as well as fever (101-103F), chills, night sweats, Nausea, belly pain, and body aches. A/w bowel urgency, frequency, and painful BMs.   - States he has had to strain and push multiple times just to initiate urinary stream, at one point was not able to urinate for 1.5 days d/t pain and amount of effort needed to void.   - Denies penile discharge, or recent new sexual contact other than his fiance. No history of STIs or HIV.   - Reports non-compliance with flomax  - Was seen at hospital in [place] for same problem last week, not started on any Abx, CT A/p there showed elarged prostate.   - Reportedly Blood Cx at OSH NG6D.  - Afebrile, no leukocytosis on presentation, found to have BG >500 started on insulin gtt.   - UA with 1+ blood, neg Nit and LE, 2-10 WBC and RBC, 3+ Glucose. No pyuria.   - Lactic acid 4.6 on presentation   - Received one dose of Zosyn in [place] [facility]  - 05/27/17 Blood Cx Enterococcus faecalis (amp S)  - 05/28/17?Blood?Cx?Enterococcus faecalis?  - CT A/P on 4/12 Prostate mildly enlarged with symmetric low-attenuation, correlation for prostatitis needed. Mild hepatomegaly. ?  - Levaquin on 4/12, ID consulted  - Urine Cx?on 4/16: MRSA (R to Oxa, Tetra, Bactrim), E. Faecalis (susc pending)  -?Chlam/NG PCR?neg  - Prostate enlarged, soft, and tender on gentle DRE.   -?Started Linezolid on 4/12 given recent history of MRSA skin infections. Stopped 4/13.  - Repeat blood cultures obtained 4/16; NGTD  - OR for TURP and abscess unroofing by urology on 4/16. Abx changed to ampicillin.   - MRI C/T/L spine without evidence of vertebral osteo. TEE w/o endocarditis.   - Changed Ampicillin to vancomycin on 4/18 given UC results.?  - 07/08/17 has been off antimicrobials.  - Has follow up with Urology coming up.  ?  AKI  Improved  ?  Prior R ear infection    Reported history of C diff  - No episode in last 6 months  - Pt reports concern for C. Diff now, stool sample seen in room not c/w C. Diff  - Will CTM sx, and if warranted will send sample for C. Diff testing.   ?  H/o bloody bm's and melenic stools  - last 6-12 months ago  - was referred for further workup but never pursued  ?  Depression/Anxiety  -Off antidepressants while on linezolid  ?  HTN  Chronic low back pain  OSA not on CPAP  Pt reported h/o Asthma     Recommendations:  To be able to give the patient advice about duration of antibiotic I would like to examine the wound myself in the ID clinic.  Patient agreed to come in on Friday at 2:30 PM for wound check.  Patient to bring culture results with him if able to otherwise will obtain a culture in the clinic and get the consent faxed to [facility] to obtain culture results    Obtained patient's verbal consent to treat them and their agreement to [facility] financial policy and NPP via this telehealth visit during the  [facility] Emergency. Time 20 min.

## 2019-12-15 ENCOUNTER — Ambulatory Visit: Admit: 2019-12-15 | Discharge: 2019-12-16 | Payer: MEDICARE

## 2019-12-15 ENCOUNTER — Encounter: Admit: 2019-12-15 | Discharge: 2019-12-15 | Payer: MEDICARE

## 2019-12-15 DIAGNOSIS — G4730 Sleep apnea, unspecified: Secondary | ICD-10-CM

## 2019-12-15 DIAGNOSIS — I10 Essential (primary) hypertension: Secondary | ICD-10-CM

## 2019-12-15 DIAGNOSIS — G4733 Obstructive sleep apnea (adult) (pediatric): Secondary | ICD-10-CM

## 2019-12-15 DIAGNOSIS — J449 Chronic obstructive pulmonary disease, unspecified: Secondary | ICD-10-CM

## 2019-12-15 DIAGNOSIS — E669 Obesity, unspecified: Secondary | ICD-10-CM

## 2019-12-15 DIAGNOSIS — E119 Type 2 diabetes mellitus without complications: Secondary | ICD-10-CM

## 2019-12-15 DIAGNOSIS — G629 Polyneuropathy, unspecified: Secondary | ICD-10-CM

## 2019-12-15 DIAGNOSIS — J45909 Unspecified asthma, uncomplicated: Secondary | ICD-10-CM

## 2019-12-15 DIAGNOSIS — E039 Hypothyroidism, unspecified: Secondary | ICD-10-CM

## 2019-12-15 DIAGNOSIS — R7989 Other specified abnormal findings of blood chemistry: Secondary | ICD-10-CM

## 2019-12-16 DIAGNOSIS — L0291 Cutaneous abscess, unspecified: Principal | ICD-10-CM

## 2019-12-20 ENCOUNTER — Encounter: Admit: 2019-12-20 | Discharge: 2019-12-20 | Payer: MEDICARE

## 2019-12-20 ENCOUNTER — Ambulatory Visit: Admit: 2019-12-20 | Discharge: 2019-12-21 | Payer: MEDICARE

## 2019-12-20 DIAGNOSIS — G629 Polyneuropathy, unspecified: Secondary | ICD-10-CM

## 2019-12-20 DIAGNOSIS — F439 Reaction to severe stress, unspecified: Secondary | ICD-10-CM

## 2019-12-20 DIAGNOSIS — E119 Type 2 diabetes mellitus without complications: Secondary | ICD-10-CM

## 2019-12-20 DIAGNOSIS — E039 Hypothyroidism, unspecified: Secondary | ICD-10-CM

## 2019-12-20 DIAGNOSIS — J45909 Unspecified asthma, uncomplicated: Secondary | ICD-10-CM

## 2019-12-20 DIAGNOSIS — R7989 Other specified abnormal findings of blood chemistry: Secondary | ICD-10-CM

## 2019-12-20 DIAGNOSIS — I10 Essential (primary) hypertension: Secondary | ICD-10-CM

## 2019-12-20 DIAGNOSIS — G4730 Sleep apnea, unspecified: Secondary | ICD-10-CM

## 2019-12-20 DIAGNOSIS — E669 Obesity, unspecified: Secondary | ICD-10-CM

## 2019-12-20 DIAGNOSIS — G4733 Obstructive sleep apnea (adult) (pediatric): Secondary | ICD-10-CM

## 2019-12-20 DIAGNOSIS — F331 Major depressive disorder, recurrent, moderate: Secondary | ICD-10-CM

## 2019-12-20 DIAGNOSIS — J449 Chronic obstructive pulmonary disease, unspecified: Secondary | ICD-10-CM

## 2019-12-20 MED ORDER — DIVALPROEX 500 MG PO TB24
500 mg | ORAL_TABLET | Freq: Every evening | ORAL | 0 refills | 30.00000 days | Status: AC
Start: 2019-12-20 — End: ?

## 2019-12-20 MED ORDER — CITALOPRAM 40 MG PO TAB
40 mg | ORAL_TABLET | Freq: Every day | ORAL | 2 refills | Status: AC
Start: 2019-12-20 — End: ?

## 2019-12-20 MED ORDER — DIVALPROEX 250 MG PO TB24
250 mg | ORAL_TABLET | Freq: Every evening | ORAL | 0 refills | 30.00000 days | Status: AC
Start: 2019-12-20 — End: ?

## 2019-12-20 MED ORDER — ZOLPIDEM 5 MG PO TAB
5 mg | ORAL_TABLET | Freq: Every evening | ORAL | 2 refills | 30.00000 days | Status: AC | PRN
Start: 2019-12-20 — End: ?

## 2019-12-20 MED ORDER — LORAZEPAM 1 MG PO TAB
.5-1 mg | ORAL_TABLET | Freq: Two times a day (BID) | ORAL | 2 refills | 12.00000 days | Status: AC | PRN
Start: 2019-12-20 — End: ?

## 2019-12-20 NOTE — Progress Notes
Obtained patient's verbal consent to treat them and their agreement to [facility] financial policy and NPP via this telehealth visit during the [facility] Emergency     ATTENDING NOTE  - TELEHEALTH VISIT     Encounter Date: 12/20/2019  I saw and evaluated Thomas Francis, discussed with Coralyn Mark, MD and concur with the assessment and treatment plan.     PRESENTING PROBLEM AND BACKGROUND: Patient is 49 y.o. male first seen in our clinic Jan 2021 for MDD, trauma history with anxiety, referred by bariatric clinic. Intermittent contact with psychiatry including two hospitalizations 1995 for marital stress. Diagnosed bipolar in past with no clear indication of symptoms but taking divalproex off and on for several years and would like to discontinue. He continues in therapy which he feels is beneficial. Divorced, living by himself, not currently working. Last visit lorazepam added prn for anxiety.    CURRENT TREATMENT AND RESPONSES: Patient reports he was rejected by [facility] and plans to go elsewhere. Mood and anxiety well controlled. He would like to eventually discontinue divalproex because therapist told him he is not bipolar. Recently started c-pap after OSA diagnosis. He agrees with treatment plan.      PLAN:  1. Continue citalopram 40 mg qam, zolpidem 5 mg qhs, and lorazepam 1-2 mg qd prn anxiety  2. Continue divalproex 500 mg qd - consider taper and discontinuing  3. Continue psychotherapy  4. Monitor anxiety, affect, bariatric surgery plans

## 2019-12-20 NOTE — Patient Instructions
It was nice to see you in clinic today.  If questions arise, you can reach the clinic by calling [phone].    Actions from today's visit:  -Citalopram 40 mg nightly for mood and anxiety  -Lorazepam 1 mg twice daily as needed for anxiety  -Depakote ER 500 mg nightly for one month for mood stability    >Ordered Depakote ER 250 mg nightly for one month, then discontinue   -Zolpidem 5 mg nightly for sleep   -Continue receiving TF-CBT from outside therapist.     We will see you back in about 3 months for follow up.    Dr. Crist Fat, MD    Please direct medication refill requests to your pharmacy.    In the event of a safety concern or suicidal thoughts, call 911 or go to the nearest emergency room. National Suicide Prevention Lifeline 604-067-7752 (Talk). Crisis Text Hotline (text 248 793 2816).     The [facility] phone line is a resource for talk support in non-emergency situations (1-866-WARMEAR).

## 2019-12-20 NOTE — Progress Notes
Two patient identifiers confirmed patient agrees to telehealth appointment.

## 2019-12-21 ENCOUNTER — Encounter: Admit: 2019-12-21 | Discharge: 2019-12-21 | Payer: MEDICARE

## 2019-12-21 DIAGNOSIS — Z792 Long term (current) use of antibiotics: Secondary | ICD-10-CM

## 2019-12-21 DIAGNOSIS — L0291 Cutaneous abscess, unspecified: Secondary | ICD-10-CM

## 2019-12-21 MED ORDER — LEVOFLOXACIN 500 MG PO TAB
500 mg | ORAL_TABLET | Freq: Every day | ORAL | 1 refills | 7.00000 days | Status: AC
Start: 2019-12-21 — End: ?

## 2019-12-21 NOTE — Progress Notes
Culture wound with susceptible Pseudomonas.  I called patient and sent him prescription for Levaquin 500mg  daily for 14 days.  Monitor for diarrhea. No history of C diff.  ECG to be done at his PCP office phone [phone]. He is going today.

## 2019-12-25 ENCOUNTER — Encounter: Admit: 2019-12-25 | Discharge: 2019-12-25 | Payer: MEDICARE

## 2019-12-25 NOTE — Telephone Encounter
Received fax from [facility] regarding drug utilization program.   Concern for concomitant use of Levofloxacin and Lispro.    Forwrading to Dr. Colon Flattery for review.

## 2019-12-26 ENCOUNTER — Encounter: Admit: 2019-12-26 | Discharge: 2019-12-26 | Payer: MEDICARE

## 2019-12-26 DIAGNOSIS — Z792 Long term (current) use of antibiotics: Secondary | ICD-10-CM

## 2019-12-26 DIAGNOSIS — L0291 Cutaneous abscess, unspecified: Secondary | ICD-10-CM

## 2019-12-29 ENCOUNTER — Encounter: Admit: 2019-12-29 | Discharge: 2019-12-29 | Payer: MEDICARE

## 2020-01-15 NOTE — Progress Notes
Obtained patient's verbal consent to treat them and their agreement to [facility] financial policy and NPP via this telehealth visit during the [facility]      Subjective:       History of Present Illness  Thomas Francis is a 49 y.o. male.type 2 diabetes (uncontrolled), hypothyroid,hypertension, morbid obesity, COPD, OSA. chronic pain, and severe depression who was seen today as a follow up for type 2 diabetes.  He was last seen on 07/19/19  Interval changes: He had a fall on Friday and has been using  a walker due to severe pain. He just saw Dr. Andreas Newport and was prescribed muscle relaxer and increase dose of oxycodone. He noted significant swelling and was prescribed diuretic which has helped with his urine flow. He was admitted to [facility] for 8 days due to spider bite in Aug 2021, infection did not improve with oral ATB, needed IV vancomycin, then he got very short of breath with fluid and developed CHF.   Bariatric surgery is scheduled in Jan 05/2020, tentative plan for duodenal switch, at [facility] bariatric.    He completed pre-bariatric evaluation including EGD on 01/20/19 which showed a large amount of food residual and will get gastric emptying study scheduled.  He had sleep study and now using CPAP which helped with sleep.    He was admitted for cardiac cath on 07/14/2018 which revealed no significant coronary disease.  An echocardiogram revealed normal LV function, no significant valve disease  He continues on testosterone therapy by Dr. Suan Halter, 200 mg q 2 weeks.  This has helped with his mood but not erectile dysfunction.  He also has significant problem with urinary retention, increased frequency and incontinence, could not get in to see urology until Jan 2022.      Type 2  Diabetes Mellitus with complications, diagnosed since 2010    Most recent HbA1C around 7.6-7.8 few months ago per his report.     PTA regimen:-   Lantus  60 units daily and lispro insulin  24 units TID, metformin XR 1000 mg twice daily     Previous therapy; NPH and regular insulin, regular metformin caused GI issues, Trulicity weekly 1.5 mg or victoza or ozempic depends on what he was given as sample, he did not have much side effects with Trulicity however causes sulfur breath  Frequency of blood sugar check 2-3 times daily reported fasting blood sugar in the 140-180 and similar later the day.   Hypoglycemic episodes on this regimen: had one hypoglycemia during  Diabetic-complications assessment:               Retinopathy: due for exam                Peripheral neuropathy: yes               Autonomic neuropathy: no               Nephropathy: unknown, no recent MACR               Macrovascular complications: no               Hx of DKA - no  On ACEi/ARB?:  Lisinopril 5 mg daily  On Statin?: lipitor 10 mg daily  Exercise - limited due to back pain.   Weight changes weight gain due to poor diet. He lost 20 lbs from last year, weight stable from last visit and he will be getting bariatric surgery.  Hypothyroid: Levothyroxine  50 mcg daily, most recent TSH of 1.02 06/14/18     SH: on disability with nursing background, no smoking or excessive alcohol  Family history: of multiple family members with type 2 diabetes and morbid obesity.  Father had bypass surgery at age 41  PMH: COPD, hypertension, OSA, chronic lower back pain, hypogonadism  Review of Systems  Constitutional:   Fatigue: yes  Pain: yes  Skin:   Heat or cold intolerance : no  Dry skin : no  Flushing episodes : no  Hair loss : no  Excessive body or facial hair: no  Head   Headaches: no  Double or blurry vision : no  Difficulty swallowing or choking : no  Feeling of food stuck: no  Neck enlargement or lumps : no  Head or chest radiation exposure : no  Decreased hearing : no  Chest/Heart   Nipple discharge : no  Chest pain or pressure: no  Heartburn: no  Shortness of breath: no  Heart racing or pounding :yes  Digestion   Abdominal or belly pain : no  Constipation : no  Diarrhea : no  Nausea and vomiting: no  Reflux : no  Lactose intollerance : no  Reproduction   Difficulty with erections: yes  Decreased interest in sex:yes  Urinary   Kidney stones: no   Night time urination : yes  Skeleton   Muscle cramping: yes  Glands   Weight loss : no  Weight gain : no  Nerves   Nervous or anxious : yes  Seizures or falls : no  Dizziness : no  numbenss or tingling :yes  Mental Health   Do you feel sad? :yes  Sleep problems : yes        Objective:         ? albuterol 0.083% (PROVENTIL) 2.5 mg /3 mL (0.083 %) nebulizer solution Inhale 3 mL solution by nebulizer as directed every 6 hours as needed for Wheezing or Shortness of Breath.   ? albuterol sulfate (PROAIR HFA) 90 mcg/actuation HFA aerosol inhaler INHALE 2 PUFFS BY MOUTH FOUR TIMES DAILY AS NEEDED FOR SHORTNESS OF BREATH   ? aspirin EC 81 mg tablet Take 81 mg by mouth daily. Take with food.   ? carvediloL (COREG) 25 mg tablet TAKE ONE TABLET BY MOUTH TWICE DAILY WITH FOOD   ? citalopram (CELEXA) 40 mg tablet Take one tablet by mouth daily. Indications: major depressive disorder   ? diclofenac (VOLTAREN) 1 % topical gel Apply four g topically to affected area four times daily. (Patient taking differently: Apply 4 g topically to affected area four times daily as needed.)   ? divalproex (DEPAKOTE ER) 500 mg ER tablet Take one tablet by mouth at bedtime daily. Take with food.   ? divalproex ER (DEPAKOTE ER) 250 mg tablet Take one tablet by mouth at bedtime daily. Take with food.   ? docusate (COLACE) 100 mg capsule Take 100 mg by mouth daily.   ? dulaglutide (TRULICITY) 1.5 mg/0.5 mL injection pen Inject 1.5 mg under the skin every 7 days.   ? famotidine (PEPCID) 20 mg tablet Take 40 mg by mouth daily as needed. Indications: heartburn   ? fluticasone propionate (FLONASE) 50 mcg/actuation nasal spray Apply 1 spray to each nostril as directed twice daily. Shake bottle gently before using.   ? fluticasone propionate (FLOVENT HFA) 110 mcg/actuation inhaler Inhale 2 puffs by mouth into the lungs twice daily.   ? fluticasone-salmeterol (ADVAIR DISKUS) 250-50 mcg inhalation disk  Inhale one puff by mouth into the lungs twice daily.   ? gabapentin (NEURONTIN) 800 mg tablet Take 1 tablet by mouth three times daily.   ? glucosamine(+) 500 mg tab Take one tablet by mouth daily.   ? guaiFENesin LA (MUCINEX) 600 mg tablet Take one tablet by mouth twice daily.   ? hydrOXYzine (ATARAX) 25 mg tablet TAKE ONE TABLET BY MOUTH FOUR TIMES DAILY AS NEEDED FOR itching/swelling/anxiety   ? insulin glargine (LANTUS SOLOSTAR) 100 unit/mL (3 mL) injection PEN Inject 60 Units under the skin at bedtime daily.   ? insulin lispro(+) (HUMALOG) 100 unit/mL injection Inject twenty four Units under the skin three times daily before meals.   ? insulin NPH (NOVOLIN N) 100 unit/mL injection Inject 40 Units under the skin twice daily.   ? insulin regular (HUMULIN R) 100 unit/mL soln injection 18 u tid with meals and sliding scale. (Patient taking differently: Inject 24 Units under the skin three times daily.)   ? ipratropium bromide (ATROVENT) 0.02 % nebulizer solution Inhale 1 vial by mouth into the lungs four times daily as needed for Shortness of Breath or Wheezing.   ? lactobacillus rhamnosus (GG) (CULTURELLE) 10 billion cell cap Take one capsule by mouth daily with breakfast.   ? levoFLOXacin (LEVAQUIN) 500 mg tablet Take one tablet by mouth daily.   ? levothyroxine (SYNTHROID) 50 mcg tablet Take 50 mcg by mouth daily 30 minutes before breakfast.   ? lisinopril (ZESTRIL) 5 mg tablet Take one tablet by mouth daily.   ? LORazepam (ATIVAN) 1 mg tablet Take one-half tablet to one tablet by mouth twice daily as needed for Other... (anxiety). Indications: anxious   ? melatonin 10 mg tab Take one tablet by mouth at bedtime as needed.   ? metFORMIN-XR (GLUCOPHAGE XR) 500 mg extended release tablet Take 1,000 mg by mouth twice daily with meals.   ? methocarbamoL (ROBAXIN) 750 mg tablet Take 750 mg by mouth twice daily.   ? neomycin-polymyxin-dexameth (MAXITROL) 3.5mg /g-10000unit/g-0.1% ophth oint Apply to right eye as needed   ? neomycin/polymyxin/dexameth (MAXITROL) 3.5mg /mL-10000unit/mL-0.1% ophth susp Apply to right eye as needed   ? omega-3s-dha-epa-fish oil (FISH OIL) 120 mg-180 mg- 60 mg-1,200 mg cpDR Take 1 capsule by mouth daily.   ? oxyCODONE-acetaminophen(+) (PERCOCET) 7.5-325 mg tablet Take 1-2 tablets by mouth every 6 hours as needed   ? pregabalin (LYRICA) 75 mg capsule Take one capsule by mouth daily.   ? sildenafil(+) (VIAGRA) 100 mg tablet Take one tablet by mouth as Needed for Erectile dysfunction. 30-60 min prior to sexual activity   ? simvastatin (ZOCOR) 20 mg tablet Take 20 mg by mouth at bedtime daily.   ? tamsulosin (FLOMAX) 0.4 mg capsule Take one capsule by mouth twice daily. Do not crush, chew or open capsules. Take 30 minutes following the same meal each day.   ? testosterone cypionate (DEPO-TESTOSTERONE) 200 mg/mL injection Inject 200 mg into the muscle every 14 days.   ? tiZANidine (ZANAFLEX) 4 mg tablet Take one tablet by mouth every 6 hours as needed.   ? vitamins, multiple tablet Take 1 tablet by mouth daily.   ? zolpidem (AMBIEN) 5 mg tablet Take one tablet by mouth at bedtime as needed.     There were no vitals filed for this visit.  There is no height or weight on file to calculate BMI.     Physical Exam  General Appearance:morbidly obesed, no distress   Respiratory Effort: breathing comfortably, no respiratory distress  Gait & Station: walks without assistance Orientation: oriented to time, place and person   Affect & Mood: appropriate and sustained affect   Language and Memory: patient responsive and seems to comprehend information   Neurologic Exam: neurological assessment grossly intact   Other: moves all extremities           Results for Thomas Francis, Thomas Francis (MRN [id number]) as of 02/01/2019 17:12   Ref. Range 01/09/2019 12:42   Sodium Latest Ref Range: 137 - 147 MMOL/L 136 (L)   Potassium Latest Ref Range: 3.5 - 5.1 MMOL/L 4.0   Chloride Latest Ref Range: 98 - 110 MMOL/L 101   CO2 Latest Ref Range: 21 - 30 MMOL/L 23   Anion Gap Latest Ref Range: 3 - 12  12   Blood Urea Nitrogen Latest Ref Range: 7 - 25 MG/DL 15   Creatinine Latest Ref Range: 0.4 - 1.24 MG/DL 4.54   eGFR Non African American Latest Ref Range: >60 mL/min >60   eGFR African American Latest Ref Range: >60 mL/min >60   Glucose Latest Ref Range: 70 - 100 MG/DL 098 (H)   Albumin Latest Ref Range: 3.5 - 5.0 G/DL 4.0   Calcium Latest Ref Range: 8.5 - 10.6 MG/DL 9.6   Total Bilirubin Latest Ref Range: 0.3 - 1.2 MG/DL 0.4   Total Protein Latest Ref Range: 6.0 - 8.0 G/DL 7.4   AST (SGOT) Latest Ref Range: 7 - 40 U/L 17   ALT (SGPT) Latest Ref Range: 7 - 56 U/L 12   Alk Phosphatase Latest Ref Range: 25 - 110 U/L 47       Assessment and Plan:  1.Type 2  Diabetes Mellitus with complications, uncontrolled   HgbA1C  reported HbA1C improved to around 7.8 few months ago will contact Dr. Gilles Chiquito office to get his most recent lab as well as order sent for another A1c since it is due.  He is in the process of getting bariatric surgery which should lead to significant improvement in his diabetes.  Continue metformin XR 1000 mg twice daily  Continue Lantus 60 units daily by 2 units twice a week to keep FBS less than 140 without low.   Continue lispro insulin 24 units with meals 3 times daily  Once starts liquid diet, would reduce both insulin in half and add on correction factor II units for every 50 points above 150.Molli Knock to hold off GLP-1 due to possible gastroparesis based on EGD.  Had diabetes education during most recent admission in spring 2019.   2  Diet and life style modifications  3. Hypertension  goal less than 130/80.    4. Lipid  Lipitor 10 mg daily.  5.Diabetes microvascular complications: history of retinopathy and due for an eye exam.  No records of microalbumin over creatinine ratio (due)   6.Hypothyroid: Lt 4 50 mcg daily, most recent TSH of  1.02 on 06/14/18, due for lab if has not been done in the past 6 to 12 months  7.  Erectile dysfunction this is multifactorial including chronic pain, uncontrolled diabetes, hypertension, depression, pain medication.  He was prescribed Viagra which has not helped.  Testosterone therapy is not a primary treatment for erectile dysfunction as well but addressing factors and weight loss could help  8.  Hypogonadism most likely related to multiple factors mentioned above.  He is taking testosterone therapy and will get labs through Dr. Andreas Newport.    9.  Recent fall, he did not think he had fracture  but reports significant pain and swollen.  He had a question whether he could have gout given significant pain and swelling and I would like him to asked Dr. Suan Halter since physical exam is needed to establish a diagnosis.  Return to clinic in 3 months.

## 2020-01-17 ENCOUNTER — Encounter: Admit: 2020-01-17 | Discharge: 2020-01-17 | Payer: MEDICARE

## 2020-01-17 ENCOUNTER — Ambulatory Visit: Admit: 2020-01-17 | Discharge: 2020-01-18 | Payer: MEDICARE

## 2020-01-17 DIAGNOSIS — E291 Testicular hypofunction: Secondary | ICD-10-CM

## 2020-01-17 DIAGNOSIS — G4730 Sleep apnea, unspecified: Secondary | ICD-10-CM

## 2020-01-17 DIAGNOSIS — J45909 Unspecified asthma, uncomplicated: Secondary | ICD-10-CM

## 2020-01-17 DIAGNOSIS — G4733 Obstructive sleep apnea (adult) (pediatric): Secondary | ICD-10-CM

## 2020-01-17 DIAGNOSIS — I10 Essential (primary) hypertension: Secondary | ICD-10-CM

## 2020-01-17 DIAGNOSIS — R7989 Other specified abnormal findings of blood chemistry: Secondary | ICD-10-CM

## 2020-01-17 DIAGNOSIS — G629 Polyneuropathy, unspecified: Secondary | ICD-10-CM

## 2020-01-17 DIAGNOSIS — J449 Chronic obstructive pulmonary disease, unspecified: Secondary | ICD-10-CM

## 2020-01-17 DIAGNOSIS — E039 Hypothyroidism, unspecified: Secondary | ICD-10-CM

## 2020-01-17 DIAGNOSIS — E669 Obesity, unspecified: Secondary | ICD-10-CM

## 2020-01-17 DIAGNOSIS — E119 Type 2 diabetes mellitus without complications: Secondary | ICD-10-CM

## 2020-01-17 MED ORDER — DIVALPROEX 250 MG PO TB24
ORAL_TABLET | Freq: Every evening | 0 refills
Start: 2020-01-17 — End: ?

## 2020-01-17 NOTE — Telephone Encounter
Left message that this is an attempt to start the visit for Dr Karnchanasorn that is due to start at 3:20.  Will call back.  Patient answered and visit started.

## 2020-01-18 DIAGNOSIS — E1165 Type 2 diabetes mellitus with hyperglycemia: Secondary | ICD-10-CM

## 2020-02-16 ENCOUNTER — Encounter: Admit: 2020-02-16 | Discharge: 2020-02-16 | Payer: MEDICARE

## 2020-02-16 MED ORDER — CARVEDILOL 25 MG PO TAB
ORAL_TABLET | Freq: Two times a day (BID) | 0 refills
Start: 2020-02-16 — End: ?

## 2020-03-07 ENCOUNTER — Encounter: Admit: 2020-03-07 | Discharge: 2020-03-07 | Payer: MEDICARE

## 2020-03-07 MED ORDER — CITALOPRAM 40 MG PO TAB
ORAL_TABLET | Freq: Every day | 2 refills | Status: AC
Start: 2020-03-07 — End: ?

## 2020-03-07 MED ORDER — LORAZEPAM 1 MG PO TAB
ORAL_TABLET | Freq: Two times a day (BID) | ORAL | 2 refills | 12.00000 days | Status: AC | PRN
Start: 2020-03-07 — End: ?

## 2020-03-07 MED ORDER — ZOLPIDEM 5 MG PO TAB
5 mg | ORAL_TABLET | Freq: Every evening | ORAL | 2 refills | 30.00000 days | Status: AC | PRN
Start: 2020-03-07 — End: ?

## 2020-03-08 ENCOUNTER — Encounter: Admit: 2020-03-08 | Discharge: 2020-03-08 | Payer: MEDICARE

## 2020-03-13 ENCOUNTER — Encounter: Admit: 2020-03-13 | Discharge: 2020-03-13 | Payer: MEDICARE

## 2020-03-14 ENCOUNTER — Encounter: Admit: 2020-03-14 | Discharge: 2020-03-14 | Payer: MEDICARE

## 2020-03-21 ENCOUNTER — Encounter: Admit: 2020-03-21 | Discharge: 2020-03-21 | Payer: MEDICARE

## 2020-03-21 NOTE — Progress Notes
03/21/2020 10:30 AM     Received drug utilization request from [employer], with regards to pt taking carvedilol and receiving albuterol inhaler treatment. The concerns is listed as "The use of a nonselective beta-blocker in a patient with chronic respiratory disease may increase the risk of bronchospasm and dyspnea and potentially inhibit bronchodilation."    Pt last saw JAF on 06/27/18 and COPD was listed in his current problem list when JAF prescribed medication. JAF aware of pt chronic lung disease and feels that this medication is appropriate for pt. MC

## 2020-03-28 IMAGING — US ABDCM
1 series · 14 of 25 positions shown · non-contrast
Comparison: none

[Series 1: us abdomen complete · 14 of 76 slices shown]
[im 1/76]
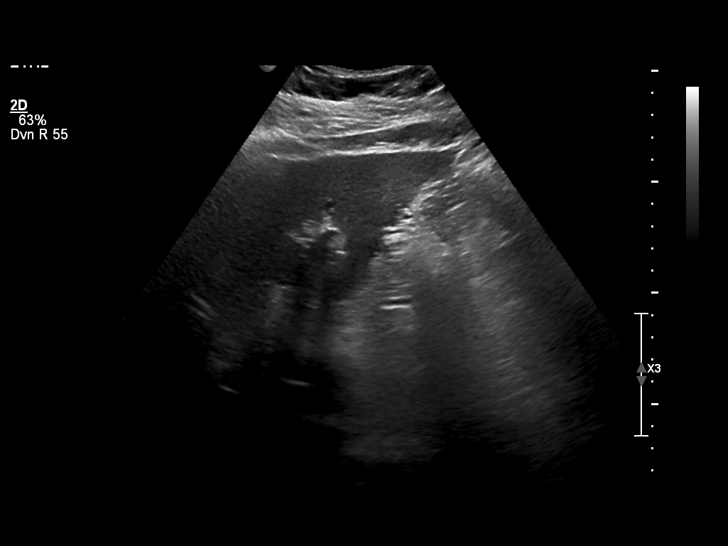
[im 7/76]
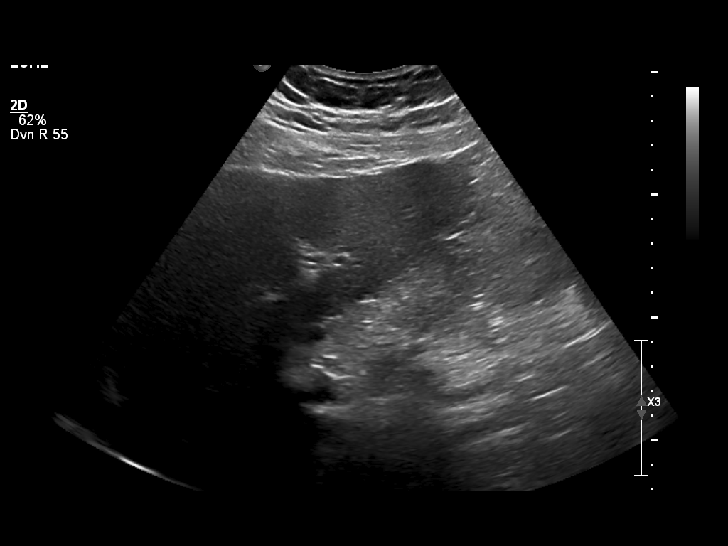
[im 13/76]
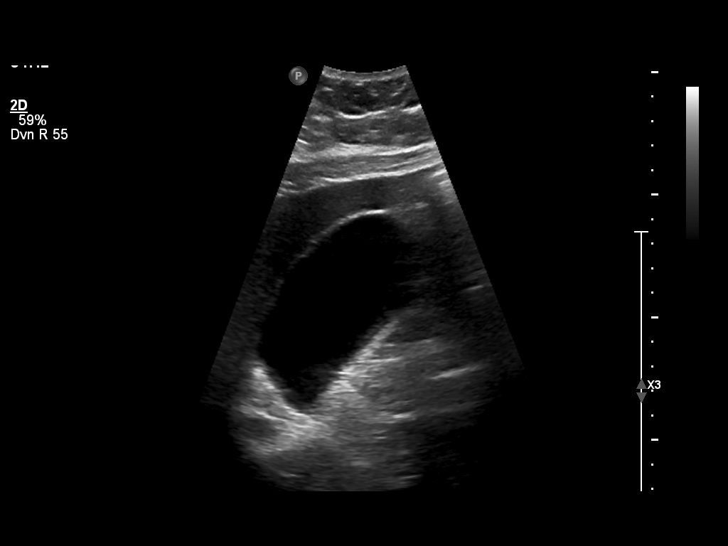
[im 19/76]
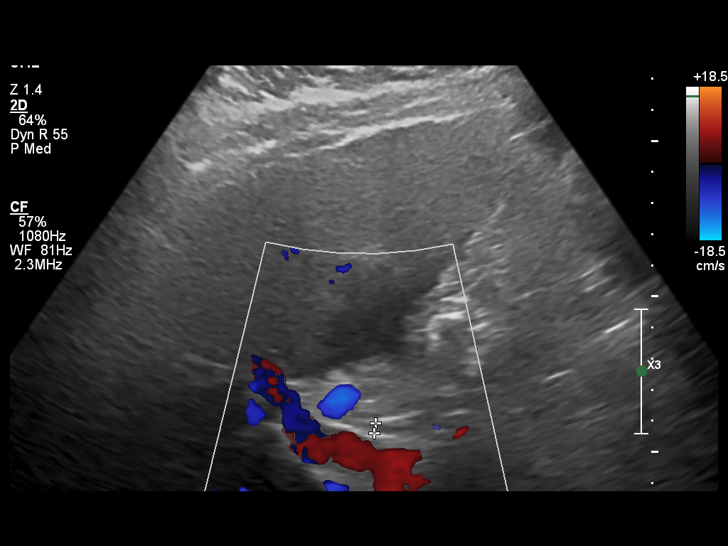
[im 26/76]
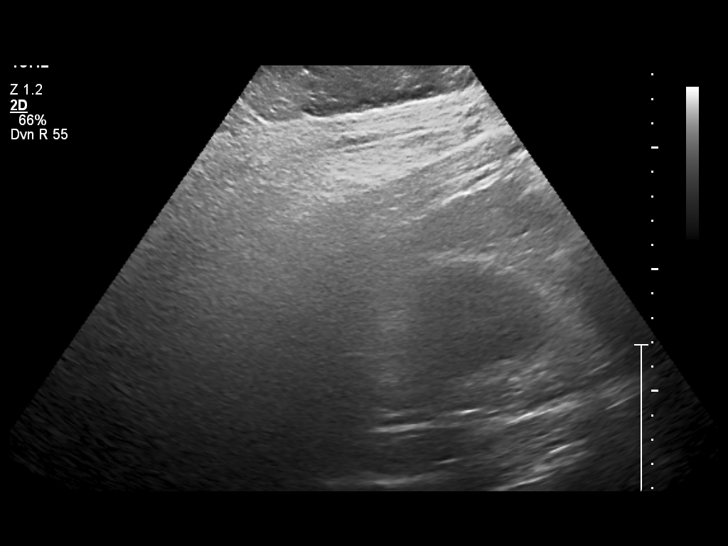
[im 29/76]
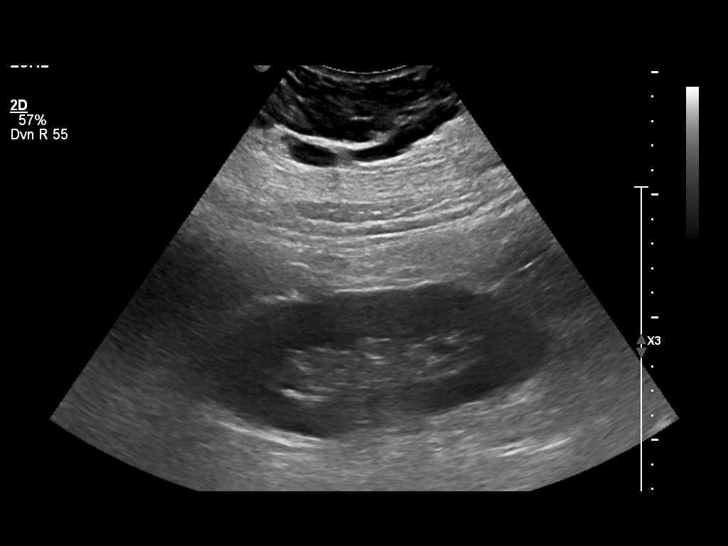
[im 35/76]
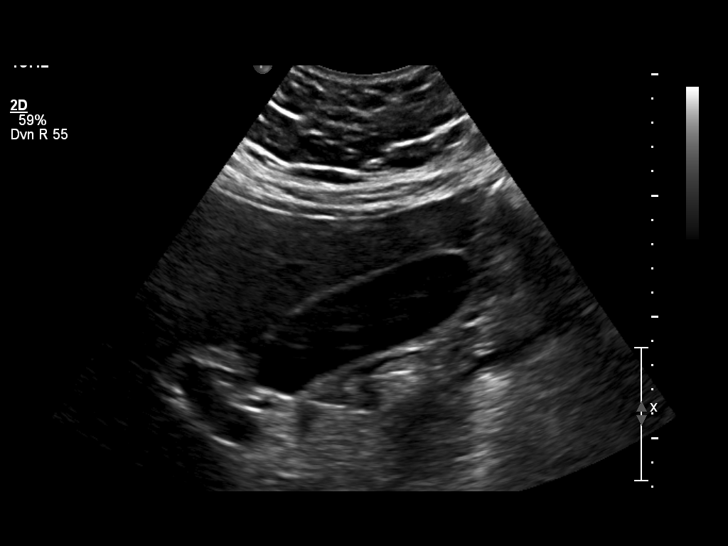
[im 41/76]
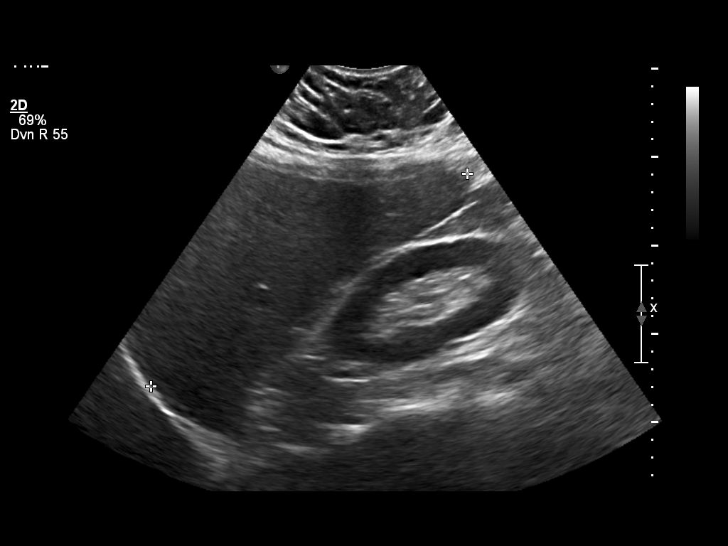
[im 47/76]
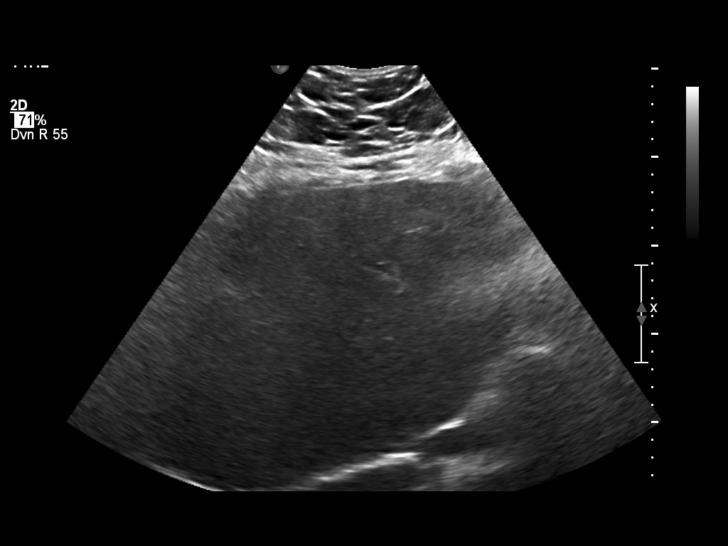
[im 51/76]
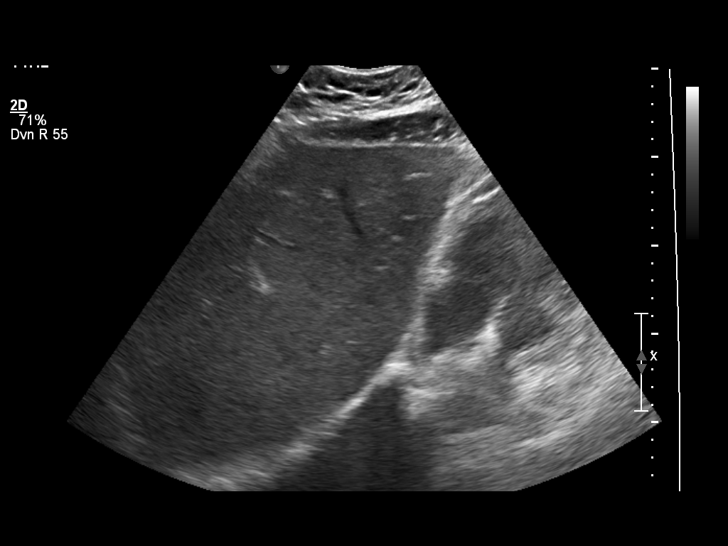
[im 57/76]
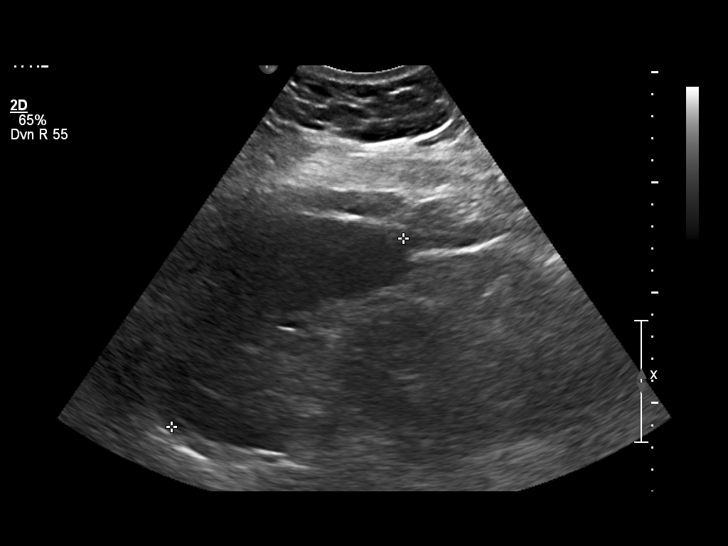
[im 63/76]
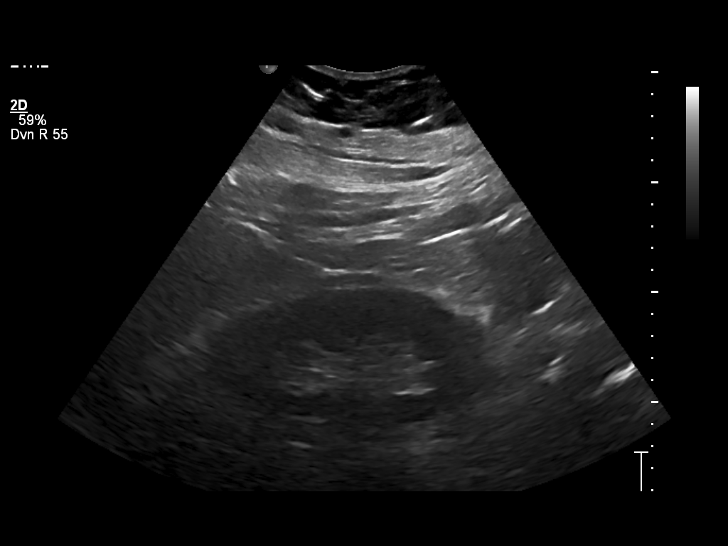
[im 69/76]
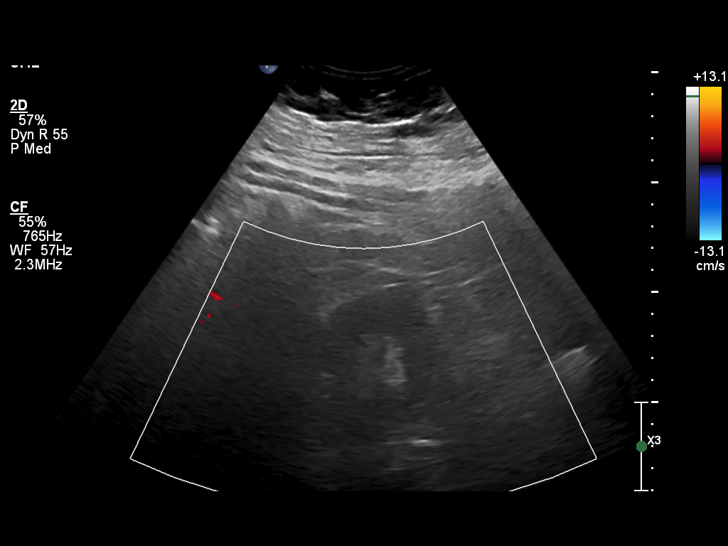
[im 76/76]
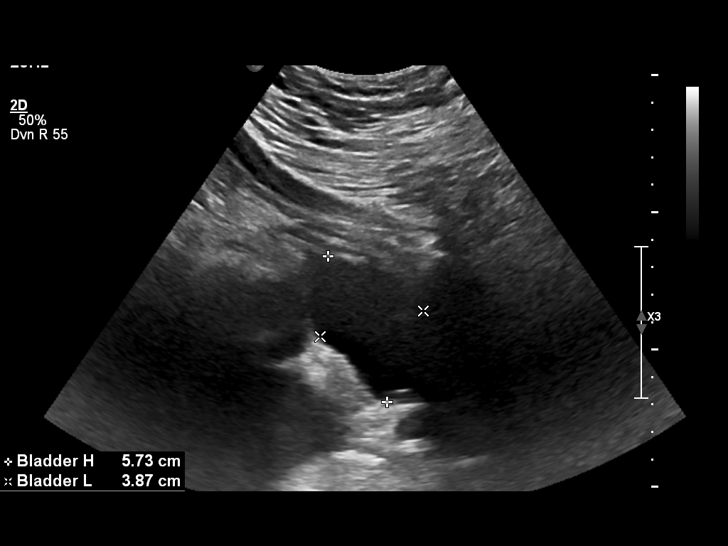

[14 of 25 positions shown; findings below may reference images not displayed]

DIAGNOSTIC STUDIES

EXAM

Abdominal ultrasound

INDICATION

abdomen pain
TB

TECHNIQUE

Standard abdominal ultrasound was obtained.

COMPARISONS

None available

FINDINGS

The visualized aorta and pancreas are within normal limits but largely obscured.

There is diffuse hepatic steatosis with mild hepatomegaly. No focal hepatic abnormalities are
evident. Common bile duct measures 3.2 millimeters.

Gallbladder is normal. Gallbladder wall measures 2.2 millimeters.

Spleen is slightly enlarged at 13.6 cm.

Right kidney measures 15.4 cm in length without hydronephrosis.

Left kidney measures 14.4 cm length without hydronephrosis.

IMPRESSION

Technically limited study. There is hepatic steatosis and mild hepatomegaly. No gallstones are
seen.

Mild splenomegaly.

Tech Notes:

TB

## 2020-04-02 IMAGING — NM NM HIDA
1 series · 6 of 6 positions shown · non-contrast
Comparison: none

[flow · 4.52mm/px · 6 of 60 frames shown]
[frame 6/60]
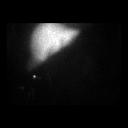
[frame 16/60]
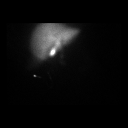
[frame 26/60]
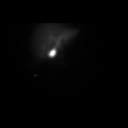
[frame 36/60]
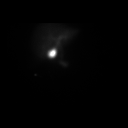
[frame 46/60]
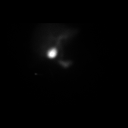
[frame 56/60]
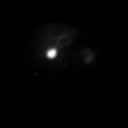

[6 of 6 positions shown; findings below may reference images not displayed]

DIAGNOSTIC STUDIES

EXAM

Nuclear medicine hepatobiliary scan.

INDICATION

abd pain
5.3 mCi HcQQm Mebrofenin @ 8171. Pt given 3.1 micrograms Kinevac over 45 minutes. Pt c/o RUQ pain
and nausea. Pt had recently gastric bypass. ME

TECHNIQUE

Exam was obtained following administration of 5.3 millicuries of Choletec and subsequent
administration of 3.1 micrograms cholecystokinin.

COMPARISONS

None available

FINDINGS

There is normal uptake of radionuclide throughout the liver with visualization of gallbladder at
approximately 15 minutes. Subsequent administration of cholecystokinin demonstrates an ejection
fraction of 15 percent.

IMPRESSION

Diminished gallbladder ejection fraction at 15 percent.

Tech Notes:

## 2020-05-23 ENCOUNTER — Encounter: Admit: 2020-05-23 | Discharge: 2020-05-23 | Payer: MEDICARE

## 2020-05-23 MED ORDER — CARVEDILOL 25 MG PO TAB
ORAL_TABLET | Freq: Two times a day (BID) | 0 refills
Start: 2020-05-23 — End: ?

## 2020-05-29 ENCOUNTER — Encounter: Admit: 2020-05-29 | Discharge: 2020-05-29 | Payer: MEDICARE

## 2020-05-30 ENCOUNTER — Encounter: Admit: 2020-05-30 | Discharge: 2020-05-30 | Payer: MEDICARE

## 2020-05-30 NOTE — Telephone Encounter
-----   Message from Merril Abbe sent at 05/30/2020 10:25 AM CDT -----  Regarding: RE: Overdue OV  LM for pt to call back and sent text message   ----- Message -----  From: Delilah Shan, RN  Sent: 05/29/2020   6:06 PM CDT  To: Merril Abbe  Subject: Overdue OV                                       Hi Steward Drone!     Can you try to contact this pt and get him in to see JAF in one of the work in spots? I sent him a mychart message too!  '  Thank you so much!    Mal

## 2020-06-12 ENCOUNTER — Encounter: Admit: 2020-06-12 | Discharge: 2020-06-12 | Payer: MEDICARE

## 2020-06-12 MED ORDER — ZOLPIDEM 5 MG PO TAB
5 mg | ORAL_TABLET | Freq: Every evening | ORAL | 2 refills | 30.00000 days | Status: AC | PRN
Start: 2020-06-12 — End: ?

## 2020-06-12 MED ORDER — CITALOPRAM 40 MG PO TAB
40 mg | ORAL_TABLET | Freq: Every day | ORAL | 1 refills | Status: AC
Start: 2020-06-12 — End: ?

## 2020-07-05 ENCOUNTER — Encounter: Admit: 2020-07-05 | Discharge: 2020-07-05 | Payer: MEDICARE

## 2020-07-16 ENCOUNTER — Encounter: Admit: 2020-07-16 | Discharge: 2020-07-16 | Payer: MEDICARE

## 2020-07-17 ENCOUNTER — Encounter: Admit: 2020-07-17 | Discharge: 2020-07-17 | Payer: MEDICARE

## 2020-07-17 ENCOUNTER — Ambulatory Visit: Admit: 2020-07-17 | Discharge: 2020-07-18 | Payer: MEDICARE

## 2020-07-17 DIAGNOSIS — E669 Obesity, unspecified: Secondary | ICD-10-CM

## 2020-07-17 DIAGNOSIS — I10 Essential (primary) hypertension: Secondary | ICD-10-CM

## 2020-07-17 DIAGNOSIS — E119 Type 2 diabetes mellitus without complications: Secondary | ICD-10-CM

## 2020-07-17 DIAGNOSIS — J449 Chronic obstructive pulmonary disease, unspecified: Secondary | ICD-10-CM

## 2020-07-17 DIAGNOSIS — G4733 Obstructive sleep apnea (adult) (pediatric): Secondary | ICD-10-CM

## 2020-07-17 DIAGNOSIS — R7989 Other specified abnormal findings of blood chemistry: Secondary | ICD-10-CM

## 2020-07-17 DIAGNOSIS — J45909 Unspecified asthma, uncomplicated: Secondary | ICD-10-CM

## 2020-07-17 DIAGNOSIS — E039 Hypothyroidism, unspecified: Secondary | ICD-10-CM

## 2020-07-17 DIAGNOSIS — G629 Polyneuropathy, unspecified: Secondary | ICD-10-CM

## 2020-07-17 DIAGNOSIS — N3281 Overactive bladder: Secondary | ICD-10-CM

## 2020-07-17 DIAGNOSIS — G4730 Sleep apnea, unspecified: Secondary | ICD-10-CM

## 2020-07-17 LAB — URINALYSIS DIPSTICK
GLUCOSE,UA: NEGATIVE
NITRITE: NEGATIVE
URINE ASCORBIC ACID, UA: NEGATIVE
URINE BILE: NEGATIVE
URINE BLOOD: NEGATIVE
URINE PH: 5 (ref 5.0–8.0)
URINE SPEC GRAVITY: 1 (ref 1.005–1.030)

## 2020-07-17 LAB — URINALYSIS, MICROSCOPIC

## 2020-07-17 NOTE — Patient Instructions
Having a Urodynamics Study     The equipment used for the study varies depending upon the facility and what tests are done.   Urodynamic studies are tests that evaluate the bladder, sphincters, and urethra and their ability to store and release urine. They?may be done in?your healthcare provider?s office, a clinic, or a hospital. The studies may take up to an hour or longer. This depends on which tests you have. The tests are generally painless. A small tube (catheter) will be placed into your bladder and in your rectum. You won?t need sedating medicine.  Tests that may be done  ? Uroflowmetry. This measures the amount and speed of urine you void from your bladder. You urinate into a funnel. It?s attached to a computer that records your urine flow over time. The amount of urine left in your bladder after you urinate may also be measured right after this test.  ? Cystometry. This test evaluates how much?your bladder can hold. It also measures how strong?your bladder muscle is. And how well the signals work that tell you when your bladder is full.?Your healthcare provider fills your bladder with sterile water or saline solution, through a catheter.?Your provider will instruct you to report any sensations you feel. Mention if they?re similar to symptoms you?ve felt at home. Your?provider may ask you to?cough, stand and walk, or bear down during this test.  ? Electromyogram. This helps evaluate the muscle contractions that control urination, such as sphincter muscle contractions. Your healthcare provider may put electrode patches or wires near?your rectum or urethra to make the recording.?He or she?may ask you?to try to tighten or relax your sphincter muscles during this test.  ? Pressure flow study. This test measures?your detrusor, urethral, and abdominal pressures. Detrusor is the muscle around the bladder walls. It relaxes to let?your bladder fill. And it contracts to squeeze out urine.?A pressure flow study?is often done after cystometry. You?re asked to urinate while a probe in your urethra measures pressures.  ? Video cystourethrography. This takes video pictures of urine flow through your urinary tract. It can help find blockages or other problems. The bladder is filled with an X-ray contrast fluid. Then X-ray video pictures are taken as the fluid is urinated out.?Ultrasound imaging may also be combined with routine urodynamic studies.  ? Ambulatory urodynamics. This test can be used to evaluate you while doing normal activities.    Getting your results  After the study, you?ll get dressed and return to the consultation room. Test results may be ready soon after the study is finished. Or you may go back to your healthcare provider?s office in a few days for your results. You can talk with your provider about the study report and your treatment options.  StayWell last reviewed this educational content on 07/17/2017  ? 2000-2021 [employer], [place]. All rights reserved. This information is not intended as a substitute for professional medical care. Always follow your healthcare professional's instructions.          Understanding Urodynamics Studies  Urodynamics studies are a series of tests that give your healthcare provider a close look at the working of your bladder, urethra, and pelvic muscles. The tests can help your provider learn about any problems you have storing urine or peeing.  Understanding the lower urinary tract  The lower part of the urinary tract has several parts.  ? The bladder stores urine until you?re ready to release it.  ? The urethra is the?tube that carries urine from the  bladder out of the body.  ? The sphincter is made up of muscles around the opening of the bladder. The sphincter muscles tighten to hold urine in the bladder. They relax to let urine flow. Signals from the brain tell the sphincter when to tighten and relax. These signals also tell the bladder when to contract to let urine flow out of the body.     The bladder holds urine until it leaves the body through the urethra.     Why you need a urodynamics study  You may need this test if you:  ? Leak urine (are incontinent)  ? Have a bladder that doesn't empty all the way.  ? Have symptoms such as the need to pee often or a constant strong need to pee  ? Have a urine stream that's weak or that stops and starts (intermittent)  ? Have persistent urinary tract infections  Getting ready for the study  ? Tell your healthcare provider about any medicines you?re taking. This includes prescription and over-the-counter medicines. It also includes any vitamins, herbs, or other supplements. Ask if you should stop them before the study.  ? Keep a diary of your bathroom habits. Do this for a few days before the study. This diary can be a helpful part of the evaluation.  ? Ask if you need to arrive for the study with a full bladder.    StayWell last reviewed this educational content on 12/18/2019    ? 2000-2022 [employer], [place]. All rights reserved. This information is not intended as a substitute for professional medical care. Always follow your healthcare professional's instructions.        Cystoscopy  Cystoscopy is a procedure that lets your healthcare provider look directly inside your urethra and bladder. It can be used to:  ? Help diagnose a problem with your urethra, bladder, or kidneys.  ? Take a sample (biopsy) of bladder or urethral tissue.  ? Treat certain problems (such as removing kidney stones).  ? Place a tube (stent) to bypass a blockage.  ? Take special X-rays of the kidneys.  Based on the findings, your provider may advise other tests or treatments.  What is a cystoscope?  A cystoscope is a telescope-like tube that contains lenses and small glass wires that make bright light (fiberoptics). The cystoscope may be straight and rigid. Or it may be flexible to bend around curves in the urethra. The healthcare provider may look directly into the cystoscope, or project the image onto a screen.    Getting ready  ? Ask your healthcare provider if you should stop taking any medicines before the procedure.  ? Follow any directions you're given for not eating or drinking before the procedure.  ? Follow any other instructions your healthcare provider gives you.    Tell your?healthcare provider before the exam if you:  ? Take any medicines, such as aspirin or blood thinners. This also includes any over-the-counter and prescription medicines, vitamins, herbs, and other supplements.  ? Have allergies to any medicines  ? Are pregnant or think you may be pregnant    During the procedure  Cystoscopy is done in the healthcare provider?s office, surgery center, or hospital. The doctor and a nurse are present during the procedure. It takes only a few minutes. It takes longer if a biopsy, X-ray, or treatment needs to be done.  During the procedure:  ? You lie on an exam table on your back, knees bent and legs apart.  You're covered with a drape.  ? Your urethra and the area around it are washed. Anesthetic jelly may be applied to numb the urethra. Other pain medicine is often not needed. In some cases, you may be offered a mild sedative to help you relax. If a more extensive procedure is to be done, such as a biopsy or kidney stone removal, general anesthesia may be needed. Often a one-time antibiotic is given.  ? The cystoscope is inserted. A sterile fluid is put into the bladder to expand it. You may feel pressure from this fluid.  ? When the procedure is done, the cystoscope is removed.  After the procedure  If you had a sedative, general anesthesia, or spinal anesthesia, you must have someone drive you home. Once you?re home:  ? Drink plenty of fluids.  ? You may have burning or light bleeding when you pee. This is normal.  ? Medicines may be prescribed to ease any mild pain or prevent infection. Take these as directed.  When to call your healthcare provider  Call your healthcare provider if you have any of the following after the procedure:  ? Heavy bleeding or blood clots  ? Burning that lasts more than 1 day  ? Fever of ? 100? F? ( 38?C ) or higher, or as directed by your provider  ? Trouble peeing  ?  StayWell last reviewed this educational content on 11/17/2019  ? 2000-2021 [employer], [place]. All rights reserved. This information is not intended as a substitute for professional medical care. Always follow your healthcare professional's instructions.

## 2020-07-18 DIAGNOSIS — R399 Unspecified symptoms and signs involving the genitourinary system: Secondary | ICD-10-CM

## 2020-07-18 DIAGNOSIS — N179 Acute kidney failure, unspecified: Secondary | ICD-10-CM

## 2020-08-18 ENCOUNTER — Encounter: Admit: 2020-08-18 | Discharge: 2020-08-18 | Payer: MEDICARE

## 2020-08-18 MED ORDER — CITALOPRAM 40 MG PO TAB
ORAL_TABLET | Freq: Every day | 1 refills
Start: 2020-08-18 — End: ?

## 2020-08-18 MED ORDER — ZOLPIDEM 5 MG PO TAB
5 mg | ORAL_TABLET | Freq: Every evening | ORAL | 2 refills | PRN
Start: 2020-08-18 — End: ?

## 2020-08-18 MED ORDER — CARVEDILOL 25 MG PO TAB
ORAL_TABLET | Freq: Two times a day (BID) | 0 refills
Start: 2020-08-18 — End: ?

## 2020-08-28 ENCOUNTER — Encounter: Admit: 2020-08-28 | Discharge: 2020-08-28 | Payer: MEDICARE

## 2020-08-28 MED ORDER — ZOLPIDEM 5 MG PO TAB
5 mg | ORAL_TABLET | Freq: Every evening | ORAL | 0 refills | 30.00000 days | Status: AC | PRN
Start: 2020-08-28 — End: ?

## 2020-08-28 NOTE — Telephone Encounter
I will order one time fill of zolpidem 5mg  po qhs prn. Patient has an upcoming appointment on 10/09/20. Since this medication is written PRN I expect this refill to last through his next appointment. K-TRACS reviewed and he is also prescribed lorazepam and oxycodone.     No further refills will be authorized. Patient must maintain follow up for ongoing medication management. Prescribing controlled substances requires 3 month follow up, and his last office visit is documented 12/20/19.

## 2020-09-04 ENCOUNTER — Encounter: Admit: 2020-09-04 | Discharge: 2020-09-04 | Payer: MEDICARE

## 2020-09-04 ENCOUNTER — Ambulatory Visit: Admit: 2020-09-04 | Discharge: 2020-09-05 | Payer: MEDICARE

## 2020-09-04 DIAGNOSIS — F1120 Opioid dependence, uncomplicated: Secondary | ICD-10-CM

## 2020-09-04 DIAGNOSIS — F331 Major depressive disorder, recurrent, moderate: Secondary | ICD-10-CM

## 2020-09-04 DIAGNOSIS — F1320 Sedative, hypnotic or anxiolytic dependence, uncomplicated: Secondary | ICD-10-CM

## 2020-09-04 DIAGNOSIS — F439 Reaction to severe stress, unspecified: Secondary | ICD-10-CM

## 2020-09-04 DIAGNOSIS — G4733 Obstructive sleep apnea (adult) (pediatric): Secondary | ICD-10-CM

## 2020-09-04 MED ORDER — NALOXONE 4 MG/ACTUATION NA SPRY
4 mg | Freq: Once | NASAL | 0 refills | 1.00000 days | Status: AC
Start: 2020-09-04 — End: ?

## 2020-09-04 MED ORDER — LORAZEPAM 1 MG PO TAB
ORAL_TABLET | Freq: Two times a day (BID) | ORAL | 0 refills | 12.00000 days | Status: AC | PRN
Start: 2020-09-04 — End: ?

## 2020-09-04 MED ORDER — ZOLPIDEM 5 MG PO TAB
5 mg | ORAL_TABLET | Freq: Every evening | ORAL | 0 refills | 30.00000 days | Status: AC | PRN
Start: 2020-09-04 — End: ?

## 2020-09-04 MED ORDER — ZOLPIDEM 5 MG PO TAB
5 mg | ORAL_TABLET | Freq: Every evening | ORAL | 0 refills | 30.00000 days | Status: AC
Start: 2020-09-04 — End: ?

## 2020-09-04 MED ORDER — HYDROXYZINE HCL 25 MG PO TAB
25 mg | ORAL_TABLET | Freq: Four times a day (QID) | ORAL | 2 refills | 30.00000 days | Status: AC | PRN
Start: 2020-09-04 — End: ?

## 2020-09-04 MED ORDER — DIVALPROEX 500 MG PO TB24
500 mg | ORAL_TABLET | Freq: Every evening | ORAL | 2 refills | 30.00000 days | Status: AC
Start: 2020-09-04 — End: ?

## 2020-09-04 NOTE — Progress Notes
Pt verified name and DOB and gives verbal consent to treat through telehealth appointment today. Pt did not have any vitals to report for today's visit.

## 2020-09-04 NOTE — Progress Notes
Obtained patient's verbal consent to treat them and their agreement to [facility] financial policy and NPP via this telehealth visit during the [facility] Emergency     ATTENDING NOTE  - TELEHEALTH VISIT     Encounter Date: 09/04/2020  I saw and evaluated Thomas Francis, discussed with Leticia Clas, DO and concur with the assessment and treatment plan.     PRESENTING PROBLEM AND BACKGROUND: Patient is 50 y.o. male first seen in our clinic Jan 2021 for MDD, trauma history with anxiety, referred by bariatric clinic (rejected by bariatric clinic for surgery). Intermittent contact with psychiatry including two hospitalizations 1995 for marital stress. Diagnosed bipolar in past with no clear indication of symptoms but taking divalproex off and on for several years and discontinued last visit. Divorced, living by himself, not currently working.     CURRENT TREATMENT AND RESPONSES: Patient reports he has been more dysphoric since his nephew committed suicide last month. More panic attacks, Sleep disrupted. He thinks he did better on divalproex and would like to resume. Continues in therapy which he feels is beneficial. Uncertain if he has started using C-PAP after OSA diagnosed 6 months ago. He agrees with treatment plan.    He agrees with treatment plan.      PLAN:  1. Continue citalopram 40 mg qam, zolpidem 5 mg qhs, and lorazepam 1-2 mg qd prn anxiety  2. Resume divalproex 500 mg qd   3. Is he using C-PAP?  4. Continue psychotherapy  5. Monitor anxiety, affect, bariatric surgery plans

## 2020-09-04 NOTE — Progress Notes
Telehealth Visit Note    Date of Service: 09/04/2020    Subjective:      Obtained patient's verbal consent to treat them and their agreement to [facility] financial policy and NPP via this telehealth visit during the [facility] Emergency       Thomas Francis is a 50 y.o. male.    Thomas Francis is a 50 year old male who presents today for follow up. He follows with [facility] for major depressive disorder, generalized anxiety disorder and self-reported history of bipolar disorder with no clear history of mania who presents today via telehealth for follow up. He was last seen via telehealth by Dr. Katharine Look 12/20/2019. He was continued on citalopram 40mg  po qday, lorazepam 1mg  po bid prn, zolpidem 5mg  po qhs. He was previously on depakote ER 500mg  po qhs but this was tapered and discontinued at last appointment per patient request.     Patient reports low mood since March of this year due to an unexpected death in the family. He reports his nephew died by suicide. He and his nephew were close in age and his death was very unexpected. He reports depressed mood for 3-4 days at a time during which he will isolate and not feel like doing much.     He reports that he has significant mood swings. He feels that depakote was previously efficacious for these symptoms.     Sleep- tough- reports some trouble getting to sleep, but other times where he will sleep more throughout the day; he will occasionally take 10mg  of zolpidem if he is unable to sleep     He is using lorazepam when he is getting really distraught where he is having panic attacks where he will almost throw up. He has been using coping skills that he learned through therapy. He states that he has been using lorazepam more often lately, about three times per day. He feels that he is struggling with getting stuck on getting fixated with things.     Patient has been continuing with therapy. He denies SI/HI/AVH. He shares that he never wants to be dependent on medication for a crutch. He is interested in resuming depakote because he felt this medication was previously effective.              Review of Systems   Psychiatric/Behavioral: Positive for sleep disturbance. Negative for hallucinations and suicidal ideas. The patient is not nervous/anxious.          Objective:         ? albuterol 0.083% (PROVENTIL) 2.5 mg /3 mL (0.083 %) nebulizer solution Inhale 3 mL solution by nebulizer as directed every 6 hours as needed for Wheezing or Shortness of Breath.   ? albuterol sulfate (PROAIR HFA) 90 mcg/actuation HFA aerosol inhaler INHALE 2 PUFFS BY MOUTH FOUR TIMES DAILY AS NEEDED FOR SHORTNESS OF BREATH   ? aspirin EC 81 mg tablet Take 81 mg by mouth daily. Take with food.   ? carvediloL (COREG) 25 mg tablet TAKE ONE TABLET BY MOUTH TWICE DAILY WITH FOOD   ? cholecalciferol (vitamin D3) (OPTIMAL D3) 50,000 units capsule TAKE ONE CAPSULE BY MOUTH WEEKLY ON FRIDAY   ? citalopram (CELEXA) 40 mg tablet Take one tablet by mouth daily. Refill x1. Must schedule f/u: [phone]   ? diclofenac (VOLTAREN) 1 % topical gel Apply four g topically to affected area four times daily. (Patient taking differently: Apply 4 g topically to affected area four times daily as needed.)   ?  divalproex (DEPAKOTE ER) 500 mg ER tablet Take one tablet by mouth at bedtime daily. Take with food.   ? divalproex ER (DEPAKOTE ER) 250 mg tablet Take one tablet by mouth at bedtime daily. Take with food.   ? docusate (COLACE) 100 mg capsule Take 100 mg by mouth daily.   ? dulaglutide (TRULICITY) 1.5 mg/0.5 mL injection pen Inject 1.5 mg under the skin every 7 days.   ? famotidine (PEPCID) 20 mg tablet Take 40 mg by mouth daily as needed. Indications: heartburn   ? fluticasone propionate (FLONASE) 50 mcg/actuation nasal spray Apply 1 spray to each nostril as directed twice daily. Shake bottle gently before using.   ? fluticasone propionate (FLOVENT HFA) 110 mcg/actuation inhaler Inhale 2 puffs by mouth into the lungs twice daily.   ? fluticasone-salmeterol (ADVAIR DISKUS) 250-50 mcg inhalation disk Inhale one puff by mouth into the lungs twice daily.   ? furosemide (LASIX) 40 mg tablet Take 40 mg by mouth every morning.   ? gabapentin (NEURONTIN) 800 mg tablet Take 1 tablet by mouth three times daily.   ? glucosamine(+) 500 mg tab Take one tablet by mouth daily.   ? guaiFENesin LA (MUCINEX) 600 mg tablet Take one tablet by mouth twice daily.   ? hydrOXYzine (ATARAX) 25 mg tablet TAKE ONE TABLET BY MOUTH FOUR TIMES DAILY AS NEEDED FOR itching/swelling/anxiety   ? insulin glargine (LANTUS SOLOSTAR) 100 unit/mL (3 mL) injection PEN Inject 60 Units under the skin at bedtime daily.   ? insulin lispro(+) (HUMALOG) 100 unit/mL injection Inject twenty four Units under the skin three times daily before meals.   ? insulin NPH (NOVOLIN N) 100 unit/mL injection Inject 40 Units under the skin twice daily.   ? insulin regular (HUMULIN R) 100 unit/mL soln injection 18 u tid with meals and sliding scale. (Patient taking differently: Inject 24 Units under the skin three times daily.)   ? ipratropium bromide (ATROVENT) 0.02 % nebulizer solution Inhale 1 vial by mouth into the lungs four times daily as needed for Shortness of Breath or Wheezing.   ? lactobacillus rhamnosus (GG) (CULTURELLE) 10 billion cell cap Take one capsule by mouth daily with breakfast.   ? levoFLOXacin (LEVAQUIN) 500 mg tablet Take one tablet by mouth daily.   ? levothyroxine (SYNTHROID) 50 mcg tablet Take 50 mcg by mouth daily 30 minutes before breakfast.   ? lisinopril (ZESTRIL) 5 mg tablet Take one tablet by mouth daily.   ? LORazepam (ATIVAN) 1 mg tablet TAKE ONE-HALF TO ONE TABLET BY MOUTH TWICE DAILY AS NEEDED   ? melatonin 10 mg tab Take one tablet by mouth at bedtime as needed.   ? meloxicam (MOBIC) 15 mg tablet Take 15 mg by mouth daily.   ? metFORMIN-XR (GLUCOPHAGE XR) 500 mg extended release tablet Take 1,000 mg by mouth twice daily with meals.   ? methocarbamoL (ROBAXIN) 750 mg tablet Take 750 mg by mouth twice daily.   ? MYRBETRIQ 50 mg ER tablet Take 50 mg by mouth daily.   ? neomycin-polymyxin-dexameth (MAXITROL) 3.5mg /g-10000unit/g-0.1% ophth oint Apply to right eye as needed   ? neomycin/polymyxin/dexameth (MAXITROL) 3.5mg /mL-10000unit/mL-0.1% ophth susp Apply to right eye as needed   ? omega-3s-dha-epa-fish oil (FISH OIL) 120 mg-180 mg- 60 mg-1,200 mg cpDR Take 1 capsule by mouth daily.   ? oxyCODONE-acetaminophen(+) (PERCOCET) 7.5-325 mg tablet Take 1-2 tablets by mouth every 6 hours as needed   ? pregabalin (LYRICA) 75 mg capsule Take one capsule by mouth daily.   ?  sildenafil(+) (VIAGRA) 100 mg tablet Take one tablet by mouth as Needed for Erectile dysfunction. 30-60 min prior to sexual activity   ? simvastatin (ZOCOR) 20 mg tablet Take 20 mg by mouth at bedtime daily.   ? tamsulosin (FLOMAX) 0.4 mg capsule Take one capsule by mouth twice daily. Do not crush, chew or open capsules. Take 30 minutes following the same meal each day.   ? testosterone cypionate (DEPO-TESTOSTERONE) 200 mg/mL injection Inject 200 mg into the muscle every 14 days.   ? tiZANidine (ZANAFLEX) 4 mg tablet Take one tablet by mouth every 6 hours as needed.   ? vitamins, multiple tablet Take 1 tablet by mouth daily.   ? zolpidem (AMBIEN) 5 mg tablet TAKE ONE TABLET BY MOUTH AT BEDTIME AS NEEDED          Telehealth Patient Reported Vitals     Row Name 09/04/20 1513                Pain Score SIX        Pain Loc GENERALIZED                  Computed Telehealth Body Mass Index unavailable. Necessary lab results were not found in the last year.    Physical Exam  Psychiatric:      Comments: Mental Status Exam:  General/Constitutional:?dressed in personal attire, appears stated age  Behavior: sitting calmly, cooperative with assessment   Speech/Motor:?rate, rhythm and volume wnl, good articulation, no evidence of PMA/PMR  Eye Contact:?good  Mood: good  Affect:?mood congruent, euthymic, does not appear overtly dysthymic    Thought Process:?linear, logical, goal oriented  Thought Content:?denies SI/HI/AVH, no evidence of delusions  Perception:?does not appear to be responding to internal stimuli  Insight/Judgment:?fair/fair  ?  Orientation:?alert and awake   Recent and Remote Memory:?grossly intact, though not formally assessed  Attention span and concentration:?appears average, though not formally assessed  Language:?fluent?in english?  Fund of knowledge and vocabulary:?appears average?                  Assessment and Plan:  Mr. Chizmar reports worsening dysphoria and worsening mood swings since the unexpected death of his nephew in May 01, 2022 of this past year. He felt that some of his symptoms were under better control with depakote and is interested in resuming this medication. He reports that he typically takes 5mg  of zolpidem but occasionally will use 10mg  on nights that he has particularly poor sleep; he shares that this is something he has done for quite some time. He is also using lorazepam up to three times daily.     He verbalizes that he does not want to be dependent on medications and states that his long term goal is to eventually minimize polypharmacy. PDMP reviewed, patient also prescribed oxycodone-acetaminophen 7.5-325 and gabapentin. Reviewed policy for regular follow up for ongoing medication management for controlled substances. Historical review indicates that previous provider prescribed 5mg  zolpidem and lorazepam 1mg  po bid. Patient advised that this regimen will be continued, and no dose increases are indicated. Reviewed risks of combining opiates and benzodiazepines with additional sedative medications. We will discuss plan to minimize polypharmacy and potential taper plan at next visit. He verbalized his understanding.     We will resume depakote ER 500mg  po qhs for mood stabilization per patient request. Advised that he should anticipate improvement of sleep with this medication as well given its sedating properties.       DSM-5 Diagnoses:  - Moderate  episode of recurrent MDD  - Other trauma and stressor related disorder  - Benzodiazepine dependence  - Opioid dependence     Psychotropic medications:  - Continue citalopram 40mg  po qday   - Continue lorazepam 1mg  po bid prn anxiety   - Continue zolpidem 5mg  po qhs prn sleep   - Continue hydroxyzine 25mg  po qid prn anxiety   - Restart depakote ER 500mg  po qhs     Labs:  Lab Results   Component Value Date    CHOL 216 (H) 01/09/2019     Lab Results   Component Value Date    LDL 99 01/09/2019     No results found for: LIPOPROTA  Lab Results   Component Value Date    HDL 46 01/09/2019     Lab Results   Component Value Date    NONHDLCHOL 170 01/09/2019     Lab Results   Component Value Date    TRIG 671 (H) 01/09/2019       Lab Results   Component Value Date/Time    HGBA1C 8.8 (H) 06/25/2017 06:00 AM       Patient seen and plan of care discussed with Dr. Alphonsus Sias    Return to clinic in 3 months for follow up     Patient Instructions   It was a pleasure to see you in clinic today 09/04/2020.     Medications:  - Continue citalopram 40mg  by mouth everyday  - Continue lorazepam 1mg  by mouth twice per day as needed  - Continue zolpidem 5mg  by mouth at night for sleep   - Restart depakote ER 500mg  by mouth at night     As a reminder, ongoing medication management for controlled substances requires 3 month follow up.      Long term goals of minimizing medications will be part of our discussion at next follow up.     Return to clinic in 3 months or sooner for follow up.    [facility] Phone: [phone]    If you or someone you know is in immediate danger please call 911    If you are not in immediate danger but need someone to talk with about your feelings please call one of the following national suicide prevention lines:   [phone]  [phone]  Or text HOME to [phone]                      30 minutes spent on this patient's encounter with counseling and coordination of care taking >50% of the visit.

## 2020-09-04 NOTE — Patient Instructions
It was a pleasure to see you in clinic today 09/04/2020.     Medications:  - Continue citalopram 40mg  by mouth everyday  - Continue lorazepam 1mg  by mouth twice per day as needed  - Continue zolpidem 5mg  by mouth at night for sleep   - Restart depakote ER 500mg  by mouth at night     As a reminder, ongoing medication management for controlled substances requires 3 month follow up.      Long term goals of minimizing medications will be part of our discussion at next follow up.     Return to clinic in 3 months or sooner for follow up.    [facility] Phone: [phone]    If you or someone you know is in immediate danger please call 911    If you are not in immediate danger but need someone to talk with about your feelings please call one of the following national suicide prevention lines:   [phone]  [phone]  Or text HOME to [phone]

## 2020-09-16 NOTE — Progress Notes
Obtained patient's, or patient proxy's, verbal consent to treat them and their agreement to [facility] financial policy and NPP via this telehealth visit during the [facility]    Subjective:       History of Present Illness  Thomas Francis is a 50 y.o. male here today for management of type 2 diabetes.  Past medical history significant for hypertension, hypothyroidism, CKD, neuropathy, COPD.  Last seen in this clinic in 01/2020 by Dr. Lajoyce Corners.  This is his initial visit with me.    Interval history:   He had bariatric surgery in 02/2020.  Since that time blood sugars have significantly improved.  Right after bariatric surgery he restarted his metformin.  6 weeks after bariatric surgery he restarted his Humalog.  Never restarted Lantus.    He is currently taking metformin 1000 mg daily and Humalog about 12 units once or twice a day.  Typically checking his blood sugar once or twice a day.  Blood sugars ranging between 140 and 170.  Fasting blood sugar this morning 167.  Having some low blood sugars intermittently, at alternating times a day.    Today he is interested in starting a GLP-1 RA again.  Previously been diagnosed with gastroparesis but symptoms have improved since his bariatric surgery.  Also interested in getting a libre.    He is lost about 80 pounds since his bariatric surgery and continues to lose weight.  He is trying to exercise intermittently to help with this.  He is eating 2-3 small meals a day.    He reports a history of neuropathy, last foot exam was in fall 2021 with PCP.  Reports recent shortness of breath due to a sinus infection.      Last A1c: 7.9 in 10/2019   fasting blood sugar: 167     DM:   Type: 2  Current treatment: metformin, humalog  Past treatment: trulicity   Medication adherence: not taking humalog three times a day   SMBG: 1-2 times a day   Hyperglycemia: mild fasting, reports global   Hypoglycemia: rare  Meals per day: 2-3 small meals a day   CHO intake:not limiting   Exercise: few times weekly   DM related hospitalizations: 3 years ago DKA     Complications of DM:  CAD: no   CVA: no   PVD: no   Amputations: no   Retinopathy:no - has  glaucoma, cataract surgery   Gastropathy: yes   Nephropathy: yes   Neuropathy: yes     Medications:  Statin: yes  ACE-I: yes  ASA: yes    Last eye exam: 02/2020         Review of Systems   Constitutional: Positive for appetite change. Negative for unexpected weight change.   HENT: Positive for sinus pain.    Eyes: Negative for visual disturbance.   Respiratory: Positive for shortness of breath.    Cardiovascular: Negative for chest pain and palpitations.   Gastrointestinal: Negative for diarrhea and nausea.   Endocrine: Positive for heat intolerance. Negative for cold intolerance.   Musculoskeletal: Positive for arthralgias and back pain.   Neurological: Positive for numbness.         Objective:         ? albuterol 0.083% (PROVENTIL) 2.5 mg /3 mL (0.083 %) nebulizer solution Inhale 3 mL solution by nebulizer as directed every 6 hours as needed for Wheezing or Shortness of Breath.   ? albuterol sulfate (PROAIR HFA) 90 mcg/actuation HFA aerosol  inhaler INHALE 2 PUFFS BY MOUTH FOUR TIMES DAILY AS NEEDED FOR SHORTNESS OF BREATH   ? aspirin EC 81 mg tablet Take 81 mg by mouth daily. Take with food.   ? carvediloL (COREG) 25 mg tablet TAKE ONE TABLET BY MOUTH TWICE DAILY WITH FOOD   ? cholecalciferol (vitamin D3) (OPTIMAL D3) 50,000 units capsule TAKE ONE CAPSULE BY MOUTH WEEKLY ON FRIDAY   ? citalopram (CELEXA) 40 mg tablet Take one tablet by mouth daily. Refill x1. Must schedule f/u: [phone]   ? diclofenac (VOLTAREN) 1 % topical gel Apply four g topically to affected area four times daily. (Patient taking differently: Apply 4 g topically to affected area four times daily as needed.)   ? divalproex (DEPAKOTE ER) 500 mg ER tablet Take one tablet by mouth at bedtime daily. Take with food.   ? docusate (COLACE) 100 mg capsule Take 100 mg by mouth daily.   ? famotidine (PEPCID) 20 mg tablet Take 40 mg by mouth daily as needed. Indications: heartburn   ? fluticasone propionate (FLONASE) 50 mcg/actuation nasal spray Apply 1 spray to each nostril as directed twice daily. Shake bottle gently before using.   ? fluticasone propionate (FLOVENT HFA) 110 mcg/actuation inhaler Inhale 2 puffs by mouth into the lungs twice daily.   ? fluticasone-salmeterol (ADVAIR DISKUS) 250-50 mcg inhalation disk Inhale one puff by mouth into the lungs twice daily.   ? gabapentin (NEURONTIN) 800 mg tablet Take 1 tablet by mouth three times daily.   ? glucosamine(+) 500 mg tab Take one tablet by mouth daily.   ? guaiFENesin LA (MUCINEX) 600 mg tablet Take one tablet by mouth twice daily.   ? hydrOXYzine HCL (ATARAX) 25 mg tablet Take one tablet by mouth four times a day while awake as needed for Anxiety. TAKE ONE TABLET BY MOUTH FOUR TIMES DAILY AS NEEDED FOR itching/swelling/anxiety   ? insulin glargine (LANTUS SOLOSTAR) 100 unit/mL (3 mL) injection PEN Inject 60 Units under the skin at bedtime daily.   ? insulin lispro(+) (HUMALOG) 100 unit/mL injection Inject twenty four Units under the skin three times daily before meals.   ? ipratropium bromide (ATROVENT) 0.02 % nebulizer solution Inhale 1 vial by mouth into the lungs four times daily as needed for Shortness of Breath or Wheezing.   ? lactobacillus rhamnosus (GG) (CULTURELLE) 10 billion cell cap Take one capsule by mouth daily with breakfast.   ? levoFLOXacin (LEVAQUIN) 500 mg tablet Take one tablet by mouth daily.   ? levothyroxine (SYNTHROID) 50 mcg tablet Take 50 mcg by mouth daily 30 minutes before breakfast.   ? lisinopril (ZESTRIL) 5 mg tablet Take one tablet by mouth daily.   ? LORazepam (ATIVAN) 1 mg tablet Take one half to one tablet by mouth twice daily as needed for severe anxiety that is not responsive to hydroxyzine.   ? [START ON 10/02/2020] LORazepam (ATIVAN) 1 mg tablet Take one half to one tablet twice daily as needed for severe anxiety that is not responsive to hydroxyzine.   ? [START ON 10/30/2020] LORazepam (ATIVAN) 1 mg tablet Take one half to one tablet twice daily as needed for severe anxiety that is not responsive to hydroxyzine.   ? meloxicam (MOBIC) 15 mg tablet Take 15 mg by mouth daily.   ? metFORMIN-XR (GLUCOPHAGE XR) 500 mg extended release tablet Take 1,000 mg by mouth twice daily with meals.   ? omega-3s-dha-epa-fish oil (FISH OIL) 120 mg-180 mg- 60 mg-1,200 mg cpDR Take 1 capsule by mouth  daily.   ? oxyCODONE-acetaminophen(+) (PERCOCET) 7.5-325 mg tablet Take 1-2 tablets by mouth every 6 hours as needed   ? sildenafil(+) (VIAGRA) 100 mg tablet Take one tablet by mouth as Needed for Erectile dysfunction. 30-60 min prior to sexual activity   ? simvastatin (ZOCOR) 20 mg tablet Take 20 mg by mouth at bedtime daily.   ? tamsulosin (FLOMAX) 0.4 mg capsule Take one capsule by mouth twice daily. Do not crush, chew or open capsules. Take 30 minutes following the same meal each day.   ? testosterone cypionate (DEPO-TESTOSTERONE) 200 mg/mL injection Inject 200 mg into the muscle every 14 days.   ? tiZANidine (ZANAFLEX) 4 mg tablet Take one tablet by mouth every 6 hours as needed.   ? vitamins, multiple tablet Take 1 tablet by mouth daily.   ? zolpidem (AMBIEN) 5 mg tablet Take one tablet by mouth at bedtime as needed for Sleep.   ? [START ON 10/02/2020] zolpidem (AMBIEN) 5 mg tablet Take one tablet by mouth at bedtime daily.   ? [START ON 10/30/2020] zolpidem (AMBIEN) 5 mg tablet Take one tablet by mouth at bedtime daily.     There were no vitals filed for this visit.  There is no height or weight on file to calculate BMI.           Wt Readings from Last 3 Encounters:   07/17/20 (!) 149.2 kg (329 lb)   12/15/19 (!) 165.6 kg (365 lb)   07/05/19 (!) 165.6 kg (365 lb)        Physical Exam  Vitals and nursing note reviewed.   Constitutional:       Appearance: Normal appearance. He is obese.   HENT:      Head: Normocephalic and atraumatic.      Nose: Nose normal.   Eyes:      Extraocular Movements: Extraocular movements intact.      Conjunctiva/sclera: Conjunctivae normal.   Pulmonary:      Effort: Pulmonary effort is normal.   Musculoskeletal:      Cervical back: Normal range of motion.   Neurological:      Mental Status: He is alert and oriented to person, place, and time.   Psychiatric:         Mood and Affect: Mood normal.         Behavior: Behavior normal.         Thought Content: Thought content normal.         Judgment: Judgment normal.                       Comprehensive Metabolic Profile    Lab Results   Component Value Date/Time    NA 136 (L) 01/09/2019 12:42 PM    K 4.0 01/09/2019 12:42 PM    CL 101 01/09/2019 12:42 PM    CO2 23 01/09/2019 12:42 PM    GAP 12 01/09/2019 12:42 PM    BUN 15 01/09/2019 12:42 PM    CR 0.91 01/09/2019 12:42 PM    GLU 160 (H) 01/09/2019 12:42 PM    Lab Results   Component Value Date/Time    CA 9.6 01/09/2019 12:42 PM    PO4 5.3 (H) 06/24/2017 06:25 AM    ALBUMIN 4.0 01/09/2019 12:42 PM    TOTPROT 7.4 01/09/2019 12:42 PM    ALKPHOS 47 01/09/2019 12:42 PM    AST 17 01/09/2019 12:42 PM    ALT 12 01/09/2019 12:42 PM    TOTBILI  0.4 01/09/2019 12:42 PM    GFR >60 01/09/2019 12:42 PM    GFRAA >60 01/09/2019 12:42 PM        Lab Results   Component Value Date    CHOL 216 (H) 01/09/2019    TRIG 671 (H) 01/09/2019    HDL 46 01/09/2019    LDL 99 01/09/2019    VLDL 161 01/09/2019    NONHDLCHOL 170 01/09/2019       TSH   Date Value Ref Range Status   06/14/2018 1.02  Final       No results found for: MCALB24   No results found for: URMALBCRRAT  No results found for: MCALBR    Hemoglobin A1C   Date Value Ref Range Status   06/25/2017 8.8 (H) 4.0 - 6.0 % Final     Comment:     The ADA recommends that most patients with type 1 and type 2 diabetes maintain   an A1c level <7%.     05/30/2017 11.3 (H) 4.0 - 6.0 % Final     Comment:     The ADA recommends that most patients with type 1 and type 2 diabetes maintain   an A1c level <7%.       Poc Hemoglobin A1C   Date Value Ref Range Status   12/10/2017 8.2 % Final          Assessment and Plan:    Diabetes mellitus type 2,  Uncontrolled    -A1c 7.9% in 10/2019 - Not At Goal    -Target A1c <7% without excessive hypoglycemia   - Currently on metformin, MDI    - Complicated by CKD, Neuropathy, Gastroparesis   - Assessment of glycemic control: moderate due to self reported blood sugars   Plan:  ? We will hold off making any changes until I can review most recent labs done this year  ? Continue metformin 1000 mg once daily -we will hold off increasing dose until after reviewing GFR  ? Briefly discussed trialing Trulicity again.  Okay with this although spent a long time with patient reviewing side effects of this medication.  There is a strong likelihood that he will not be able to tolerate it or that his gastroparesis symptoms may worsen.  ? If able to start and tolerate Trulicity plan to discontinue Humalog  ? We will try for libre CGM, [facility]      Diabetic complications - prevention and management:  -Electrolytes and renal function: Last done 10/2019  -Eye exam: Last done 02/2020  Retinopathy - no   -Foot exam / monofilament exam : Last done fall 2021. Will perform at next in person visit  Discussed foot care.   -Diabetic Educator and/or nutritionist visit: not discussed     Hypothyroidism   - TSH 3.95 in 10/2019   - reports heat intolerance otherwise euthyroid     HTN:   -Goal BP <130/80.    -Urine microalbumin/Cr: CKD ACE/ARB?: lisinopril     Lipids   -Lipid panel: requested from PCP   -Statin: simvastatin       Plan of care discussed with patient and patient is agreeable.      RTC 3 mo          Total Time Today was 30 minutes in the following activities: Preparing to see the patient, Obtaining and/or reviewing separately obtained history, Counseling and educating the patient/family/caregiver and Ordering medications, tests, or procedures

## 2020-09-17 ENCOUNTER — Encounter: Admit: 2020-09-17 | Discharge: 2020-09-17 | Payer: MEDICARE

## 2020-09-17 ENCOUNTER — Ambulatory Visit: Admit: 2020-09-17 | Discharge: 2020-09-18 | Payer: MEDICARE

## 2020-09-17 DIAGNOSIS — E039 Hypothyroidism, unspecified: Secondary | ICD-10-CM

## 2020-09-17 DIAGNOSIS — E119 Type 2 diabetes mellitus without complications: Secondary | ICD-10-CM

## 2020-09-17 DIAGNOSIS — I10 Essential (primary) hypertension: Secondary | ICD-10-CM

## 2020-09-17 DIAGNOSIS — J449 Chronic obstructive pulmonary disease, unspecified: Secondary | ICD-10-CM

## 2020-09-17 DIAGNOSIS — G4733 Obstructive sleep apnea (adult) (pediatric): Secondary | ICD-10-CM

## 2020-09-17 DIAGNOSIS — E1165 Type 2 diabetes mellitus with hyperglycemia: Secondary | ICD-10-CM

## 2020-09-17 DIAGNOSIS — J45909 Unspecified asthma, uncomplicated: Secondary | ICD-10-CM

## 2020-09-17 DIAGNOSIS — R7989 Other specified abnormal findings of blood chemistry: Secondary | ICD-10-CM

## 2020-09-17 DIAGNOSIS — G4730 Sleep apnea, unspecified: Secondary | ICD-10-CM

## 2020-09-17 DIAGNOSIS — E669 Obesity, unspecified: Secondary | ICD-10-CM

## 2020-09-17 DIAGNOSIS — G629 Polyneuropathy, unspecified: Secondary | ICD-10-CM

## 2020-09-17 MED ORDER — FREESTYLE LIBRE 2 SENSOR MISC KIT
1 | Freq: Before meals | 3 refills | Status: AC
Start: 2020-09-17 — End: ?

## 2020-09-17 MED ORDER — CITALOPRAM 40 MG PO TAB
ORAL_TABLET | Freq: Every day | 0 refills | Status: AC
Start: 2020-09-17 — End: ?

## 2020-09-17 NOTE — Progress Notes
Attempted call at 10:43 AM LVM   Obtained patient's verbal consent to treat them and their agreement to [facility] financial policy and NPP via this telehealth visit during the [facility]

## 2020-09-20 ENCOUNTER — Encounter: Admit: 2020-09-20 | Discharge: 2020-09-20 | Payer: MEDICARE

## 2020-09-20 NOTE — Telephone Encounter
RN received incoming fax from [employer] lab with lab results.  Given to [employer] to review.    Robley Fries, RN

## 2020-10-09 ENCOUNTER — Encounter: Admit: 2020-10-09 | Discharge: 2020-10-09 | Payer: MEDICARE

## 2020-10-15 ENCOUNTER — Encounter: Admit: 2020-10-15 | Discharge: 2020-10-15 | Payer: MEDICARE

## 2020-10-15 NOTE — Progress Notes
STAT RECORDS REQUEST     Please send the following records for continuation of care:     Thomas Francis. Amorin  05-03-1970     Office Notes  EKG's with tracings   Recent Cardiac Testing  Any Cardiac-related records  Recent lab  Cardiac Procedures  H&P/Discharge Summary  Operative Reports- Cardiac     Please fax to:  Desmond Lope [phone]     [facility]  Cardiovascular Medicine

## 2020-10-19 ENCOUNTER — Encounter: Admit: 2020-10-19 | Discharge: 2020-10-19 | Payer: MEDICARE

## 2020-10-19 MED ORDER — CITALOPRAM 40 MG PO TAB
ORAL_TABLET | Freq: Every day | 0 refills
Start: 2020-10-19 — End: ?

## 2020-10-23 ENCOUNTER — Encounter: Admit: 2020-10-23 | Discharge: 2020-10-23 | Payer: MEDICARE

## 2020-10-23 ENCOUNTER — Ambulatory Visit: Admit: 2020-10-23 | Discharge: 2020-10-23 | Payer: MEDICARE

## 2020-10-23 DIAGNOSIS — I4510 Unspecified right bundle-branch block: Secondary | ICD-10-CM

## 2020-10-23 DIAGNOSIS — R7989 Other specified abnormal findings of blood chemistry: Secondary | ICD-10-CM

## 2020-10-23 DIAGNOSIS — G4733 Obstructive sleep apnea (adult) (pediatric): Secondary | ICD-10-CM

## 2020-10-23 DIAGNOSIS — G4730 Sleep apnea, unspecified: Secondary | ICD-10-CM

## 2020-10-23 DIAGNOSIS — N1831 Stage 3a chronic kidney disease (HCC): Secondary | ICD-10-CM

## 2020-10-23 DIAGNOSIS — G629 Polyneuropathy, unspecified: Secondary | ICD-10-CM

## 2020-10-23 DIAGNOSIS — E669 Obesity, unspecified: Secondary | ICD-10-CM

## 2020-10-23 DIAGNOSIS — J45909 Unspecified asthma, uncomplicated: Secondary | ICD-10-CM

## 2020-10-23 DIAGNOSIS — I10 Essential (primary) hypertension: Secondary | ICD-10-CM

## 2020-10-23 DIAGNOSIS — J449 Chronic obstructive pulmonary disease, unspecified: Secondary | ICD-10-CM

## 2020-10-23 DIAGNOSIS — E1165 Type 2 diabetes mellitus with hyperglycemia: Secondary | ICD-10-CM

## 2020-10-23 DIAGNOSIS — E119 Type 2 diabetes mellitus without complications: Secondary | ICD-10-CM

## 2020-10-23 DIAGNOSIS — E039 Hypothyroidism, unspecified: Secondary | ICD-10-CM

## 2020-10-23 NOTE — Patient Instructions
Your EKG shows a new finding, right bundle branch block.  This is probably of no clinical consequence.  I am arranging for a echocardiogram to make sure no right ventricle dysfunction    Your exam is normal    We have made no adjustment in your current medications    Thomas Gaudy PA will see you in 6 months.  I will see you in 12 months    We will call you with echo results at completion    I work with a wonderful group of nurses that are part of the "purple" team.  There is a shared phone number for this group of nurses, [phone].  Please contact this number should you have any questions about your visit today or concerns about upcoming appointments/procedures.  We would like to know if you are having any worrisome cardiac symptoms.     Please call [phone] if needing to schedule an office visit.     We are also available by e-mail through the MyChart system if you have non-urgent concerns. We will get back to you as soon as possible. For urgent or after hours needs, please call [phone].

## 2020-10-28 NOTE — Progress Notes
Date of Service: 10/29/2020     Subjective:             Thomas Francis is a 50 y.o. male    History of Present Illness    Patient seen on 07/17/20 for erectile dysfunction. Multiple treatment options were discussed including PDE 5 inhibitors, Muse, vacuum erection device, penile injections, penile implant.  At that time patient chose to pursue penile injections.  Patient presents today for ICI teaching.    Medical History:   Diagnosis Date   ? Asthma    ? COPD (chronic obstructive pulmonary disease) (HCC)    ? DM (diabetes mellitus) (HCC)    ? Hypertension    ? Hypothyroid    ? Low testosterone    ? Morbid obesity with BMI of 50.0-59.9, adult (HCC) 06/17/2017   ? 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 Obesity    ? Obstructive sleep apnea    ? Sleep apnea        Surgical History:   Procedure Laterality Date   ? ABSCESS DRAINAGE Right 12/2016    [facility] right buttock cheek.   ? TRANSURETHRAL DRAINAGE ABSCESS - PROSTATE N/A 06/01/2017    Performed by Eveline Keto, MD at [facility] OR   ? ANGIOGRAPHY CORONARY ARTERY WITH LEFT HEART CATHETERIZATION N/A 07/14/2018    Performed by Myriam Jacobson, MD at [facility] CATH LAB   ? POSSIBLE PERCUTANEOUS CORONARY STENT PLACEMENT WITH ANGIOPLASTY N/A 07/14/2018    Performed by Myriam Jacobson, MD at [facility] CATH LAB   ? ESOPHAGOGASTRODUODENOSCOPY WITH SPECIMEN COLLECTION BY BRUSHING/ WASHING N/A 01/20/2019    Performed by Meyer Cory, MD at [facility] ENDO   ? ESOPHAGOGASTRODUODENOSCOPY WITH BIOPSY - FLEXIBLE N/A 01/20/2019    Performed by Meyer Cory, MD at [facility] ENDO   ? TONSILLECTOMY         Family History   Problem Relation Age of Onset   ? None Reported Mother    ? Heart Disease Father        Current Outpatient Medications   Medication Sig Dispense Refill   ? albuterol 0.083% (PROVENTIL) 2.5 mg /3 mL (0.083 %) nebulizer solution Inhale 3 mL solution by nebulizer as directed every 6 hours as needed for Wheezing or Shortness of Breath.     ? albuterol sulfate (PROAIR HFA) 90 mcg/actuation HFA aerosol inhaler INHALE 2 PUFFS BY MOUTH FOUR TIMES DAILY AS NEEDED FOR SHORTNESS OF BREATH     ? alprostadil 20 mcg/mL soln IC inj(cmpd) Inject five mcg into base of penis as directed as Needed. Max 1 dose/ 24-48 hrs. 10 mL 11   ? aspirin EC 81 mg tablet Take 81 mg by mouth daily. Take with food.     ? carvediloL (COREG) 25 mg tablet TAKE ONE TABLET BY MOUTH TWICE DAILY WITH FOOD 180 tablet 3   ? cholecalciferol (vitamin D3) (OPTIMAL D3) 50,000 units capsule TAKE ONE CAPSULE BY MOUTH WEEKLY ON FRIDAY     ? citalopram (CELEXA) 40 mg tablet Take one tablet by mouth daily. Please contact clinic to make an upcoming visit [phone]. Thank you 30 tablet 0   ? diclofenac (VOLTAREN) 1 % topical gel Apply four g topically to affected area four times daily. (Patient taking differently: Apply 4 g topically to affected area four times daily as needed.) 300 g 3   ? divalproex (DEPAKOTE ER) 500 mg ER tablet Take one tablet by mouth at bedtime daily. Take with food. 30  tablet 2   ? docusate (COLACE) 100 mg capsule Take 100 mg by mouth daily.     ? empagliflozin (JARDIANCE) 10 mg tablet Take one tablet by mouth daily. 90 tablet 3   ? famotidine (PEPCID) 20 mg tablet Take 40 mg by mouth daily as needed. Indications: heartburn     ? fluticasone propionate (FLONASE) 50 mcg/actuation nasal spray Apply 1 spray to each nostril as directed twice daily. Shake bottle gently before using.     ? fluticasone propionate (FLOVENT HFA) 110 mcg/actuation inhaler Inhale 2 puffs by mouth into the lungs twice daily.     ? fluticasone-salmeterol (ADVAIR DISKUS) 250-50 mcg inhalation disk Inhale one puff by mouth into the lungs twice daily. (Patient taking differently: Inhale 1 puff by mouth into the lungs as Needed.) 3 Inhaler 3   ? FREESTYLE LIBRE 2 SENSOR sensor Use one each as directed before meals and at bedtime. Use as directed.  Indications: type 2 diabetes mellitus 6 each 3   ? gabapentin (NEURONTIN) 800 mg tablet Take 1 tablet by mouth three times daily.     ? guaiFENesin LA (MUCINEX) 600 mg tablet Take one tablet by mouth twice daily. 180 tablet 0   ? hydrOXYzine HCL (ATARAX) 25 mg tablet Take one tablet by mouth four times a day while awake as needed for Anxiety. TAKE ONE TABLET BY MOUTH FOUR TIMES DAILY AS NEEDED FOR itching/swelling/anxiety 120 tablet 2   ? insulin lispro(+) (HUMALOG) 100 unit/mL injection Inject twenty four Units under the skin three times daily before meals. 3 vial 11   ? ipratropium bromide (ATROVENT) 0.02 % nebulizer solution Inhale 1 vial by mouth into the lungs four times daily as needed for Shortness of Breath or Wheezing.     ? lactobacillus rhamnosus (GG) (CULTURELLE) 10 billion cell cap Take one capsule by mouth daily with breakfast. 90 capsule 0   ? levoFLOXacin (LEVAQUIN) 500 mg tablet Take one tablet by mouth daily. (Patient taking differently: Take 500 mg by mouth as Needed.) 14 tablet 1   ? levothyroxine (SYNTHROID) 50 mcg tablet Take 50 mcg by mouth daily 30 minutes before breakfast.     ? lisinopril (ZESTRIL) 5 mg tablet Take one tablet by mouth daily. 90 tablet 3   ? LORazepam (ATIVAN) 1 mg tablet Take one half to one tablet by mouth twice daily as needed for severe anxiety that is not responsive to hydroxyzine. 60 tablet 0   ? meloxicam (MOBIC) 15 mg tablet Take 15 mg by mouth daily.     ? metFORMIN-XR (GLUCOPHAGE XR) 500 mg extended release tablet Take 1,000 mg by mouth twice daily with meals.     ? Needle (Disp) 27 G 27 gauge x 1/2 ndle Use as directed. 20 each 11   ? omega-3s-dha-epa-fish oil (FISH OIL) 120 mg-180 mg- 60 mg-1,200 mg cpDR Take 1 capsule by mouth daily. 30 capsule 0   ? oxyCODONE-acetaminophen(+) (PERCOCET) 7.5-325 mg tablet Take 1-2 tablets by mouth every 6 hours as needed     ? sildenafil(+) (VIAGRA) 100 mg tablet Take one tablet by mouth as Needed for Erectile dysfunction. 30-60 min prior to sexual activity 10 tablet 1   ? simvastatin (ZOCOR) 20 mg tablet Take 20 mg by mouth at bedtime daily.     ? syringe (disposable) 1 mL Use as directed. 12 each 11   ? tadalafiL (CIALIS) 5 mg tablet Take one tablet by mouth daily. 30 tablet 11   ? tamsulosin (FLOMAX) 0.4 mg  capsule Take one capsule by mouth twice daily. Do not crush, chew or open capsules. Take 30 minutes following the same meal each day. 90 capsule 3   ? testosterone cypionate (DEPO-TESTOSTERONE) 200 mg/mL injection Inject 200 mg into the muscle every 14 days.     ? tiZANidine (ZANAFLEX) 4 mg tablet Take one tablet by mouth every 6 hours as needed. 15 tablet 3   ? vitamins, multiple tablet Take 1 tablet by mouth daily.     ? zolpidem (AMBIEN) 5 mg tablet Take one tablet by mouth at bedtime as needed for Sleep. 30 tablet 0     Current Facility-Administered Medications   Medication Dose Route Frequency Provider Last Rate Last Admin   ? alprostadil 20 mcg/mL IC inj(cmpd) 5 mcg  5 mcg Intracavernosal ONCE Makenley Shimp T, APRN-NP           Allergies   Allergen Reactions   ? Vancomycin SEE COMMENTS     PT HAD AKI KIDNEY FAILURE FROM VANCOMYCIN       Social History     Socioeconomic History   ? Marital status: Single   Tobacco Use   ? Smoking status: Former Smoker     Types: Cigarettes   ? Smokeless tobacco: Current User     Types: Chew   Substance and Sexual Activity   ? Alcohol use: Yes     Comment: social drinker   ? Drug use: Never         Review of Systems   Constitutional: Negative for activity change, appetite change, chills, diaphoresis, fatigue, fever and unexpected weight change.   HENT: Negative for congestion, hearing loss, mouth sores and sinus pressure.    Eyes: Negative for visual disturbance.   Respiratory: Negative for apnea, cough, chest tightness and shortness of breath.    Cardiovascular: Negative for chest pain, palpitations and leg swelling.   Gastrointestinal: Negative for abdominal pain, blood in stool, constipation, diarrhea, nausea, rectal pain and vomiting.   Genitourinary: Negative for decreased urine volume, difficulty urinating, dysuria, enuresis, flank pain, frequency, genital sores, hematuria, penile discharge, penile pain, penile swelling, scrotal swelling, testicular pain and urgency.   Musculoskeletal: Negative for arthralgias, back pain, gait problem and myalgias.   Skin: Negative for rash and wound.   Neurological: Negative for dizziness, tremors, seizures, syncope, weakness, light-headedness, numbness and headaches.   Hematological: Negative for adenopathy. Does not bruise/bleed easily.   Psychiatric/Behavioral: Negative for decreased concentration and dysphoric mood. The patient is not nervous/anxious.        Objective:         ? albuterol 0.083% (PROVENTIL) 2.5 mg /3 mL (0.083 %) nebulizer solution Inhale 3 mL solution by nebulizer as directed every 6 hours as needed for Wheezing or Shortness of Breath.   ? albuterol sulfate (PROAIR HFA) 90 mcg/actuation HFA aerosol inhaler INHALE 2 PUFFS BY MOUTH FOUR TIMES DAILY AS NEEDED FOR SHORTNESS OF BREATH   ? alprostadil 20 mcg/mL soln IC inj(cmpd) Inject five mcg into base of penis as directed as Needed. Max 1 dose/ 24-48 hrs.   ? aspirin EC 81 mg tablet Take 81 mg by mouth daily. Take with food.   ? carvediloL (COREG) 25 mg tablet TAKE ONE TABLET BY MOUTH TWICE DAILY WITH FOOD   ? cholecalciferol (vitamin D3) (OPTIMAL D3) 50,000 units capsule TAKE ONE CAPSULE BY MOUTH WEEKLY ON FRIDAY   ? citalopram (CELEXA) 40 mg tablet Take one tablet by mouth daily. Please contact clinic to make an upcoming  visit [phone]. Thank you   ? diclofenac (VOLTAREN) 1 % topical gel Apply four g topically to affected area four times daily. (Patient taking differently: Apply 4 g topically to affected area four times daily as needed.)   ? divalproex (DEPAKOTE ER) 500 mg ER tablet Take one tablet by mouth at bedtime daily. Take with food.   ? docusate (COLACE) 100 mg capsule Take 100 mg by mouth daily.   ? empagliflozin (JARDIANCE) 10 mg tablet Take one tablet by mouth daily.   ? famotidine (PEPCID) 20 mg tablet Take 40 mg by mouth daily as needed. Indications: heartburn   ? fluticasone propionate (FLONASE) 50 mcg/actuation nasal spray Apply 1 spray to each nostril as directed twice daily. Shake bottle gently before using.   ? fluticasone propionate (FLOVENT HFA) 110 mcg/actuation inhaler Inhale 2 puffs by mouth into the lungs twice daily.   ? fluticasone-salmeterol (ADVAIR DISKUS) 250-50 mcg inhalation disk Inhale one puff by mouth into the lungs twice daily. (Patient taking differently: Inhale 1 puff by mouth into the lungs as Needed.)   ? FREESTYLE LIBRE 2 SENSOR sensor Use one each as directed before meals and at bedtime. Use as directed.  Indications: type 2 diabetes mellitus   ? gabapentin (NEURONTIN) 800 mg tablet Take 1 tablet by mouth three times daily.   ? guaiFENesin LA (MUCINEX) 600 mg tablet Take one tablet by mouth twice daily.   ? hydrOXYzine HCL (ATARAX) 25 mg tablet Take one tablet by mouth four times a day while awake as needed for Anxiety. TAKE ONE TABLET BY MOUTH FOUR TIMES DAILY AS NEEDED FOR itching/swelling/anxiety   ? insulin lispro(+) (HUMALOG) 100 unit/mL injection Inject twenty four Units under the skin three times daily before meals.   ? ipratropium bromide (ATROVENT) 0.02 % nebulizer solution Inhale 1 vial by mouth into the lungs four times daily as needed for Shortness of Breath or Wheezing.   ? lactobacillus rhamnosus (GG) (CULTURELLE) 10 billion cell cap Take one capsule by mouth daily with breakfast.   ? levoFLOXacin (LEVAQUIN) 500 mg tablet Take one tablet by mouth daily. (Patient taking differently: Take 500 mg by mouth as Needed.)   ? levothyroxine (SYNTHROID) 50 mcg tablet Take 50 mcg by mouth daily 30 minutes before breakfast.   ? lisinopril (ZESTRIL) 5 mg tablet Take one tablet by mouth daily.   ? LORazepam (ATIVAN) 1 mg tablet Take one half to one tablet by mouth twice daily as needed for severe anxiety that is not responsive to hydroxyzine.   ? meloxicam (MOBIC) 15 mg tablet Take 15 mg by mouth daily.   ? metFORMIN-XR (GLUCOPHAGE XR) 500 mg extended release tablet Take 1,000 mg by mouth twice daily with meals.   ? Needle (Disp) 27 G 27 gauge x 1/2 ndle Use as directed.   ? omega-3s-dha-epa-fish oil (FISH OIL) 120 mg-180 mg- 60 mg-1,200 mg cpDR Take 1 capsule by mouth daily.   ? oxyCODONE-acetaminophen(+) (PERCOCET) 7.5-325 mg tablet Take 1-2 tablets by mouth every 6 hours as needed   ? sildenafil(+) (VIAGRA) 100 mg tablet Take one tablet by mouth as Needed for Erectile dysfunction. 30-60 min prior to sexual activity   ? simvastatin (ZOCOR) 20 mg tablet Take 20 mg by mouth at bedtime daily.   ? syringe (disposable) 1 mL Use as directed.   ? tadalafiL (CIALIS) 5 mg tablet Take one tablet by mouth daily.   ? tamsulosin (FLOMAX) 0.4 mg capsule Take one capsule by mouth twice daily. Do not  crush, chew or open capsules. Take 30 minutes following the same meal each day.   ? testosterone cypionate (DEPO-TESTOSTERONE) 200 mg/mL injection Inject 200 mg into the muscle every 14 days.   ? tiZANidine (ZANAFLEX) 4 mg tablet Take one tablet by mouth every 6 hours as needed.   ? vitamins, multiple tablet Take 1 tablet by mouth daily.   ? zolpidem (AMBIEN) 5 mg tablet Take one tablet by mouth at bedtime as needed for Sleep.     Vitals:    10/29/20 1038   BP: 105/60   BP Source: Arm, Left Upper   Pulse: 91   Temp: 36.2 ?C (97.1 ?F)   SpO2: 95%   TempSrc: Temporal   PainSc: Four   Weight: (!) 139.3 kg (307 lb)   Height: 170.2 cm (5' 7)     Body mass index is 48.08 kg/m?Marland Kitchen       Physical Exam  Constitutional:       General: He is not in acute distress.     Appearance: He is well-developed. He is not diaphoretic.   HENT:      Head: Normocephalic and atraumatic.   Eyes:      General:         Right eye: No discharge.         Left eye: No discharge.      Conjunctiva/sclera: Conjunctivae normal.   Cardiovascular:      Rate and Rhythm: Normal rate.   Pulmonary:      Effort: Pulmonary effort is normal. No respiratory distress.   Musculoskeletal:         General: No deformity.      Cervical back: Normal range of motion.   Skin:     General: Skin is warm and dry.   Neurological:      Mental Status: He is alert and oriented to person, place, and time.   Psychiatric:         Behavior: Behavior normal.         Thought Content: Thought content normal.         Judgment: Judgment normal.       Procedure & Counseling:  I counseled him that the purpose & goal of intracavernosal injections (ICI) is to achieve a functional erection (rigid enough for penetration & intercousre) that does not last longer than 1 hour.  Possible risks & complications of penile injection were discussed including, but not limited to bleeding, bruising, pain, unsatisfactory results, penile curvature (Peyronie?s disease), & priapism were discussed in detail.  Priapism (prolonged painful erection lasting longer than 4 hrs) is a medical emergency & requires immediate medical attention & he should proceed to the nearest Emergency Dept.  If priapism is not treated in a timely manner it may lead to permanent damage & erectile dysfunction.  I recommend that if he develops an erection last seems to be lasting longer than it should or starts to be painful that he could try taking Sudafed (pseudephredrine) 60 - 120 mg to try induce detumescence.  If this is not successful then he should proceed to the Emergency Dept.    Pt demonstrated an understanding & wished to proceed.  Needle safety reviewed.  I gave him careful verbal & visual instructions of how to perform self injection, specifically inject along mid-shaft lateral aspects of penis, especially to avoid visible blood vessels, ventral & dorsal aspects, & glans.  I taught him how to draw up medication in 1 mL syringe.  0.25 mL  of Alprostadil 20 mcg/mL.  I injected along the (L) lateral phallus.  After ~ 10 min, phallus demonstrated some minimal tumescence, but not what would be considered a functional erection.  After approximately 15 - 30 minutes, he had had adequate detumescence.  I advised him not to perform more than 1 injection/ 24-48 hrs and/or 3x/ wk.  Printed instructions were provided to pt for home reference.  He demonstrated a good understanding of how to perform self injection.         Assessment and Plan:    Problem   Erectile Dysfunction     Erectile dysfunction  Taught ICI as detailed above.  Test dose given as detailed above. 0.25 mL resulted in 40% fullness. Instructed to start with 0.5, increase by 0.1 mL until rigid erection lasting less than an hour.   After careful consideration, he wishes to start ICI at home.  Start Alprostadil 20 mcg and titrate to lowest effective dose, as instructed.  He demonstrated an understanding & agrees w/ this plan.  Specific precautions & warnings given to pt, especially regarding risk of priapism warranting urgent evaluation & management.  RTC 6 weeks via telehealth      Orders Placed This Encounter   ? alprostadil 20 mcg/mL IC inj(cmpd) 5 mcg   ? tadalafiL (CIALIS) 5 mg tablet   ? Needle (Disp) 27 G 27 gauge x 1/2 ndle   ? syringe (disposable) 1 mL   ? alprostadil 20 mcg/mL soln IC inj(cmpd)     Return in about 6 weeks (around 12/10/2020) for Telehealth ICI F/U.      Abby Potash, APRN-NP  Department of Urology

## 2020-10-29 ENCOUNTER — Encounter: Admit: 2020-10-29 | Discharge: 2020-10-29 | Payer: MEDICARE

## 2020-10-29 ENCOUNTER — Ambulatory Visit: Admit: 2020-10-29 | Discharge: 2020-10-29 | Payer: MEDICARE

## 2020-10-29 DIAGNOSIS — J45909 Unspecified asthma, uncomplicated: Secondary | ICD-10-CM

## 2020-10-29 DIAGNOSIS — N529 Male erectile dysfunction, unspecified: Secondary | ICD-10-CM

## 2020-10-29 DIAGNOSIS — G4730 Sleep apnea, unspecified: Secondary | ICD-10-CM

## 2020-10-29 DIAGNOSIS — R7989 Other specified abnormal findings of blood chemistry: Secondary | ICD-10-CM

## 2020-10-29 DIAGNOSIS — G4733 Obstructive sleep apnea (adult) (pediatric): Secondary | ICD-10-CM

## 2020-10-29 DIAGNOSIS — E039 Hypothyroidism, unspecified: Secondary | ICD-10-CM

## 2020-10-29 DIAGNOSIS — G629 Polyneuropathy, unspecified: Secondary | ICD-10-CM

## 2020-10-29 DIAGNOSIS — E119 Type 2 diabetes mellitus without complications: Secondary | ICD-10-CM

## 2020-10-29 DIAGNOSIS — I10 Essential (primary) hypertension: Secondary | ICD-10-CM

## 2020-10-29 DIAGNOSIS — J449 Chronic obstructive pulmonary disease, unspecified: Secondary | ICD-10-CM

## 2020-10-29 DIAGNOSIS — E669 Obesity, unspecified: Secondary | ICD-10-CM

## 2020-10-29 MED ORDER — ALPROSTADIL(#) 20 MCG/ML IC SOLN
5 ug | INTRACAVERNOUS | 11 refills | Status: AC | PRN
Start: 2020-10-29 — End: ?

## 2020-10-29 MED ORDER — TADALAFIL 5 MG PO TAB
5 mg | ORAL_TABLET | Freq: Every day | ORAL | 11 refills | Status: AC
Start: 2020-10-29 — End: ?

## 2020-10-29 MED ORDER — NEEDLE (DISP) 27 GAUGE 27 GAUGE X 1/2" MISC NDLE
11 refills | 30.00000 days | Status: AC
Start: 2020-10-29 — End: ?

## 2020-10-29 MED ORDER — SYRINGE (DISPOSABLE) 1 ML MISC SYRG
11 refills | 30.00000 days | Status: AC
Start: 2020-10-29 — End: ?

## 2020-10-29 MED ORDER — ALPROSTADIL(#) 20 MCG/ML IC SOLN
5 ug | Freq: Once | INTRACAVERNOUS | 0 refills | Status: AC
Start: 2020-10-29 — End: ?

## 2020-10-29 NOTE — Assessment & Plan Note
Taught ICI as detailed above.  Test dose given as detailed above. 0.25 mL resulted in 40% fullness. Instructed to start with 0.5, increase by 0.1 mL until rigid erection lasting less than an hour.   After careful consideration, he wishes to start ICI at home.  Start Alprostadil 20 mcg and titrate to lowest effective dose, as instructed.  He demonstrated an understanding & agrees w/ this plan.  Specific precautions & warnings given to pt, especially regarding risk of priapism warranting urgent evaluation & management.  RTC 6 weeks via telehealth

## 2020-11-08 ENCOUNTER — Encounter: Admit: 2020-11-08 | Discharge: 2020-11-08 | Payer: MEDICARE

## 2020-11-08 MED ORDER — DIVALPROEX 500 MG PO TB24
ORAL_TABLET | Freq: Every evening | 2 refills
Start: 2020-11-08 — End: ?

## 2020-11-15 ENCOUNTER — Encounter: Admit: 2020-11-15 | Discharge: 2020-11-15 | Payer: MEDICARE

## 2020-11-15 NOTE — Telephone Encounter
RN emailed Sharyl Nimrod from [employer] for an update.    RN received response  "we are out of network with his secondary insurance. Patient is aware. Has Medicaid and medicare"    RN filled out information for [employer]  Given to [employer] PA-C to sign    Robley Fries, RN

## 2020-11-19 ENCOUNTER — Encounter: Admit: 2020-11-19 | Discharge: 2020-11-19 | Payer: MEDICARE

## 2020-11-19 MED ORDER — CITALOPRAM 40 MG PO TAB
40 mg | ORAL_TABLET | Freq: Every day | ORAL | 0 refills | Status: AC
Start: 2020-11-19 — End: ?

## 2020-11-19 NOTE — Telephone Encounter
Last office visit 09/04/2020  Continue citalopram 40mg    Medication refill per protocol

## 2020-11-20 IMAGING — RF FL guided spine inject
1 series · 9 of 9 positions shown · non-contrast
Comparison: none

[Series 1: run · 3 acquisitions, 9 frames shown]
[im 1/3]
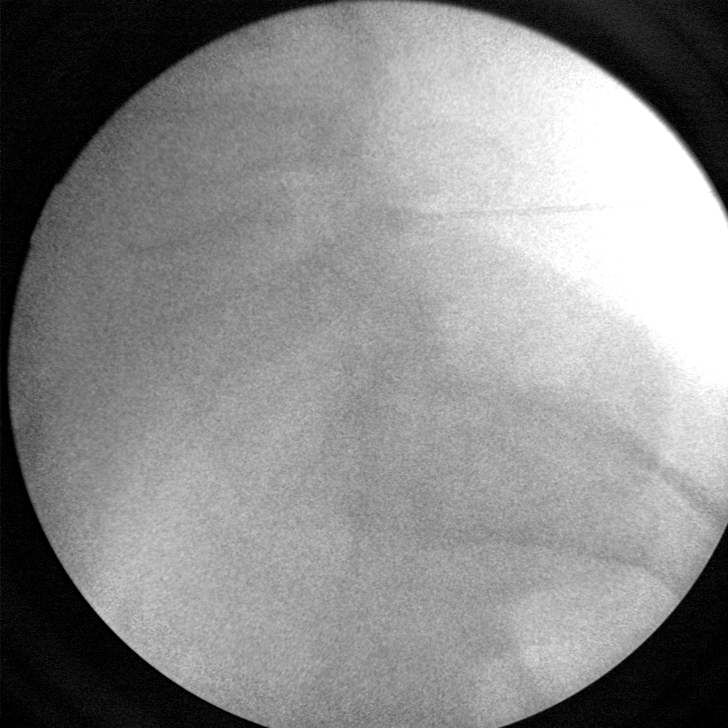
[im 2/3]
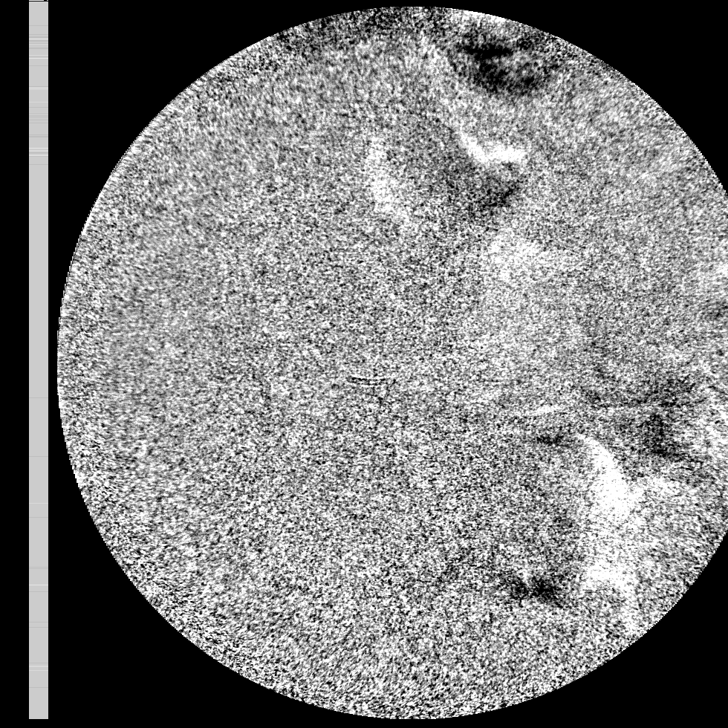
[im 2/3]
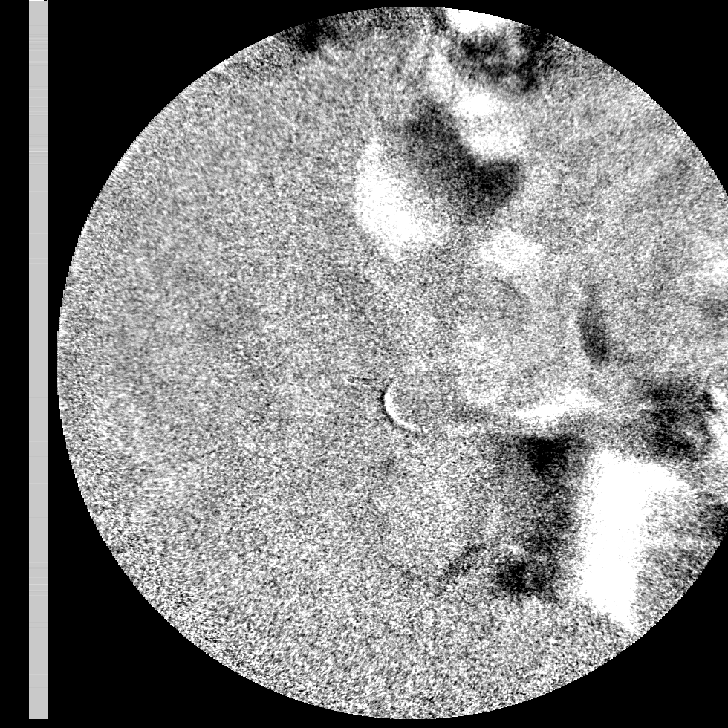
[im 2/3]
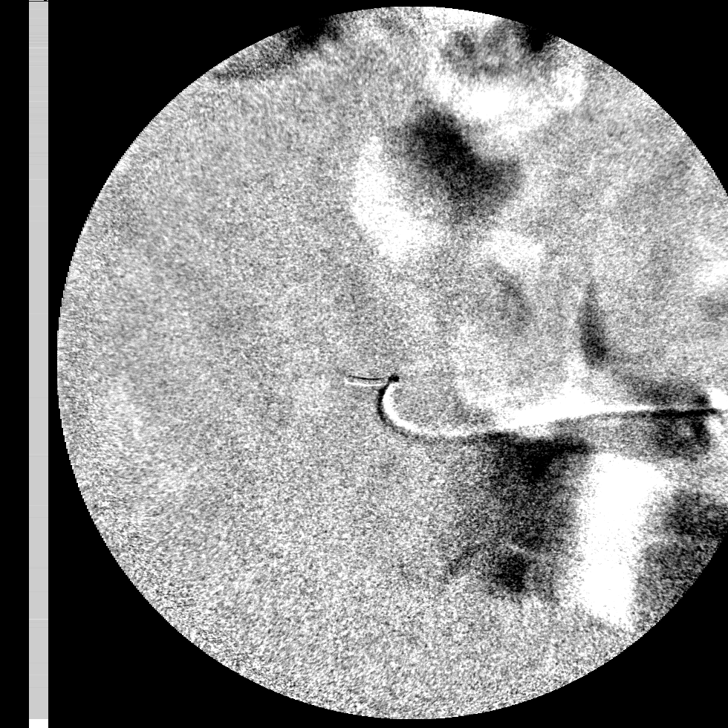
[im 2/3]
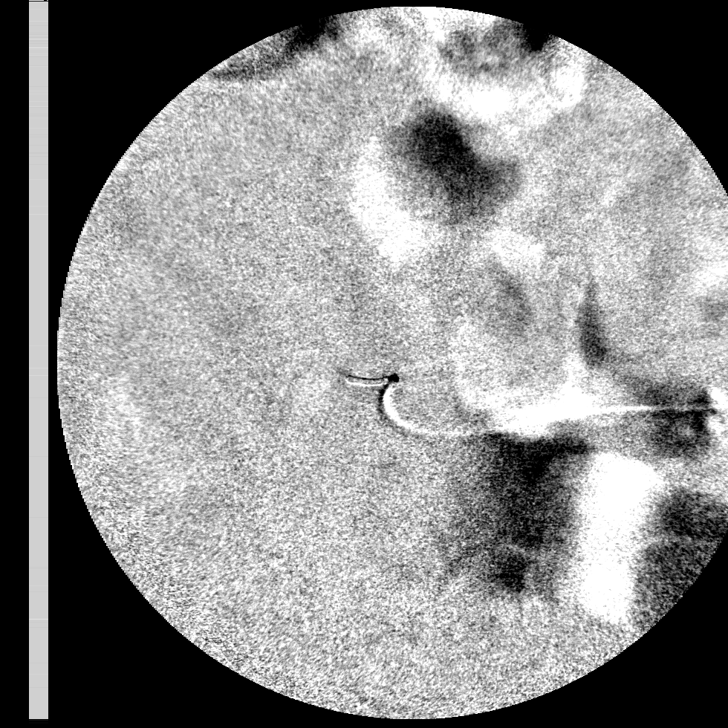
[im 3/3]
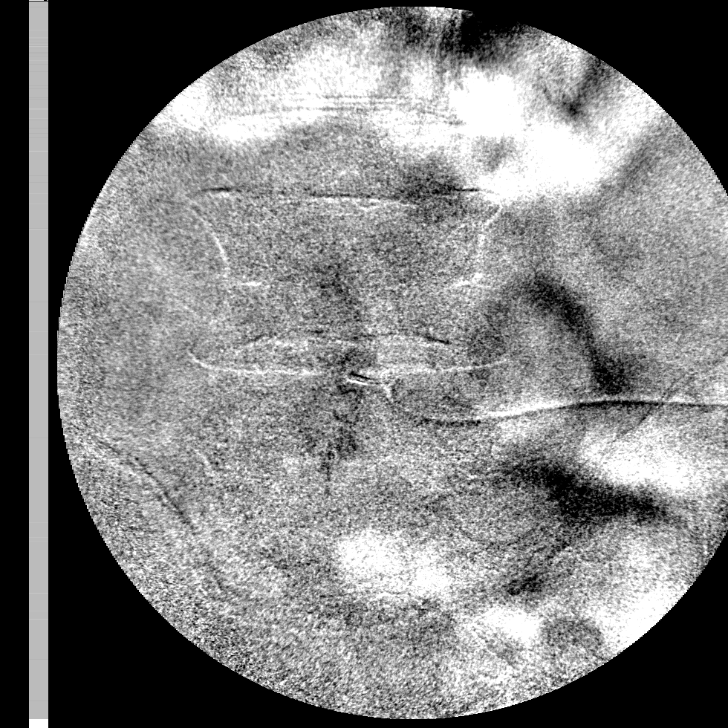
[im 3/3]
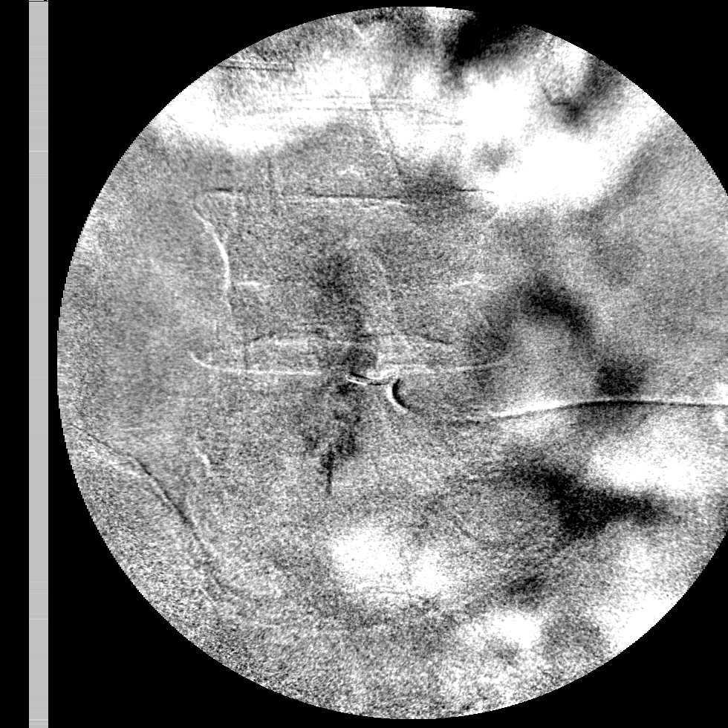
[im 3/3]
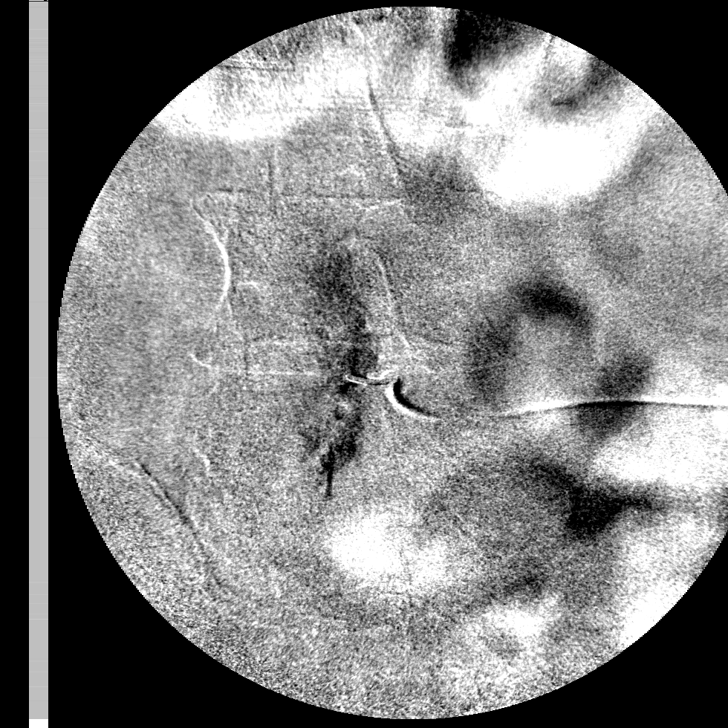
[im 3/3]
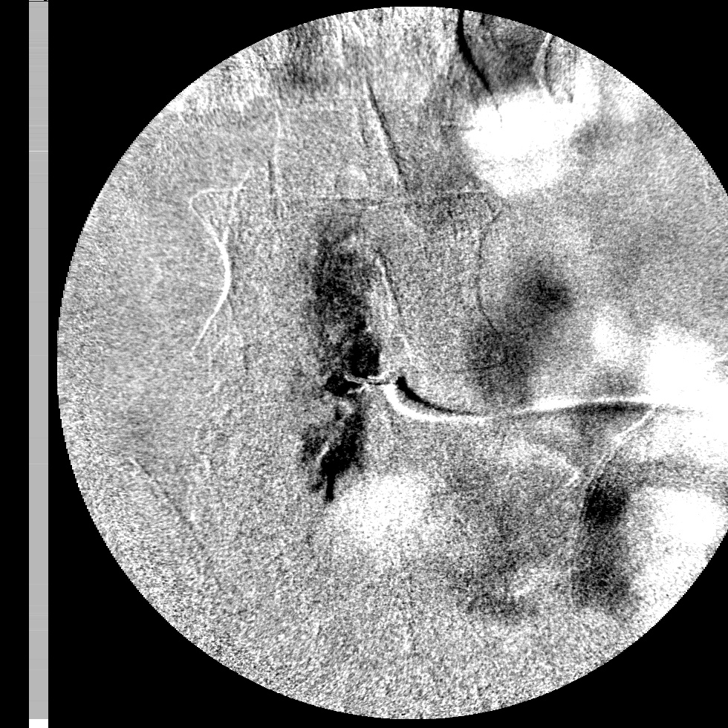

[9 of 9 positions shown; findings below may reference images not displayed]

EXAM

Fluoroscopically guided spinal injection:

INDICATION

ENVET
ENVET Dr Rodrigues 16.18 mGy, 22.7 sec Fluoro, 3 images sent. CS

FINDINGS

Intraoperative images for a lumbar epidural steroid injection.

IMPRESSION

Intraoperative images for a lumbar epidural steroid injection.

Tech Notes:

ENVET Dr Rodrigues, OR 3, 16.18 mGy, 22.7 sec Fluoro, 3 images sent. CS

## 2020-11-21 ENCOUNTER — Encounter: Admit: 2020-11-21 | Discharge: 2020-11-21 | Payer: MEDICARE

## 2020-11-21 NOTE — Telephone Encounter
Outgoing call to check patient in. No answer.

## 2020-11-21 NOTE — Progress Notes
Called pt to check into [facility] appt. Did not respond so this MA LVM with callback number.

## 2020-11-22 ENCOUNTER — Encounter: Admit: 2020-11-22 | Discharge: 2020-11-22 | Payer: MEDICARE

## 2020-11-22 ENCOUNTER — Ambulatory Visit: Admit: 2020-11-22 | Discharge: 2020-11-22 | Payer: MEDICARE

## 2020-11-22 DIAGNOSIS — I10 Essential (primary) hypertension: Secondary | ICD-10-CM

## 2020-11-22 MED ORDER — PERFLUTREN LIPID MICROSPHERES 1.1 MG/ML IV SUSP
1-10 mL | Freq: Once | INTRAVENOUS | 0 refills | Status: CP | PRN
Start: 2020-11-22 — End: ?
  Administered 2020-11-22: 18:00:00 1 mL via INTRAVENOUS

## 2020-11-25 ENCOUNTER — Encounter: Admit: 2020-11-25 | Discharge: 2020-11-25 | Payer: MEDICARE

## 2020-11-25 DIAGNOSIS — I4510 Unspecified right bundle-branch block: Secondary | ICD-10-CM

## 2020-11-25 NOTE — Assessment & Plan Note
Per Dr Neale Burly:  This was done with concerns for right bundle branch block on ECG, no significant concerning findings on this study

## 2020-11-25 NOTE — Telephone Encounter
-----   Message from Jana Half, MD sent at 11/24/2020  5:49 PM CDT -----  Please update [name] with results of echocardiogram. No major concerns, LV function  normal, RV mildly dilated not of clinical concern.    This was done with concerns for right bundle branch block on ECG, no significant concerning findings on this study    Thanks  Cletis Athens

## 2020-11-25 NOTE — Telephone Encounter
Called pt with results of 2D Doppler Echo as reviewed by JAF  .   Discussed results per JAF. PVU & agrees with treatment plan.  MyChart message sent in addition to phone conversation. Problem list updated.

## 2020-12-18 ENCOUNTER — Encounter: Admit: 2020-12-18 | Discharge: 2020-12-18 | Payer: MEDICARE

## 2020-12-18 MED ORDER — CITALOPRAM 40 MG PO TAB
40 mg | ORAL_TABLET | Freq: Every day | ORAL | 0 refills | Status: AC
Start: 2020-12-18 — End: ?

## 2020-12-18 NOTE — Telephone Encounter
Last office visit 09/04/2020  Continue citalopram 40mg  by mouth everyday  Medication refilled per protocol

## 2020-12-25 ENCOUNTER — Encounter: Admit: 2020-12-25 | Discharge: 2020-12-25 | Payer: MEDICARE

## 2020-12-25 NOTE — Progress Notes
Date of Service: 12/27/2020     Subjective:             Thomas Francis is a 50 y.o. male    History of Present Illness    Obtained patient's verbal consent to treat them and their agreement to [facility] financial policy and NPP via this telehealth visit during the [facility] Emergency    This encounter was conducted as an electronic visit through the [employer] where I could see and speak with the patient directly.    50 year old male returns via telehealth for further evaluation following penile injection teaching appointment.  Patient reports he has been able to perform multiple trials, titrated up to 0.5 mL of alprostadil 20 mcg/mL.  Patient reports that he is received some increase in rigidity around 40 to 50%, erections lasting less than 30 minutes.  He does report some episodes in which he had unsuccessful results without any increase in rigidity following injections.    Medical History:   Diagnosis Date   ? Asthma    ? COPD (chronic obstructive pulmonary disease) (HCC)    ? DM (diabetes mellitus) (HCC)    ? Hypertension    ? Hypothyroid    ? Low testosterone    ? Morbid obesity with BMI of 50.0-59.9, adult (HCC) 06/17/2017   ? 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 Obesity    ? Obstructive sleep apnea    ? Sleep apnea        Surgical History:   Procedure Laterality Date   ? ABSCESS DRAINAGE Right 12/2016    [facility] right buttock cheek.   ? TRANSURETHRAL DRAINAGE ABSCESS - PROSTATE N/A 06/01/2017    Performed by Eveline Keto, MD at [facility] OR   ? ANGIOGRAPHY CORONARY ARTERY WITH LEFT HEART CATHETERIZATION N/A 07/14/2018    Performed by Myriam Jacobson, MD at [facility] CATH LAB   ? POSSIBLE PERCUTANEOUS CORONARY STENT PLACEMENT WITH ANGIOPLASTY N/A 07/14/2018    Performed by Myriam Jacobson, MD at [facility] CATH LAB   ? ESOPHAGOGASTRODUODENOSCOPY WITH SPECIMEN COLLECTION BY BRUSHING/ WASHING N/A 01/20/2019    Performed by Meyer Cory, MD at [facility] ENDO   ? ESOPHAGOGASTRODUODENOSCOPY WITH BIOPSY - FLEXIBLE N/A 01/20/2019    Performed by Meyer Cory, MD at [facility] ENDO   ? TONSILLECTOMY         Family History   Problem Relation Age of Onset   ? None Reported Mother    ? Heart Disease Father        Current Outpatient Medications   Medication Sig Dispense Refill   ? albuterol 0.083% (PROVENTIL) 2.5 mg /3 mL (0.083 %) nebulizer solution Inhale 3 mL solution by nebulizer as directed every 6 hours as needed for Wheezing or Shortness of Breath.     ? albuterol sulfate (PROAIR HFA) 90 mcg/actuation HFA aerosol inhaler INHALE 2 PUFFS BY MOUTH FOUR TIMES DAILY AS NEEDED FOR SHORTNESS OF BREATH     ? alprostadil 20 mcg/mL soln IC inj(cmpd) Inject five mcg into base of penis as directed as Needed. Max 1 dose/ 24-48 hrs. 10 mL 11   ? aspirin EC 81 mg tablet Take 81 mg by mouth daily. Take with food.     ? carvediloL (COREG) 25 mg tablet TAKE ONE TABLET BY MOUTH TWICE DAILY WITH FOOD 180 tablet 3   ? cholecalciferol (vitamin D3) (OPTIMAL D3) 50,000 units capsule TAKE ONE CAPSULE BY MOUTH WEEKLY ON FRIDAY     ?  citalopram (CELEXA) 40 mg tablet Take one tablet by mouth daily. Please contact clinic to make an upcoming visit [phone]. Thank you 30 tablet 0   ? diclofenac (VOLTAREN) 1 % topical gel Apply four g topically to affected area four times daily. (Patient taking differently: Apply 4 g topically to affected area four times daily as needed.) 300 g 3   ? divalproex (DEPAKOTE ER) 500 mg ER tablet TAKE ONE TABLET BY MOUTH AT BEDTIME WITH FOOD 30 tablet 2   ? docusate (COLACE) 100 mg capsule Take 100 mg by mouth daily.     ? empagliflozin (JARDIANCE) 10 mg tablet Take one tablet by mouth daily. 90 tablet 3   ? famotidine (PEPCID) 20 mg tablet Take 40 mg by mouth daily as needed. Indications: heartburn     ? fluticasone propionate (FLONASE) 50 mcg/actuation nasal spray Apply 1 spray to each nostril as directed twice daily. Shake bottle gently before using.     ? fluticasone propionate (FLOVENT HFA) 110 mcg/actuation inhaler Inhale 2 puffs by mouth into the lungs twice daily.     ? fluticasone-salmeterol (ADVAIR DISKUS) 250-50 mcg inhalation disk Inhale one puff by mouth into the lungs twice daily. (Patient taking differently: Inhale 1 puff by mouth into the lungs as Needed.) 3 Inhaler 3   ? FREESTYLE LIBRE 2 SENSOR sensor Use one each as directed before meals and at bedtime. Use as directed.  Indications: type 2 diabetes mellitus 6 each 3   ? gabapentin (NEURONTIN) 800 mg tablet Take 1 tablet by mouth three times daily.     ? guaiFENesin LA (MUCINEX) 600 mg tablet Take one tablet by mouth twice daily. 180 tablet 0   ? hydrOXYzine HCL (ATARAX) 25 mg tablet Take one tablet by mouth four times a day while awake as needed for Anxiety. TAKE ONE TABLET BY MOUTH FOUR TIMES DAILY AS NEEDED FOR itching/swelling/anxiety 120 tablet 2   ? insulin lispro(+) (HUMALOG) 100 unit/mL injection Inject twenty four Units under the skin three times daily before meals. 3 vial 11   ? ipratropium bromide (ATROVENT) 0.02 % nebulizer solution Inhale 1 vial by mouth into the lungs four times daily as needed for Shortness of Breath or Wheezing.     ? lactobacillus rhamnosus (GG) (CULTURELLE) 10 billion cell cap Take one capsule by mouth daily with breakfast. 90 capsule 0   ? levoFLOXacin (LEVAQUIN) 500 mg tablet Take one tablet by mouth daily. (Patient taking differently: Take 500 mg by mouth as Needed.) 14 tablet 1   ? levothyroxine (SYNTHROID) 50 mcg tablet Take 50 mcg by mouth daily 30 minutes before breakfast.     ? lisinopril (ZESTRIL) 5 mg tablet Take one tablet by mouth daily. 90 tablet 3   ? LORazepam (ATIVAN) 1 mg tablet Take one half to one tablet by mouth twice daily as needed for severe anxiety that is not responsive to hydroxyzine. 60 tablet 0   ? meloxicam (MOBIC) 15 mg tablet Take 15 mg by mouth daily.     ? metFORMIN-XR (GLUCOPHAGE XR) 500 mg extended release tablet Take 1,000 mg by mouth twice daily with meals.     ? Needle (Disp) 27 G 27 gauge x 1/2 ndle Use as directed. 20 each 11   ? omega-3s-dha-epa-fish oil (FISH OIL) 120 mg-180 mg- 60 mg-1,200 mg cpDR Take 1 capsule by mouth daily. 30 capsule 0   ? oxyCODONE-acetaminophen(+) (PERCOCET) 7.5-325 mg tablet Take 1-2 tablets by mouth every 6 hours as needed     ?  sildenafil(+) (VIAGRA) 100 mg tablet Take one tablet by mouth as Needed for Erectile dysfunction. 30-60 min prior to sexual activity 10 tablet 1   ? simvastatin (ZOCOR) 20 mg tablet Take 20 mg by mouth at bedtime daily.     ? syringe (disposable) 1 mL Use as directed. 12 each 11   ? tadalafiL (CIALIS) 5 mg tablet Take one tablet by mouth daily. 30 tablet 11   ? tamsulosin (FLOMAX) 0.4 mg capsule Take one capsule by mouth twice daily. Do not crush, chew or open capsules. Take 30 minutes following the same meal each day. 90 capsule 3   ? testosterone cypionate (DEPO-TESTOSTERONE) 200 mg/mL injection Inject 200 mg into the muscle every 14 days.     ? tiZANidine (ZANAFLEX) 4 mg tablet Take one tablet by mouth every 6 hours as needed. 15 tablet 3   ? vitamins, multiple tablet Take 1 tablet by mouth daily.     ? zolpidem (AMBIEN) 5 mg tablet Take one tablet by mouth at bedtime as needed for Sleep. 30 tablet 0     No current facility-administered medications for this visit.       Allergies   Allergen Reactions   ? Vancomycin SEE COMMENTS     PT HAD AKI KIDNEY FAILURE FROM VANCOMYCIN       Social History     Socioeconomic History   ? Marital status: Single   Tobacco Use   ? Smoking status: Former Smoker     Types: Cigarettes   ? Smokeless tobacco: Current User     Types: Chew   Substance and Sexual Activity   ? Alcohol use: Yes     Comment: social drinker   ? Drug use: Never         Review of Systems   Constitutional: Negative for activity change, appetite change, chills, diaphoresis, fatigue, fever and unexpected weight change.   HENT: Negative for congestion, hearing loss, mouth sores and sinus pressure.    Eyes: Negative for visual disturbance. Respiratory: Negative for apnea, cough, chest tightness and shortness of breath.    Cardiovascular: Negative for chest pain, palpitations and leg swelling.   Gastrointestinal: Negative for abdominal pain, blood in stool, constipation, diarrhea, nausea, rectal pain and vomiting.   Genitourinary: Negative for decreased urine volume, difficulty urinating, dysuria, enuresis, flank pain, frequency, genital sores, hematuria, penile discharge, penile pain, penile swelling, scrotal swelling, testicular pain and urgency.   Musculoskeletal: Negative for arthralgias, back pain, gait problem and myalgias.   Skin: Negative for rash and wound.   Neurological: Negative for dizziness, tremors, seizures, syncope, weakness, light-headedness, numbness and headaches.   Hematological: Negative for adenopathy. Does not bruise/bleed easily.   Psychiatric/Behavioral: Negative for decreased concentration and dysphoric mood. The patient is not nervous/anxious.        Objective:         ? albuterol 0.083% (PROVENTIL) 2.5 mg /3 mL (0.083 %) nebulizer solution Inhale 3 mL solution by nebulizer as directed every 6 hours as needed for Wheezing or Shortness of Breath.   ? albuterol sulfate (PROAIR HFA) 90 mcg/actuation HFA aerosol inhaler INHALE 2 PUFFS BY MOUTH FOUR TIMES DAILY AS NEEDED FOR SHORTNESS OF BREATH   ? alprostadil 20 mcg/mL soln IC inj(cmpd) Inject five mcg into base of penis as directed as Needed. Max 1 dose/ 24-48 hrs.   ? aspirin EC 81 mg tablet Take 81 mg by mouth daily. Take with food.   ? carvediloL (COREG) 25 mg tablet TAKE ONE  TABLET BY MOUTH TWICE DAILY WITH FOOD   ? cholecalciferol (vitamin D3) (OPTIMAL D3) 50,000 units capsule TAKE ONE CAPSULE BY MOUTH WEEKLY ON FRIDAY   ? citalopram (CELEXA) 40 mg tablet Take one tablet by mouth daily. Please contact clinic to make an upcoming visit [phone]. Thank you   ? diclofenac (VOLTAREN) 1 % topical gel Apply four g topically to affected area four times daily. (Patient taking differently: Apply 4 g topically to affected area four times daily as needed.)   ? divalproex (DEPAKOTE ER) 500 mg ER tablet TAKE ONE TABLET BY MOUTH AT BEDTIME WITH FOOD   ? docusate (COLACE) 100 mg capsule Take 100 mg by mouth daily.   ? empagliflozin (JARDIANCE) 10 mg tablet Take one tablet by mouth daily.   ? famotidine (PEPCID) 20 mg tablet Take 40 mg by mouth daily as needed. Indications: heartburn   ? fluticasone propionate (FLONASE) 50 mcg/actuation nasal spray Apply 1 spray to each nostril as directed twice daily. Shake bottle gently before using.   ? fluticasone propionate (FLOVENT HFA) 110 mcg/actuation inhaler Inhale 2 puffs by mouth into the lungs twice daily.   ? fluticasone-salmeterol (ADVAIR DISKUS) 250-50 mcg inhalation disk Inhale one puff by mouth into the lungs twice daily. (Patient taking differently: Inhale 1 puff by mouth into the lungs as Needed.)   ? FREESTYLE LIBRE 2 SENSOR sensor Use one each as directed before meals and at bedtime. Use as directed.  Indications: type 2 diabetes mellitus   ? gabapentin (NEURONTIN) 800 mg tablet Take 1 tablet by mouth three times daily.   ? guaiFENesin LA (MUCINEX) 600 mg tablet Take one tablet by mouth twice daily.   ? hydrOXYzine HCL (ATARAX) 25 mg tablet Take one tablet by mouth four times a day while awake as needed for Anxiety. TAKE ONE TABLET BY MOUTH FOUR TIMES DAILY AS NEEDED FOR itching/swelling/anxiety   ? insulin lispro(+) (HUMALOG) 100 unit/mL injection Inject twenty four Units under the skin three times daily before meals.   ? ipratropium bromide (ATROVENT) 0.02 % nebulizer solution Inhale 1 vial by mouth into the lungs four times daily as needed for Shortness of Breath or Wheezing.   ? lactobacillus rhamnosus (GG) (CULTURELLE) 10 billion cell cap Take one capsule by mouth daily with breakfast.   ? levoFLOXacin (LEVAQUIN) 500 mg tablet Take one tablet by mouth daily. (Patient taking differently: Take 500 mg by mouth as Needed.)   ? levothyroxine (SYNTHROID) 50 mcg tablet Take 50 mcg by mouth daily 30 minutes before breakfast.   ? lisinopril (ZESTRIL) 5 mg tablet Take one tablet by mouth daily.   ? LORazepam (ATIVAN) 1 mg tablet Take one half to one tablet by mouth twice daily as needed for severe anxiety that is not responsive to hydroxyzine.   ? meloxicam (MOBIC) 15 mg tablet Take 15 mg by mouth daily.   ? metFORMIN-XR (GLUCOPHAGE XR) 500 mg extended release tablet Take 1,000 mg by mouth twice daily with meals.   ? Needle (Disp) 27 G 27 gauge x 1/2 ndle Use as directed.   ? omega-3s-dha-epa-fish oil (FISH OIL) 120 mg-180 mg- 60 mg-1,200 mg cpDR Take 1 capsule by mouth daily.   ? oxyCODONE-acetaminophen(+) (PERCOCET) 7.5-325 mg tablet Take 1-2 tablets by mouth every 6 hours as needed   ? sildenafil(+) (VIAGRA) 100 mg tablet Take one tablet by mouth as Needed for Erectile dysfunction. 30-60 min prior to sexual activity   ? simvastatin (ZOCOR) 20 mg tablet Take 20 mg by  mouth at bedtime daily.   ? syringe (disposable) 1 mL Use as directed.   ? tadalafiL (CIALIS) 5 mg tablet Take one tablet by mouth daily.   ? tamsulosin (FLOMAX) 0.4 mg capsule Take one capsule by mouth twice daily. Do not crush, chew or open capsules. Take 30 minutes following the same meal each day.   ? testosterone cypionate (DEPO-TESTOSTERONE) 200 mg/mL injection Inject 200 mg into the muscle every 14 days.   ? tiZANidine (ZANAFLEX) 4 mg tablet Take one tablet by mouth every 6 hours as needed.   ? vitamins, multiple tablet Take 1 tablet by mouth daily.   ? zolpidem (AMBIEN) 5 mg tablet Take one tablet by mouth at bedtime as needed for Sleep.     There were no vitals filed for this visit.  There is no height or weight on file to calculate BMI.       Physical Exam  Constitutional:       General: He is not in acute distress.     Appearance: He is well-developed. He is not diaphoretic.   HENT:      Head: Normocephalic and atraumatic.   Eyes:      General:         Right eye: No discharge.         Left eye: No discharge.      Conjunctiva/sclera: Conjunctivae normal.   Cardiovascular:      Rate and Rhythm: Normal rate.   Pulmonary:      Effort: Pulmonary effort is normal. No respiratory distress.   Musculoskeletal:         General: No deformity.      Cervical back: Normal range of motion.   Skin:     General: Skin is warm and dry.   Neurological:      Mental Status: He is alert and oriented to person, place, and time.   Psychiatric:         Behavior: Behavior normal.         Thought Content: Thought content normal.         Judgment: Judgment normal.            Assessment and Plan:    Problem   Erectile Dysfunction     Erectile dysfunction  50 year old male returns via telehealth for penile injection follow-up  Currently using alprostadil 20 mcg/mL, titrated up to 0.5 mL resulting in fullness around 40 to 50%, lasting less than 30 minutes  Instructed to increase to 0.75, base further increases on penile rigidity as well as erections lasting less than an hour.  May consider switching to Trimix, increasing alprostadil to 40 mcg/mL based upon further titration.    We will plan to follow-up in 6 weeks for further evaluation, instructed to call if he had questions or concerns prior to follow-up.           Return in about 6 weeks (around 02/07/2021) for Telehealth [facility] F/U.      Abby Potash, APRN-NP  Department of Urology

## 2020-12-27 ENCOUNTER — Ambulatory Visit: Admit: 2020-12-27 | Discharge: 2020-12-28 | Payer: MEDICARE

## 2020-12-27 ENCOUNTER — Encounter: Admit: 2020-12-27 | Discharge: 2020-12-27 | Payer: MEDICARE

## 2020-12-27 DIAGNOSIS — I10 Essential (primary) hypertension: Secondary | ICD-10-CM

## 2020-12-27 DIAGNOSIS — G4730 Sleep apnea, unspecified: Secondary | ICD-10-CM

## 2020-12-27 DIAGNOSIS — J449 Chronic obstructive pulmonary disease, unspecified: Secondary | ICD-10-CM

## 2020-12-27 DIAGNOSIS — G4733 Obstructive sleep apnea (adult) (pediatric): Secondary | ICD-10-CM

## 2020-12-27 DIAGNOSIS — E039 Hypothyroidism, unspecified: Secondary | ICD-10-CM

## 2020-12-27 DIAGNOSIS — J45909 Unspecified asthma, uncomplicated: Secondary | ICD-10-CM

## 2020-12-27 DIAGNOSIS — E119 Type 2 diabetes mellitus without complications: Secondary | ICD-10-CM

## 2020-12-27 DIAGNOSIS — E669 Obesity, unspecified: Secondary | ICD-10-CM

## 2020-12-27 DIAGNOSIS — N529 Male erectile dysfunction, unspecified: Secondary | ICD-10-CM

## 2020-12-27 DIAGNOSIS — R7989 Other specified abnormal findings of blood chemistry: Secondary | ICD-10-CM

## 2020-12-27 DIAGNOSIS — G629 Polyneuropathy, unspecified: Secondary | ICD-10-CM

## 2020-12-27 NOTE — Assessment & Plan Note
50 year old male returns via telehealth for penile injection follow-up  Currently using alprostadil 20 mcg/mL, titrated up to 0.5 mL resulting in fullness around 40 to 50%, lasting less than 30 minutes  Instructed to increase to 0.75, base further increases on penile rigidity as well as erections lasting less than an hour.  May consider switching to Trimix, increasing alprostadil to 40 mcg/mL based upon further titration.    We will plan to follow-up in 6 weeks for further evaluation, instructed to call if he had questions or concerns prior to follow-up.

## 2021-01-14 ENCOUNTER — Encounter: Admit: 2021-01-14 | Discharge: 2021-01-14 | Payer: MEDICARE

## 2021-01-17 ENCOUNTER — Encounter: Admit: 2021-01-17 | Discharge: 2021-01-17 | Payer: MEDICARE

## 2021-01-17 MED ORDER — CITALOPRAM 40 MG PO TAB
ORAL_TABLET | Freq: Every day | 0 refills
Start: 2021-01-17 — End: ?

## 2021-01-17 MED ORDER — LORAZEPAM 1 MG PO TAB
ORAL_TABLET | Freq: Two times a day (BID) | 0 refills | PRN
Start: 2021-01-17 — End: ?

## 2021-01-17 MED ORDER — ZOLPIDEM 5 MG PO TAB
5 mg | ORAL_TABLET | Freq: Every evening | ORAL | 0 refills | PRN
Start: 2021-01-17 — End: ?

## 2021-01-22 ENCOUNTER — Ambulatory Visit: Admit: 2021-01-22 | Discharge: 2021-01-22 | Payer: MEDICARE

## 2021-01-22 ENCOUNTER — Encounter: Admit: 2021-01-22 | Discharge: 2021-01-22 | Payer: MEDICARE

## 2021-01-22 DIAGNOSIS — G4733 Obstructive sleep apnea (adult) (pediatric): Secondary | ICD-10-CM

## 2021-01-22 DIAGNOSIS — I10 Essential (primary) hypertension: Secondary | ICD-10-CM

## 2021-01-22 DIAGNOSIS — N3281 Overactive bladder: Secondary | ICD-10-CM

## 2021-01-22 DIAGNOSIS — E119 Type 2 diabetes mellitus without complications: Secondary | ICD-10-CM

## 2021-01-22 DIAGNOSIS — R7989 Other specified abnormal findings of blood chemistry: Secondary | ICD-10-CM

## 2021-01-22 DIAGNOSIS — J45909 Unspecified asthma, uncomplicated: Secondary | ICD-10-CM

## 2021-01-22 DIAGNOSIS — E669 Obesity, unspecified: Secondary | ICD-10-CM

## 2021-01-22 DIAGNOSIS — J449 Chronic obstructive pulmonary disease, unspecified: Secondary | ICD-10-CM

## 2021-01-22 DIAGNOSIS — G629 Polyneuropathy, unspecified: Secondary | ICD-10-CM

## 2021-01-22 DIAGNOSIS — E039 Hypothyroidism, unspecified: Secondary | ICD-10-CM

## 2021-01-22 DIAGNOSIS — G4730 Sleep apnea, unspecified: Secondary | ICD-10-CM

## 2021-01-22 DIAGNOSIS — R399 Unspecified symptoms and signs involving the genitourinary system: Secondary | ICD-10-CM

## 2021-01-22 MED ORDER — CEPHALEXIN 500 MG PO CAP
500 mg | ORAL_CAPSULE | Freq: Four times a day (QID) | ORAL | 0 refills
Start: 2021-01-22 — End: ?

## 2021-01-22 MED ORDER — CEPHALEXIN 500 MG PO CAP
500 mg | ORAL_CAPSULE | Freq: Four times a day (QID) | ORAL | 0 refills | Status: AC
Start: 2021-01-22 — End: ?

## 2021-01-22 NOTE — Progress Notes
Patient presented to clinic for Urodynamic study following cysto with Dr. Penelope Coop. Timeout performed with patient. Procedure explained and consent signed. Patient tolerated well. Patient escorted from clinic in stable condition with instructions that Dr. Penelope Coop will call with results.

## 2021-01-22 NOTE — Telephone Encounter
Spoke with patient regarding post-procedure antibiotic. Patient given two doses of ciprofloxacin to take following cysto/URD. Reported drug interaction between ciprofloxacin and Zanaflex. Advised that patient discard the Cipro and instead take the Keflex that was sent to his pharmacy. Patient states understanding. No questions at this time.

## 2021-01-23 IMAGING — RF FL guided spine inject
1 series · 7 of 7 positions shown · non-contrast
Comparison: none

[Series 1: run · 4 acquisitions, 7 frames shown]
[im 1/4]
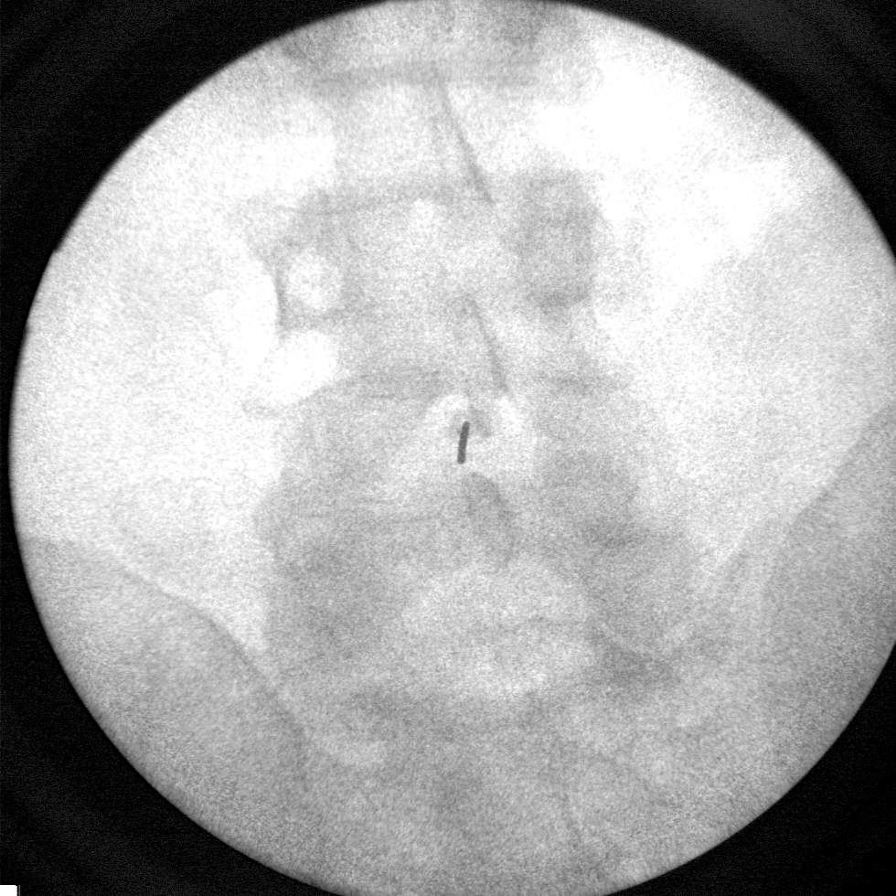
[im 2/4]
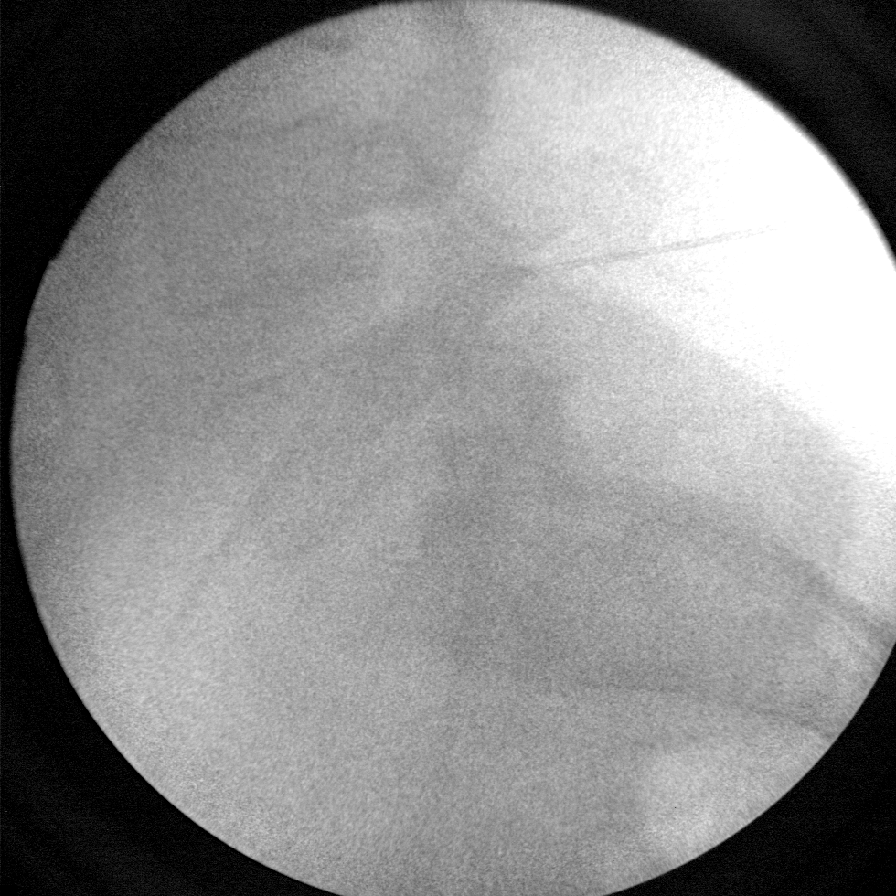
[im 3/4]
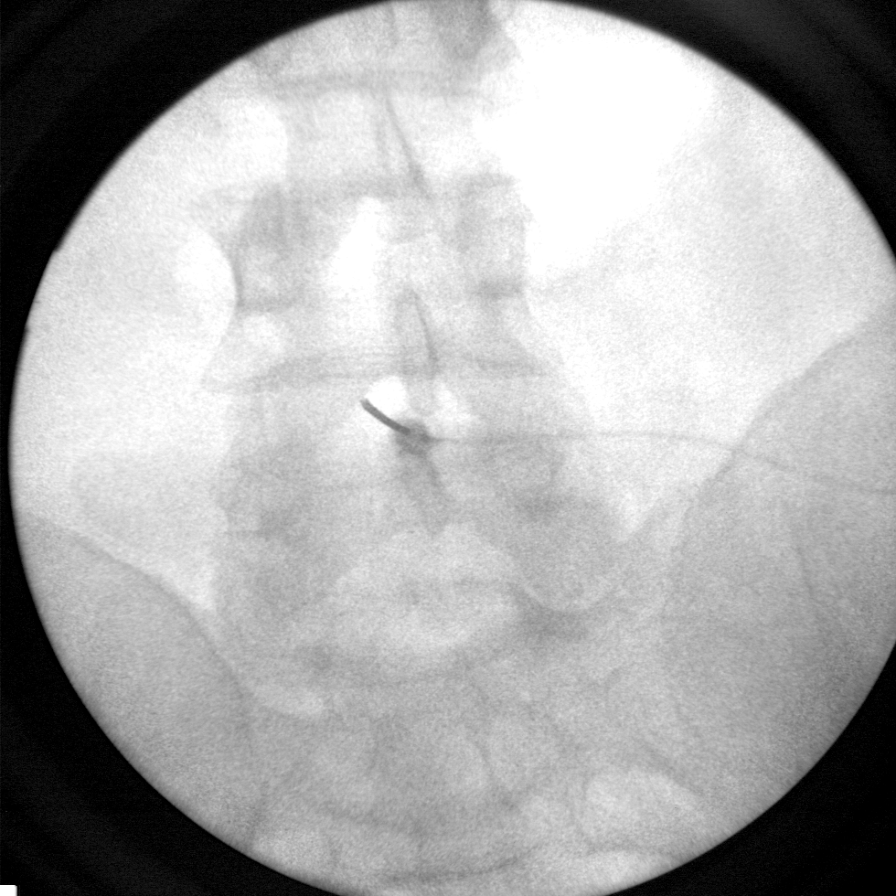
[im 4/4]
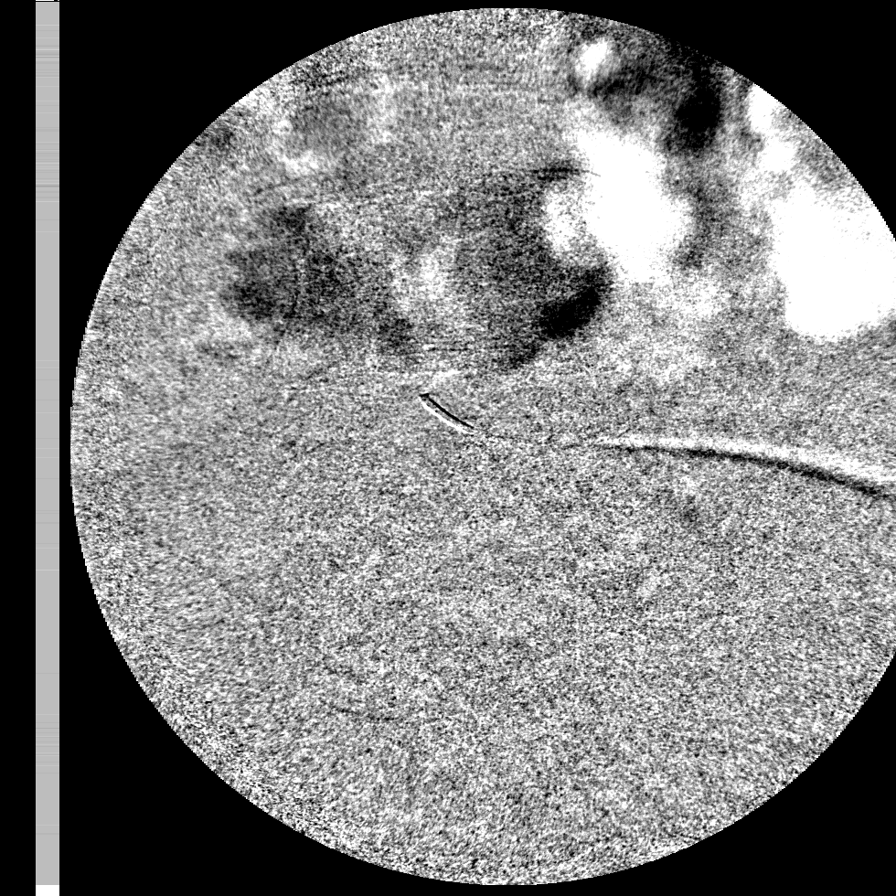
[im 4/4]
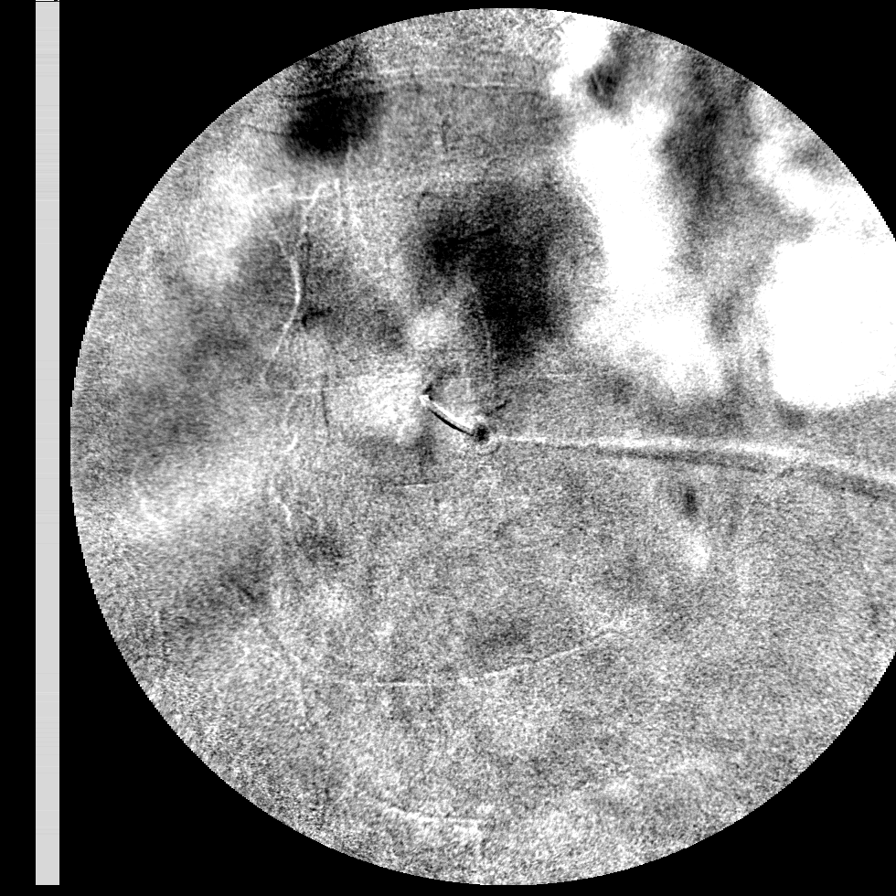
[im 4/4]
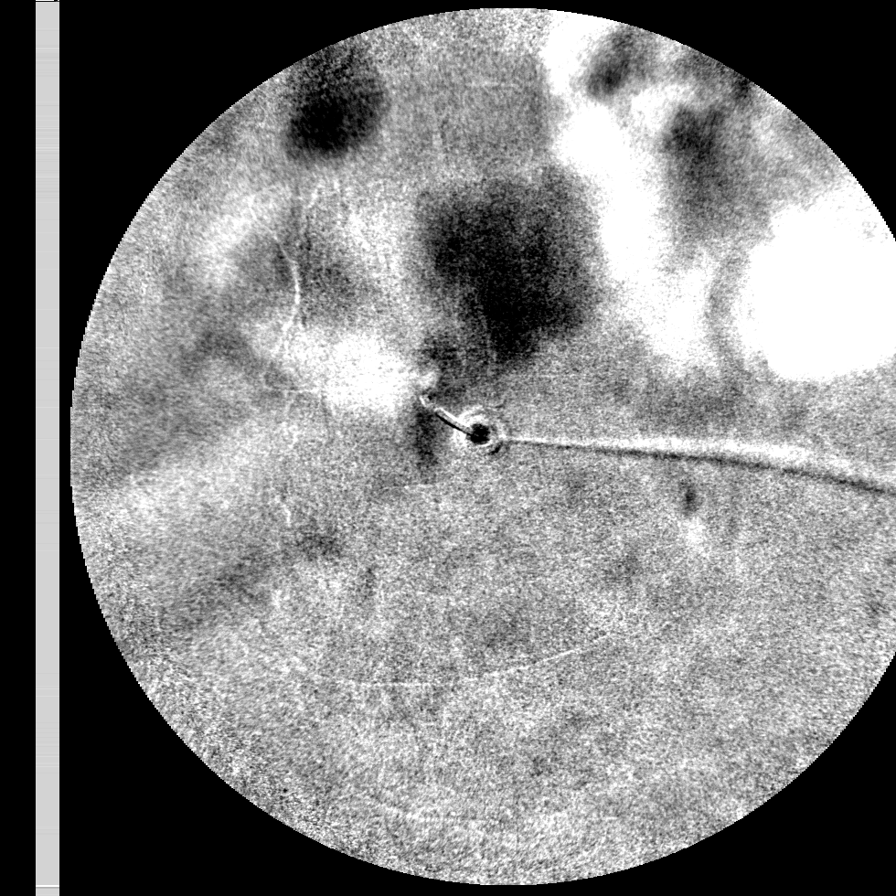
[im 4/4]
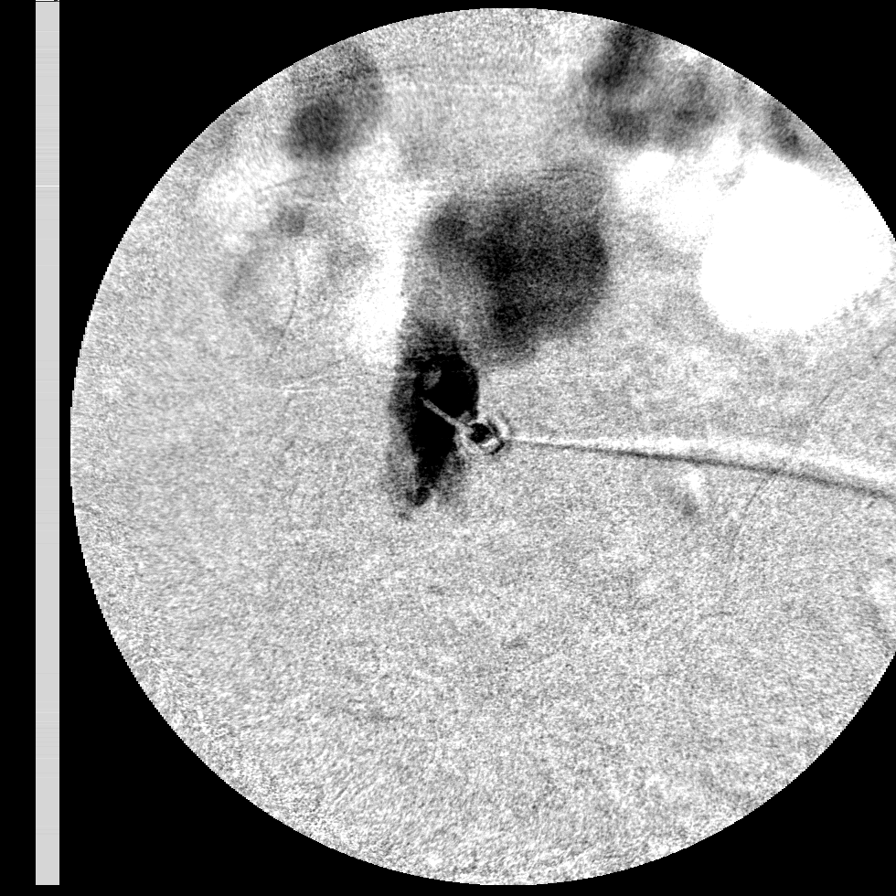

[7 of 7 positions shown; findings below may reference images not displayed]

DIAGNOSTIC STUDIES

EXAM

Fluoroscopic guidance for spinal injection.

INDICATION

CEEJAY
CEEJAY, DR PARDEDE, Fluoro time 20.2 sec, 14.4 mGy, 4 images saved.CS

TECHNIQUE

Fluoro time is 20.2 seconds. 3 spot films and cine clip were obtained.

COMPARISONS

November 20, 2020

FINDINGS

Please see procedure note for full details. Fluoroscopic guidance was provided for spinal
injection.

IMPRESSION

Fluoroscopic guidance for spinal injection.

Tech Notes:

CEEJAY, DR PARDEDE, Fluoro time 20.2 sec, 14.4 mGy, 4 images saved.CS

## 2021-01-24 ENCOUNTER — Encounter: Admit: 2021-01-24 | Discharge: 2021-01-24 | Payer: MEDICARE

## 2021-01-24 NOTE — Telephone Encounter
Attempted x 2 to call to discuss UDS results, left VM.  Please schedule Telehealth visit to review.

## 2021-01-27 ENCOUNTER — Encounter: Admit: 2021-01-27 | Discharge: 2021-01-27 | Payer: MEDICARE

## 2021-01-27 NOTE — Telephone Encounter
Patient left voicemail message returning call from Dr. Penelope Coop.    Returned patient call and transferred him to scheduling line for telehealth to discuss URD. Patient v/u.

## 2021-02-18 ENCOUNTER — Encounter: Admit: 2021-02-18 | Discharge: 2021-02-18 | Payer: MEDICARE

## 2021-02-18 MED ORDER — ZOLPIDEM 5 MG PO TAB
5 mg | ORAL_TABLET | Freq: Every evening | ORAL | 0 refills | PRN
Start: 2021-02-18 — End: ?

## 2021-02-18 MED ORDER — LORAZEPAM 1 MG PO TAB
ORAL_TABLET | Freq: Two times a day (BID) | 0 refills | PRN
Start: 2021-02-18 — End: ?

## 2021-02-18 MED ORDER — CITALOPRAM 40 MG PO TAB
ORAL_TABLET | Freq: Every day | 0 refills
Start: 2021-02-18 — End: ?

## 2021-02-26 ENCOUNTER — Encounter: Admit: 2021-02-26 | Discharge: 2021-02-26 | Payer: MEDICARE

## 2021-02-26 ENCOUNTER — Ambulatory Visit: Admit: 2021-02-26 | Discharge: 2021-02-27 | Payer: MEDICARE

## 2021-02-26 DIAGNOSIS — G629 Polyneuropathy, unspecified: Secondary | ICD-10-CM

## 2021-02-26 DIAGNOSIS — G4730 Sleep apnea, unspecified: Secondary | ICD-10-CM

## 2021-02-26 DIAGNOSIS — J449 Chronic obstructive pulmonary disease, unspecified: Secondary | ICD-10-CM

## 2021-02-26 DIAGNOSIS — R739 Hyperglycemia, unspecified: Secondary | ICD-10-CM

## 2021-02-26 DIAGNOSIS — E119 Type 2 diabetes mellitus without complications: Secondary | ICD-10-CM

## 2021-02-26 DIAGNOSIS — E669 Obesity, unspecified: Secondary | ICD-10-CM

## 2021-02-26 DIAGNOSIS — E039 Hypothyroidism, unspecified: Secondary | ICD-10-CM

## 2021-02-26 DIAGNOSIS — R7989 Other specified abnormal findings of blood chemistry: Secondary | ICD-10-CM

## 2021-02-26 DIAGNOSIS — N401 Enlarged prostate with lower urinary tract symptoms: Secondary | ICD-10-CM

## 2021-02-26 DIAGNOSIS — G4733 Obstructive sleep apnea (adult) (pediatric): Secondary | ICD-10-CM

## 2021-02-26 DIAGNOSIS — I10 Essential (primary) hypertension: Secondary | ICD-10-CM

## 2021-02-26 DIAGNOSIS — J45909 Unspecified asthma, uncomplicated: Secondary | ICD-10-CM

## 2021-02-26 NOTE — Telephone Encounter
Phoned patient to obtain consent for telehealth, check-in visit. Patient v/u.    Obtained patient's verbal consent to treat them and their agreement to [facility] financial policy and NPP via this telehealth visit during the [facility] Emergency

## 2021-02-26 NOTE — Patient Instructions
[email or web address]

## 2021-03-05 ENCOUNTER — Encounter: Admit: 2021-03-05 | Discharge: 2021-03-05 | Payer: MEDICARE

## 2021-03-05 ENCOUNTER — Ambulatory Visit: Admit: 2021-03-05 | Discharge: 2021-03-06 | Payer: MEDICARE

## 2021-03-05 DIAGNOSIS — E559 Vitamin D deficiency, unspecified: Secondary | ICD-10-CM

## 2021-03-05 DIAGNOSIS — F332 Major depressive disorder, recurrent severe without psychotic features: Secondary | ICD-10-CM

## 2021-03-05 DIAGNOSIS — F439 Reaction to severe stress, unspecified: Secondary | ICD-10-CM

## 2021-03-05 MED ORDER — HYDROXYZINE HCL 25 MG PO TAB
25 mg | ORAL_TABLET | Freq: Four times a day (QID) | ORAL | 2 refills | 30.00000 days | Status: AC | PRN
Start: 2021-03-05 — End: ?

## 2021-03-05 MED ORDER — BUPROPION XL 150 MG PO TB24
ORAL_TABLET | Freq: Every day | ORAL | 2 refills | Status: AC
Start: 2021-03-05 — End: ?

## 2021-03-05 MED ORDER — CITALOPRAM 40 MG PO TAB
40 mg | ORAL_TABLET | Freq: Every day | ORAL | 3 refills | Status: AC
Start: 2021-03-05 — End: ?

## 2021-03-05 NOTE — Progress Notes
ATTENDING NOTE  - TELEHEALTH VISIT     Encounter Date: 03/05/2021  I saw and evaluated Thomas Francis, discussed with Leticia Clas, DO and concur with the assessment and treatment plan.     PRESENTING PROBLEM AND BACKGROUND: Patient is 51 y.o. male first seen in our clinic Jan 2021 for MDD, trauma history with anxiety, referred by bariatric clinic (rejected by bariatric clinic for surgery). Intermittent contact with psychiatry including two hospitalizations 1995 for marital stress. Diagnosed bipolar in past with no clear indication of symptoms but taking divalproex off and on for several years and discontinued last visit. Divorced, living by himself, not currently working. Last visit increase dysphoria and panic attacks and divalproex resumed.     CURRENT TREATMENT AND RESPONSES: Patient reports he has been very dysphoric, continued panic attacks, fatigued, decrease energy and motivation not suicidal. He does not think divalproex helpful. He is started using C-PAP following diagnosis OSA. He feels he benefited from psychotherapy and would like to resume. He agrees with treatment plan.    PLAN:  1. Continue citalopram 20 mg qam, zolpidem 5 mg qhs, hydroxyzine 25 mg tid prn, and lorazepam 1-2 mg qd prn anxiety  2. Discontinue divalproex  3. Trial bupropion 150 mg qam  4. Encourage resumption psychotherapy - given resource infromation  5. Monitor anxiety, affect, bariatric surgery plans
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
## 2021-03-06 ENCOUNTER — Encounter: Admit: 2021-03-06 | Discharge: 2021-03-06 | Payer: MEDICARE

## 2021-03-06 IMAGING — RF FL guided spine inject
1 series · 5 of 5 positions shown · non-contrast
Comparison: none

[Series 1: run · 3 acquisitions, 5 frames shown]
[im 1/3]
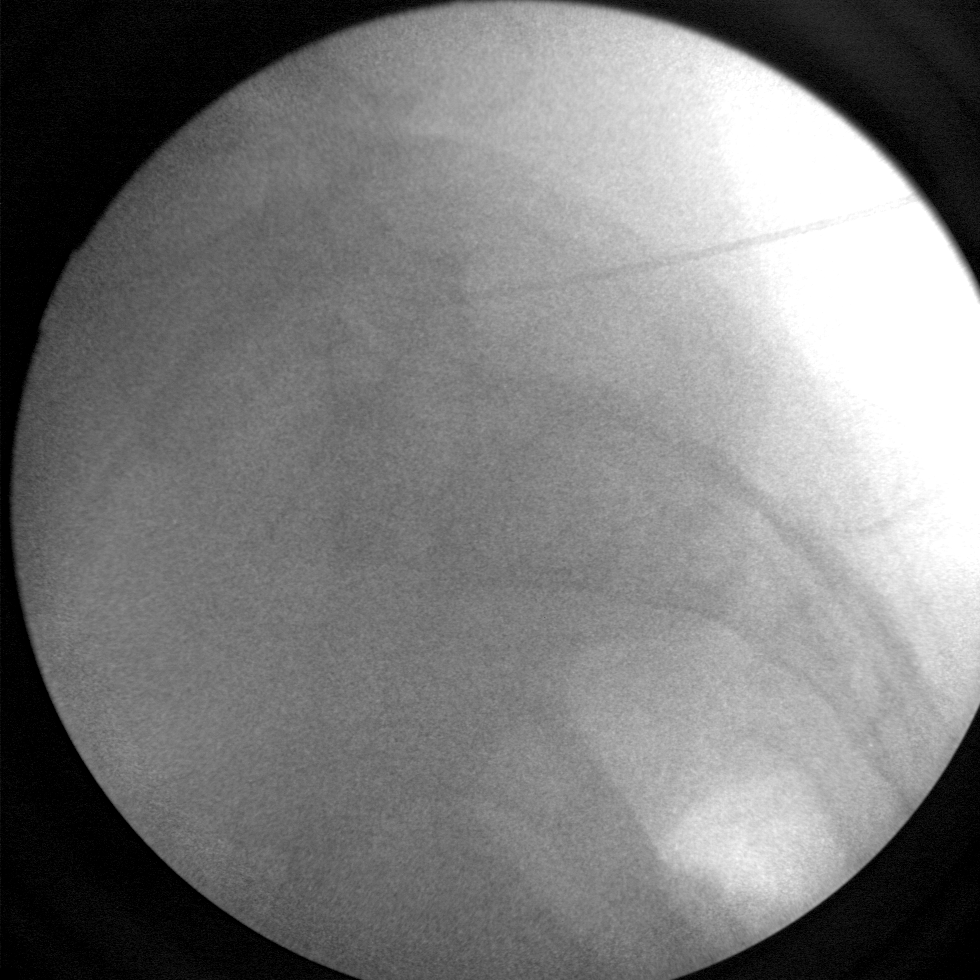
[im 2/3]
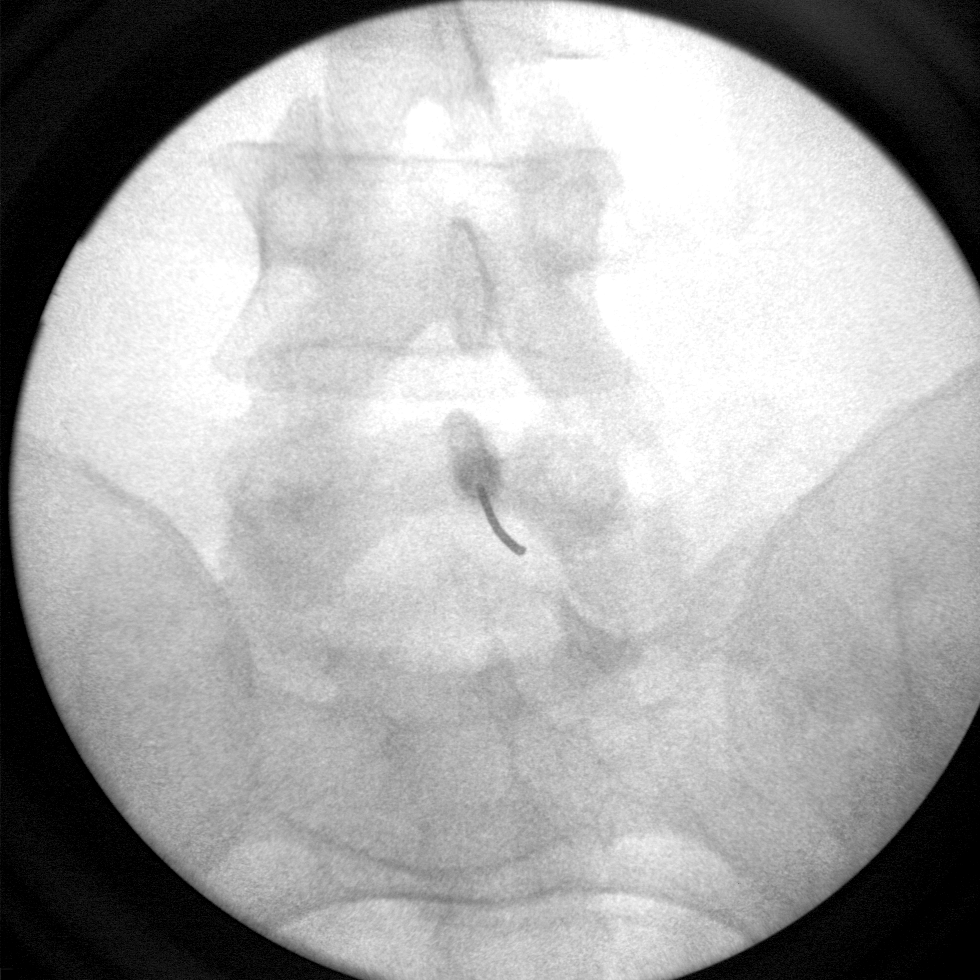
[im 3/3]
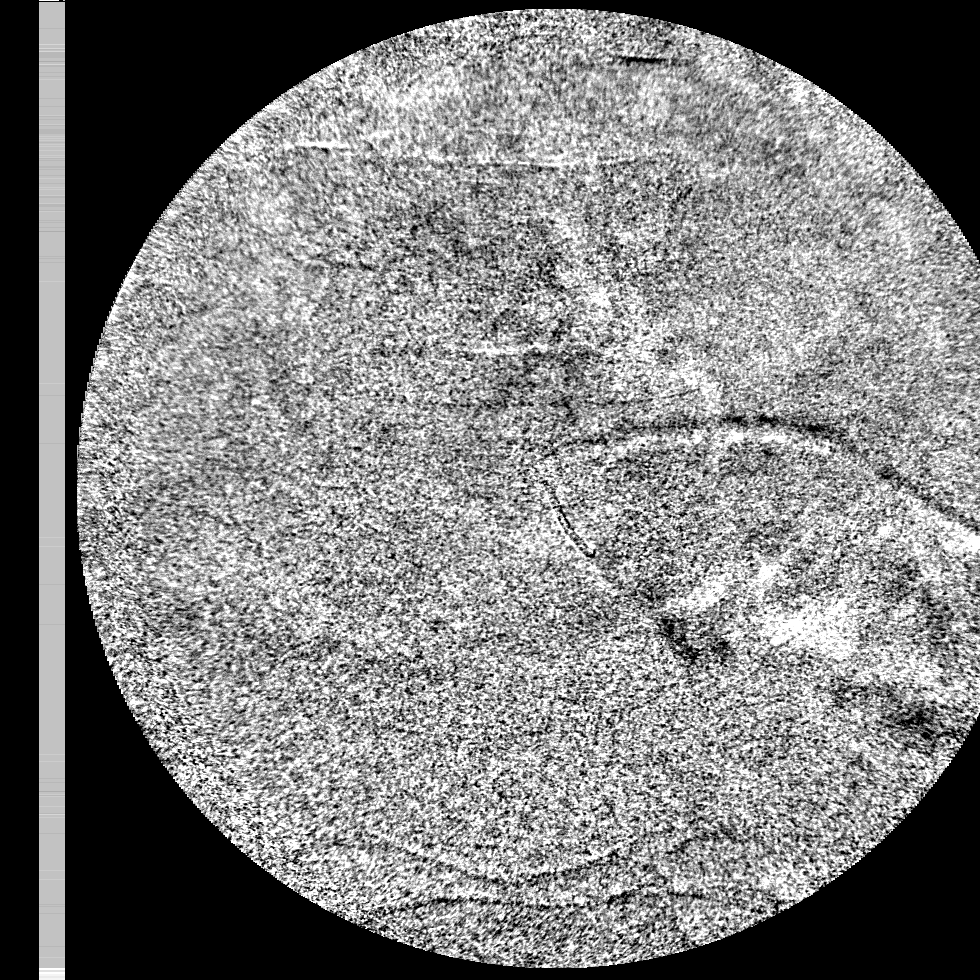
[im 3/3]
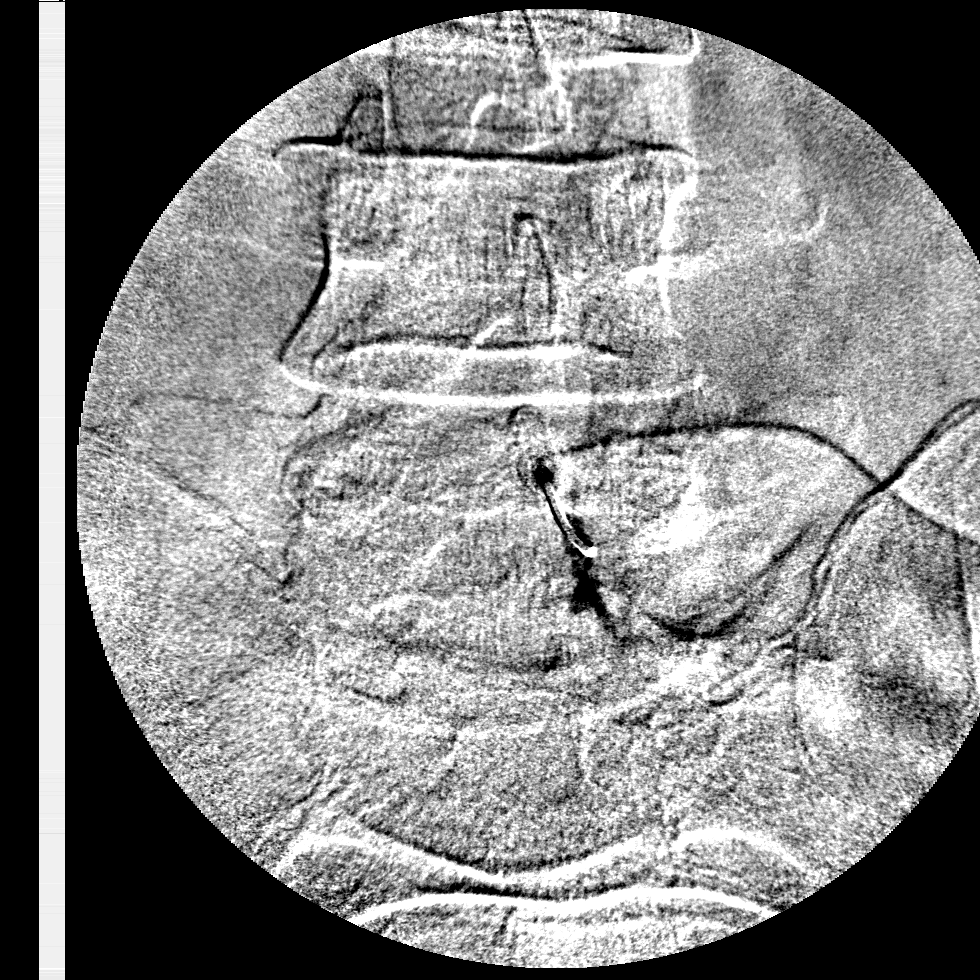
[im 3/3]
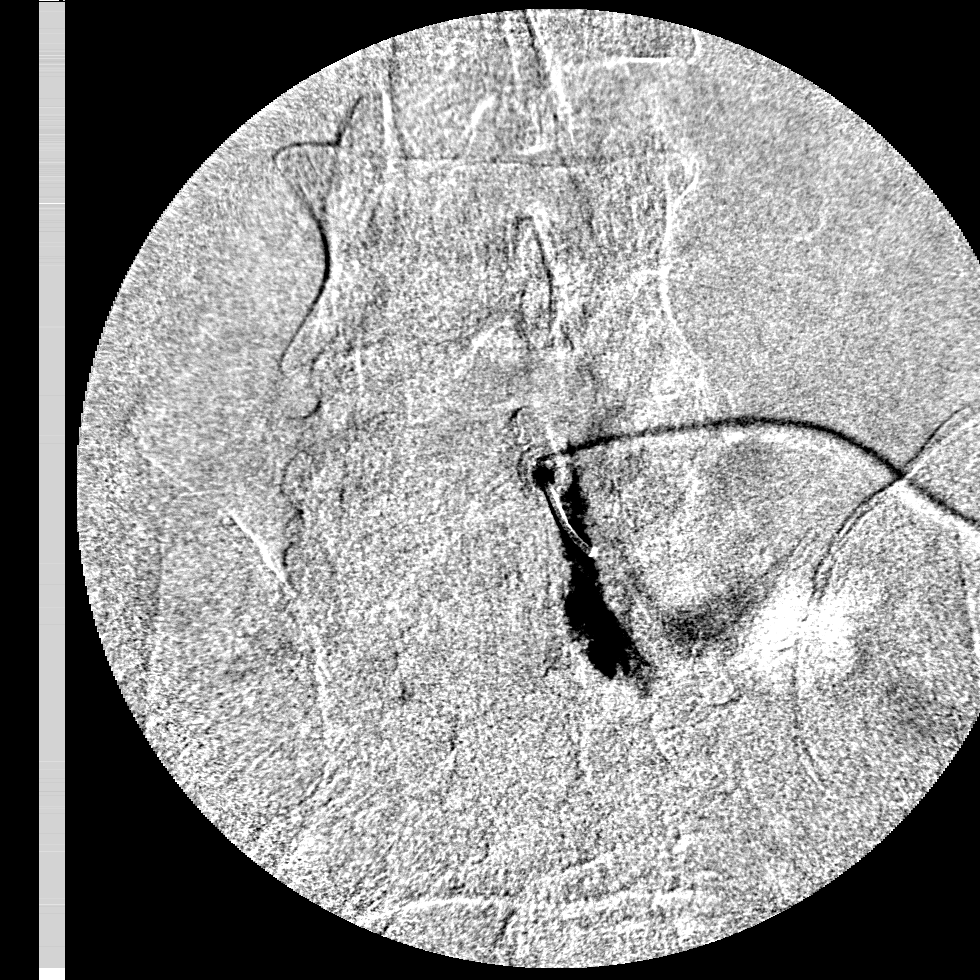

[5 of 5 positions shown; findings below may reference images not displayed]

DIAGNOSTIC STUDIES

EXAM

Fluoroscopic guidance for spinal injection.

INDICATION

NARUSHIE
NARUSHIE, OR 1 Dr Ping, Fluoro time 21.5 Sec., 14.02 mGy. CS

TECHNIQUE

Fluoro time is 21.5 seconds. 2 spot films were obtained.

COMPARISONS

January 23, 2021

FINDINGS

Please see procedure note for full details. Fluoroscopic guidance was provided for spinal
injection.

IMPRESSION

Fluoroscopic guidance for spinal injection.

Tech Notes:

NARUSHIE, OR 1 Dr Ping, Fluoro time 21.5 Sec., 14.02 mGy. CS

## 2021-03-06 MED ORDER — BUPROPION XL 300 MG PO TB24
300 mg | ORAL_TABLET | Freq: Every morning | ORAL | 2 refills
Start: 2021-03-06 — End: ?

## 2021-03-06 MED ORDER — BUPROPION XL 300 MG PO TB24
300 mg | ORAL_TABLET | Freq: Every morning | ORAL | 2 refills | Status: AC
Start: 2021-03-06 — End: ?

## 2021-03-06 NOTE — Telephone Encounter
Dr Alphonsus Sias,    Pharmacy states:  Insurance will only pay for 1 tab per day. We are filling the 150mg  for 7 days, can you please send an ERX for 300mg  tabs? Thanks

## 2021-03-10 ENCOUNTER — Encounter: Admit: 2021-03-10 | Discharge: 2021-03-10 | Payer: MEDICARE

## 2021-03-10 MED ORDER — DIVALPROEX 500 MG PO TB24
ORAL_TABLET | Freq: Every evening | 3 refills
Start: 2021-03-10 — End: ?

## 2021-03-19 ENCOUNTER — Encounter: Admit: 2021-03-19 | Discharge: 2021-03-19 | Payer: MEDICARE

## 2021-03-19 MED ORDER — DIVALPROEX 500 MG PO TB24
ORAL_TABLET | Freq: Every evening | 3 refills
Start: 2021-03-19 — End: ?

## 2021-03-20 ENCOUNTER — Encounter: Admit: 2021-03-20 | Discharge: 2021-03-20 | Payer: MEDICARE

## 2021-03-20 MED ORDER — LORAZEPAM 1 MG PO TAB
ORAL_TABLET | Freq: Two times a day (BID) | 0 refills | PRN
Start: 2021-03-20 — End: ?

## 2021-03-20 MED ORDER — ZOLPIDEM 5 MG PO TAB
5 mg | ORAL_TABLET | Freq: Every evening | ORAL | 0 refills | PRN
Start: 2021-03-20 — End: ?

## 2021-04-17 ENCOUNTER — Encounter: Admit: 2021-04-17 | Discharge: 2021-04-17 | Payer: MEDICARE

## 2021-04-17 MED ORDER — LORAZEPAM 1 MG PO TAB
ORAL_TABLET | ORAL | 0 refills | 12.00000 days | Status: AC
Start: 2021-04-17 — End: ?

## 2021-04-17 MED ORDER — ZOLPIDEM 5 MG PO TAB
5 mg | ORAL_TABLET | Freq: Every evening | ORAL | 0 refills | 30.00000 days | Status: AC | PRN
Start: 2021-04-17 — End: ?

## 2021-04-21 NOTE — Progress Notes
Outpatient Psychiatry Progress Note     Subjective:    Thomas Francis is a 51 y.o. male with a past medical history of MDD, trauma stressor disorder, benzodiazepine depdendence who presents for follow up appointment today. Thomas Francis was last seen on 03/05/2021 by Dr. Tamera Punt where Depakote was discontinued and patient was started on Wellbutrin XL 150 mg daily. Celexa 40 mg daily, Ativan 1 mg BID PRN, Ambien 5 mg QHS PRN, Hydroxyzine 25 mg QID PRN all continued.    Patient is being seen on a one-time basis and will continue to follow with their primary psychiatrist, Dr. Tamera Punt, going forward.    Patient states that since last visit he is feeling somewhat better, though remains significantly depressed.  States he has good days and bad with highs and lows.  He has noticed fewer crying spells and mood swings.  On some days he feels more active, but others he feels low energy with excessive sleep.  He continues to avoid going into public.  He notes that his home being very messy is a side of his depression and is embarrassed by this which limits his relationship with his children.  He endorses poor concentration, anhedonia, guilt.  He denies any SI/HI/AVH.    He feels that he is tolerating Wellbutrin well thus far and denies any current side effects.  He has not yet completed labs ordered at last visit, but states he will be visiting his PCP on Friday and may get them done at that time.  He discusses how his recent health struggles and childhood trauma or impacting his mental health significantly.    Social History Update:  - Single father of 2 adult children   - Has worked in Engineer, civil (consulting)  - Has dog    Substance Use Hx Update:  -Tobacco: denies  -Alcohol: denies  -Illicit substances: denies    PHQ-9  No data recorded          Review of Systems   Respiratory: Negative for shortness of breath.    Cardiovascular: Negative for chest pain.   Gastrointestinal: Negative for abdominal pain.   Neurological: Negative for headaches.   Psychiatric/Behavioral: Negative for hallucinations and suicidal ideas.       Objective:         ? albuterol sulfate (PROAIR HFA) 90 mcg/actuation HFA aerosol inhaler INHALE 2 PUFFS BY MOUTH FOUR TIMES DAILY AS NEEDED FOR SHORTNESS OF BREATH   ? alprostadil 20 mcg/mL soln IC inj(cmpd) Inject five mcg into base of penis as directed as Needed. Max 1 dose/ 24-48 hrs.   ? aspirin EC 81 mg tablet Take one tablet by mouth daily. Take with food.   ? buPROPion XL (WELLBUTRIN XL) 300 mg tablet Take one tablet by mouth every morning. Do not crush or chew.   ? carvediloL (COREG) 25 mg tablet TAKE ONE TABLET BY MOUTH TWICE DAILY WITH FOOD   ? cholecalciferol (vitamin D3) (OPTIMAL D3) 50,000 units capsule TAKE ONE CAPSULE BY MOUTH WEEKLY ON FRIDAY   ? citalopram (CELEXA) 40 mg tablet Take one tablet by mouth daily.   ? diclofenac (VOLTAREN) 1 % topical gel Apply four g topically to affected area four times daily. (Patient taking differently: Apply four g topically to affected area four times daily as needed.)   ? docusate (COLACE) 100 mg capsule Take one capsule by mouth daily.   ? empagliflozin (JARDIANCE) 10 mg tablet Take one tablet by mouth daily.   ? famotidine (PEPCID) 20  mg tablet Take two tablets by mouth daily as needed. Indications: heartburn   ? fluticasone propionate (FLONASE) 50 mcg/actuation nasal spray Apply one spray to each nostril as directed twice daily. Shake bottle gently before using.   ? fluticasone propionate (FLOVENT HFA) 110 mcg/actuation inhaler Inhale 2 puffs by mouth into the lungs twice daily.   ? FREESTYLE LIBRE 2 SENSOR sensor Use one each as directed before meals and at bedtime. Use as directed.  Indications: type 2 diabetes mellitus   ? gabapentin (NEURONTIN) 800 mg tablet Take one tablet by mouth three times daily.   ? guaiFENesin LA (MUCINEX) 600 mg tablet Take one tablet by mouth twice daily.   ? hydrOXYzine HCL (ATARAX) 25 mg tablet Take one tablet by mouth four times a day while awake as needed for Anxiety. TAKE ONE TABLET BY MOUTH FOUR TIMES DAILY AS NEEDED FOR itching/swelling/anxiety   ? insulin lispro(+) (HUMALOG) 100 unit/mL injection Inject twenty four Units under the skin three times daily before meals.   ? ipratropium bromide (ATROVENT) 0.02 % nebulizer solution Inhale 2.5 mL by mouth into the lungs four times daily as needed for Shortness of Breath or Wheezing.   ? lactobacillus rhamnosus (GG) (CULTURELLE) 10 billion cell cap Take one capsule by mouth daily with breakfast.   ? levoFLOXacin (LEVAQUIN) 500 mg tablet Take one tablet by mouth daily. (Patient taking differently: Take 500 mg by mouth as Needed.)   ? levothyroxine (SYNTHROID) 50 mcg tablet Take one tablet by mouth daily 30 minutes before breakfast.   ? lisinopril (ZESTRIL) 5 mg tablet Take one tablet by mouth daily.   ? LORazepam (ATIVAN) 1 mg tablet TAKE ONE-HALF TO ONE TABLET BY MOUTH TWICE DAILY AS NEEDED FOR SEVERE ANXIETY IS NOT RESPONSIVE TO HYDROXYZINE   ? meloxicam (MOBIC) 15 mg tablet Take one tablet by mouth daily.   ? metFORMIN-XR (GLUCOPHAGE XR) 500 mg extended release tablet Take two tablets by mouth twice daily with meals.   ? Needle (Disp) 27 G 27 gauge x 1/2 ndle Use as directed.   ? oxyCODONE-acetaminophen(+) (PERCOCET) 7.5-325 mg tablet Take one tablet to two tablets by mouth every 6 hours as needed.   ? simvastatin (ZOCOR) 20 mg tablet Take one tablet by mouth at bedtime daily.   ? syringe (disposable) 1 mL Use as directed.   ? tadalafiL (CIALIS) 5 mg tablet Take one tablet by mouth daily.   ? tamsulosin (FLOMAX) 0.4 mg capsule Take one capsule by mouth twice daily. Do not crush, chew or open capsules. Take 30 minutes following the same meal each day.   ? testosterone cypionate (DEPO-TESTOSTERONE) 200 mg/mL injection Inject 1 mL into the muscle every 14 days.   ? tiZANidine (ZANAFLEX) 4 mg tablet Take one tablet by mouth every 6 hours as needed.   ? vitamins, multiple tablet Take one tablet by mouth daily.   ? zolpidem (AMBIEN) 5 mg tablet TAKE ONE TABLET BY MOUTH AT BEDTIME AS NEEDED FOR SLEEP     There were no vitals filed for this visit.  There is no height or weight on file to calculate BMI.     Physical Exam  Psychiatric:      Comments: MENTAL STATUS EXAMINATION  General/Constitutional: appears stated age, dressed in personal clothes, fair grooming  Eye Contact: good  Behavior: Calm, cooperative; appropriate for conversation  Speech: RRR with normal volume and tone. Good articulation  Mood: a little better  Affect: euthymic ; mood congruent  Thought Process: Linear  and goal directed, very circumstantial  Thought Content: denies SI, HI. No evidence of delusions  Perception: Denies AVH  Associations: Intact  Insight/Judgment: fair/fair           Metabolic monitoring:  Metabolic monitoring:     There is no height or weight on file to calculate BMI.  Wt Readings from Last 3 Encounters:   01/22/21 135.5 kg (298 lb 12.8 oz)   11/22/20 (!) 139.6 kg (307 lb 12.8 oz)   10/29/20 (!) 139.3 kg (307 lb)     BP Readings from Last 3 Encounters:   01/22/21 93/68   11/22/20 112/70   10/29/20 105/60     Lab Results   Component Value Date    CHOL 216 (H) 01/09/2019    TRIG 671 (H) 01/09/2019    HDL 46 01/09/2019    LDL 99 01/09/2019    VLDL 161 01/09/2019    NONHDLCHOL 170 01/09/2019     Hemoglobin A1C   Date Value Ref Range Status   06/25/2017 8.8 (H) 4.0 - 6.0 % Final     Comment:     The ADA recommends that most patients with type 1 and type 2 diabetes maintain   an A1c level <7%.         AIMS  No data recorded      Assessment and Plan:  Thomas Francis is a 51 y.o. male with the below diagnoses. Today, patient presents with mild improvements in mood since last visit where he was started on Wellbutrin.  He is tolerating this medication well without side effects. Treatment plan includes increase Wellbutrin and refer for CPT.    1. Severe episode of recurrent major depressive disorder, without psychotic features (HCC)    2. Trauma and stressor-related disorder    3. Benzodiazepine dependence (HCC)      Plan:  -Increase Wellbutrin XL to 300 mg daily for mood  -Therapy referral entered for CPT 04/22/2021    -Continue all other current medications without change:   - Citalopram 40 mg daily   - Lorazepam 1 mg twice daily as needed anxiety   - Zolpidem 5 mg nightly as needed insomnia   - Hydroxyzine 25 mg 4 times daily as needed anxiety    Discussion  1. The proposed treatment plan was discussed with the patient who was provided the opportunity to actively take part finalizing the current treatment plan.   ? Discussed risks, benefits and potential side effects of medications with patient and guardian as well as alternative treatments  ? Discussed relevant black box warnings   ? patient reported understanding of the risks, benefits and possible side effects and provided consent for treatment unless otherwise stated  2. Discussed contingency/emergency plans with the patient should there be concern for attempting suicide, committing self-harm or other potential injuries to self or others  1. These plans include:  ? Contacting the mental health clinic (calling, walk-in, etc.)  ? Calling 911 or calling the crisis line  ? Visiting the ER at any time    Return to clinic 07/04/21 with Dr. Tamera Punt    Patient discussed with Dr. Lajean Saver    Safety plan discussed at length and outlined in detail on after visit summary    The proposed treatment plan was discussed with the patient/guardian who was provided the opportunity to ask questions and make suggestions regarding alternative treatment.     This note was composed with assistance of Office manager.  There may be transcriptional errors.  If you have questions regarding the note please reach out to Dr. Wilma Flavin directly

## 2021-04-21 NOTE — Patient Instructions
Plan from today's visit:  Increase Wellbutrin XL to 300 mg daily for mood  Continue all other current medications    Please return to clinic in 2 months.  Please call the clinic to reschedule or cancel appointments if somethings changes.     If you need a medication refill, please call your pharmacy to request refills.     If you need to contact the clinic:  Clinic hours: 8 am-5 pm M-F  Clinic phone number: [phone]  Clinic fax number: [phone]  Send MyChart message    Emergency Resources:  In the event of a safety concern or suicidal thoughts, call 911 or go to the nearest emergency room.   The National Suicide Prevention Lifeline is now 11 as of 08/31/20  The Crisis Text Hotline (text (303)354-5836)     Other Resources:  The Compassionate Ear phone line  Peer-run listening service that provides non-crisis supportive listening, coping strategies, information and a reprieve from loneliness or isolation  Phone: 740-240-0724  Open 9a - 9p daily, including holidays  National Alliance on Mental Illness: https://www.nami.org   Substance Abuse and Mental Health Services Administration: BirthdayFever.at   National human trafficking hotline: 607-620-7639 or text 909-543-0030  [employer] Violence Hotline: 1- 9296973558      The [employer] - Turning Point Program:  Classes, programs and tools that empower and inspire people affected by chronic or serious illness  Support groups, meditation, creative projects, nutrition groups, exercise, and many others  Every program or class is free of charge and is supported entirely by generous donors - founded in 2001  Online classes/programs available  Register for programs at least 48 hours in advance online, by e-mail, or phone    Website: https://www.[facility].com/health-resources/turning-point  Email: Santa Genera - [email or web address]@[email or web address] .[email or web address]  Phone: 224-832-5837      How to get started with therapy:  To start therapy at [facility], you can ask the front desk for a therapy packet; please complete this and return it to the clinic via mail or in person. You will be contacted for scheduling. In person and telehealth options are available,  If you would like to do therapy outside of [facility], there are several options:  Call your health insurance provider and ask for a list of covered providers in your area  [facility])   www.kcanxiety.com  [facility] Program - [employer] and Assessment Services  Phone: 5591751832  https://education.MobCommunity.ch   Sliding scale fees  www.psychologytoday.com  Filters for zip code, issues addressed, insurance, type of therapy, language, sexuality, race, faith  Online options:  SunReplacement.co.uk  https://www.talkspace.com    Community mental health centers:  [facility]  [address] [place], [place], [place] [address]  Phone: [phone]  http://www.wyandotcenter.org/  [facility]  [address] [place], [place] [address] [facility] office)  [address] [place], [place], [place] [address] ([place] office)  Phone: [phone]  UFOFinder.fi  [facility])  [phone] Dr. Beatris Si [address], [place] [address]  [phone]  SodaFlavors.dk  [facility])  [address] [place], [place] [address]  Phone: [phone]  https://theguidance-ctr.[email or web address]  [facility] [place])  [address], [place], [place] [address]  Phone: [phone]  https://www.[email or web address]-[email or web address]  [facility])  [address], [place] [address]  Phone: [phone]  [email or web address]-[email or web address]   [facility] [place])  [phone]  [address], [place], [place] [address]; phone: [phone] [facility] office)  [address] [place], [place], [place] [address]; phone: [phone] [facility] office)  [email or web address].[email or web address]    Intensive Outpatient Programs:   Sleep at home, but have intensive therapy multiple per week  Treatment for:  Drug or alcohol abuse or addiction   Depression   Stress and anxiety disorders   Trauma and post-traumatic stress disorder (PTSD)   Mood disorders   Personality disorders   [facility]   [address] [place], [place] [address]  [phone]  [email or web address]  [facility]  [address], [place] [address]  [phone]  EliteClients.be  [facility]  [address] [place], [place] [address]  [phone]  CyclingMonthly.ch

## 2021-04-22 ENCOUNTER — Encounter: Admit: 2021-04-22 | Discharge: 2021-04-22 | Payer: MEDICARE

## 2021-04-22 ENCOUNTER — Ambulatory Visit: Admit: 2021-04-22 | Discharge: 2021-04-23 | Payer: MEDICARE

## 2021-04-22 DIAGNOSIS — F439 Reaction to severe stress, unspecified: Secondary | ICD-10-CM

## 2021-04-22 DIAGNOSIS — F332 Major depressive disorder, recurrent severe without psychotic features: Secondary | ICD-10-CM

## 2021-04-22 DIAGNOSIS — F1320 Sedative, hypnotic or anxiolytic dependence, uncomplicated: Secondary | ICD-10-CM

## 2021-04-22 MED ORDER — BUPROPION XL 300 MG PO TB24
300 mg | ORAL_TABLET | Freq: Every morning | ORAL | 2 refills | Status: AC
Start: 2021-04-22 — End: ?

## 2021-04-22 NOTE — Progress Notes
ATTENDING NOTE  I saw and evaluated Thomas Francis, discussed with Wilma Flavin, MD and concur with the assessment and treatment plan. Patient is 51 y.o. 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 Pt reports increased symptoms of depression. Denies SI/HI and AVH and no other safety concerns. Pt reports no medication side effects.    PLAN:  The following medication changes were made during this visit to better treat the above symptoms:  1. Continue Celexa 40mg  PO Daily  2. Increrase Wellbutrin XL to 300mg  PO Daily  3. Continue Hydroxyzine 25mg  PO QID PRN  4. Continue Ativan 1mg  PO BID PRN  5. Continue Ambien 5mg  PO QHS PRN  6. No labs needed    ? albuterol sulfate (PROAIR HFA) 90 mcg/actuation HFA aerosol inhaler INHALE 2 PUFFS BY MOUTH FOUR TIMES DAILY AS NEEDED FOR SHORTNESS OF BREATH   ? alprostadil 20 mcg/mL soln IC inj(cmpd) Inject five mcg into base of penis as directed as Needed. Max 1 dose/ 24-48 hrs.   ? aspirin EC 81 mg tablet Take one tablet by mouth daily. Take with food.   ? buPROPion XL (WELLBUTRIN XL) 300 mg tablet Take one tablet by mouth every morning. Do not crush or chew.   ? carvediloL (COREG) 25 mg tablet TAKE ONE TABLET BY MOUTH TWICE DAILY WITH FOOD   ? cholecalciferol (vitamin D3) (OPTIMAL D3) 50,000 units capsule TAKE ONE CAPSULE BY MOUTH WEEKLY ON FRIDAY   ? citalopram (CELEXA) 40 mg tablet Take one tablet by mouth daily.   ? diclofenac (VOLTAREN) 1 % topical gel Apply four g topically to affected area four times daily. (Patient taking differently: Apply four g topically to affected area four times daily as needed.)   ? docusate (COLACE) 100 mg capsule Take one capsule by mouth daily.   ? empagliflozin (JARDIANCE) 10 mg tablet Take one tablet by mouth daily.   ? famotidine (PEPCID) 20 mg tablet Take two tablets by mouth daily as needed. Indications: heartburn   ? fluticasone propionate (FLONASE) 50 mcg/actuation nasal spray Apply one spray to each nostril as directed twice daily. Shake bottle gently before using.   ? fluticasone propionate (FLOVENT HFA) 110 mcg/actuation inhaler Inhale 2 puffs by mouth into the lungs twice daily.   ? FREESTYLE LIBRE 2 SENSOR sensor Use one each as directed before meals and at bedtime. Use as directed.  Indications: type 2 diabetes mellitus   ? gabapentin (NEURONTIN) 800 mg tablet Take one tablet by mouth three times daily.   ? guaiFENesin LA (MUCINEX) 600 mg tablet Take one tablet by mouth twice daily.   ? hydrOXYzine HCL (ATARAX) 25 mg tablet Take one tablet by mouth four times a day while awake as needed for Anxiety. TAKE ONE TABLET BY MOUTH FOUR TIMES DAILY AS NEEDED FOR itching/swelling/anxiety   ? insulin lispro(+) (HUMALOG) 100 unit/mL injection Inject twenty four Units under the skin three times daily before meals.   ? ipratropium bromide (ATROVENT) 0.02 % nebulizer solution Inhale 2.5 mL by mouth into the lungs four times daily as needed for Shortness of Breath or Wheezing.   ? lactobacillus rhamnosus (GG) (CULTURELLE) 10 billion cell cap Take one capsule by mouth daily with breakfast.   ? levoFLOXacin (LEVAQUIN) 500 mg tablet Take one tablet by mouth daily. (Patient taking differently: Take 500 mg by mouth as Needed.)   ? levothyroxine (SYNTHROID) 50 mcg tablet Take one tablet by mouth daily 30 minutes before breakfast.   ?  lisinopril (ZESTRIL) 5 mg tablet Take one tablet by mouth daily.   ? LORazepam (ATIVAN) 1 mg tablet TAKE ONE-HALF TO ONE TABLET BY MOUTH TWICE DAILY AS NEEDED FOR SEVERE ANXIETY IS NOT RESPONSIVE TO HYDROXYZINE   ? meloxicam (MOBIC) 15 mg tablet Take one tablet by mouth daily.   ? metFORMIN-XR (GLUCOPHAGE XR) 500 mg extended release tablet Take two tablets by mouth twice daily with meals.   ? Needle (Disp) 27 G 27 gauge x 1/2 ndle Use as directed.   ? oxyCODONE-acetaminophen(+) (PERCOCET) 7.5-325 mg tablet Take one tablet to two tablets by mouth every 6 hours as needed.   ? simvastatin (ZOCOR) 20 mg tablet Take one tablet by mouth at bedtime daily.   ? syringe (disposable) 1 mL Use as directed.   ? tadalafiL (CIALIS) 5 mg tablet Take one tablet by mouth daily.   ? tamsulosin (FLOMAX) 0.4 mg capsule Take one capsule by mouth twice daily. Do not crush, chew or open capsules. Take 30 minutes following the same meal each day.   ? testosterone cypionate (DEPO-TESTOSTERONE) 200 mg/mL injection Inject 1 mL into the muscle every 14 days.   ? tiZANidine (ZANAFLEX) 4 mg tablet Take one tablet by mouth every 6 hours as needed.   ? vitamins, multiple tablet Take one tablet by mouth daily.   ? zolpidem (AMBIEN) 5 mg tablet TAKE ONE TABLET BY MOUTH AT BEDTIME AS NEEDED FOR SLEEP       Rae Mar, MD  04/22/2021

## 2021-05-02 ENCOUNTER — Encounter: Admit: 2021-05-02 | Discharge: 2021-05-02 | Payer: MEDICARE

## 2021-05-02 NOTE — Progress Notes
Thomas Francis. M[place], [birth year] has an appointment with PA Clementeen Graham on 05/09/21.    Please send recent lab results for continuity of care.    Thank you,   Thomas Francis    Phone: [phone]  Fax: [phone]

## 2021-05-09 ENCOUNTER — Encounter: Admit: 2021-05-09 | Discharge: 2021-05-09 | Payer: MEDICARE

## 2021-05-09 ENCOUNTER — Ambulatory Visit: Admit: 2021-05-09 | Discharge: 2021-05-10 | Payer: MEDICARE

## 2021-05-09 DIAGNOSIS — G4733 Obstructive sleep apnea (adult) (pediatric): Secondary | ICD-10-CM

## 2021-05-09 DIAGNOSIS — G629 Polyneuropathy, unspecified: Secondary | ICD-10-CM

## 2021-05-09 DIAGNOSIS — E1165 Type 2 diabetes mellitus with hyperglycemia: Secondary | ICD-10-CM

## 2021-05-09 DIAGNOSIS — E039 Hypothyroidism, unspecified: Secondary | ICD-10-CM

## 2021-05-09 DIAGNOSIS — N1831 Stage 3a chronic kidney disease (HCC): Secondary | ICD-10-CM

## 2021-05-09 DIAGNOSIS — I10 Essential (primary) hypertension: Secondary | ICD-10-CM

## 2021-05-09 DIAGNOSIS — E669 Obesity, unspecified: Secondary | ICD-10-CM

## 2021-05-09 DIAGNOSIS — J449 Chronic obstructive pulmonary disease, unspecified: Secondary | ICD-10-CM

## 2021-05-09 DIAGNOSIS — J45909 Unspecified asthma, uncomplicated: Secondary | ICD-10-CM

## 2021-05-09 DIAGNOSIS — R7989 Other specified abnormal findings of blood chemistry: Secondary | ICD-10-CM

## 2021-05-09 DIAGNOSIS — E119 Type 2 diabetes mellitus without complications: Secondary | ICD-10-CM

## 2021-05-09 DIAGNOSIS — G4730 Sleep apnea, unspecified: Secondary | ICD-10-CM

## 2021-05-09 MED ORDER — CARVEDILOL 25 MG PO TAB
12.5 mg | ORAL_TABLET | Freq: Two times a day (BID) | ORAL | 3 refills | 90.00000 days | Status: AC
Start: 2021-05-09 — End: ?

## 2021-05-09 MED ORDER — LISINOPRIL 2.5 MG PO TAB
2.5 mg | ORAL_TABLET | Freq: Every day | ORAL | 3 refills | Status: AC
Start: 2021-05-09 — End: ?

## 2021-05-09 NOTE — Progress Notes
Date of Service: 05/09/2021    Thomas Francis is a 51 y.o. male.       HPI     I had the pleasure of seeing Thomas Francis for a follow up visit in the Cardiovascular Medicine clinic at [facility].     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  He is followed by Dr. Neale Burly.     Thomas Francis underwent a cardiac work up in 2020 for evaluation of chest pain. His regadenoson MPI on 07/07/18 showed a mildly depressed LV function EF of 42%, there was evidence of a inferior wall defect consistent with RCA ischemia.  He had a subsequent echocardiogram showing preserved LV function, EF of 55%.  No significant valvular abnormality.  Due to his abnormal stress test he was referred for cardiac cath on 07/14/18 showing normal coronary angiography, mild elevated LVEDP of 18 mmHg.  He was continued on medical therapy.     He was seen by Dr. Neale Burly on 10/23/20 without concerning symptoms. He was noted to have a new RBBB on ECG.  It was recommended to repeat an echocardiogram to assess for structural disease. This was obtained on 11/22/20 showing normal LV function, EF 50-55%, no RWMA, mild dilated RA, and no significant valvular disease.     Thomas Francis remains doing well without concerning symptoms.  He has lost 116 pounds since his bariatric surgery.  He started off with 407 pounds and is currently at 291.  He was seen by Dr. Suan Halter several weeks ago and was noted had elevated liver functions with an AST of 191, ALP of 174.  He was taken off simvastatin with a repeat today showing significant improvement in his AST of 66, ALP of 112.  He has been swimming regularly but injured his left shoulder last week.  He is undergoing physical therapy and was placed on Zanaflex.  He denies presyncope or syncopal episodes.         Vitals:    05/09/21 1446   BP: 98/62   BP Source: Arm, Right Upper   Pulse: 90   SpO2: 95% PainSc: Zero   Weight: 132 kg (291 lb 1.6 oz)   Height: 170.2 cm (5' 7)     Body mass index is 45.59 kg/m?Marland Kitchen     Past Medical History  Patient Active Problem List    Diagnosis Date Noted   ? Erectile dysfunction 10/29/2020   ? RBBB 10/23/2020     2D Doppler Echo 11/22/20 --difficult study due to poor acoustic windows due to body habitus.  ? LV normal size, concentric remodeling.    ? LVSF normal, estimated 50-55%.     ? Normal DV  ? RV mildly dilated, preserved systolic function.  ? Mildly dilated RA.  Normal-sized LA.  ? No valvular disease identified, No PE  ? inadequate TR jet, unable to estimate PASP    ? Elevated CVP 5-10 mmHg.  ?Prior study dated 07/14/2018: limited images, no clinically significant interval change.     ? Severe episode of recurrent major depressive disorder, without psychotic features (HCC) 05/04/2019   ? 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 Peripheral neuropathy 09/07/2018   ? Chronic kidney disease (CKD) 09/07/2018   ? 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     ? Abnormal stress test 07/14/2018   ? Chest discomfort 06/28/2018     07/07/18- Regadeonson Thallium MPI:  EF 42 %. This study is technically difficult given patient's extreme body habitus.  However it does appear abnormal. There is a small sized mild intensity reversible perfusion abnormality seen in the inferior and inferolateral segments from the base to the mid ventricular cavity.  This is consistent with ischemia in the right coronary artery distribution.    07/14/18- Echo: EF 55%. Normal LV diastolic functionNormal RV size and function. Suboptimal visualization of the valve, however no significant valvular abnormality on color Doppler.Normal CVP & PA systolic pressure 33 mmHg.  Compared to prior TEE study dated 06/02/2017, LVEF is similar, no significant valvular abnormality was seen in the previous study and current study  07/14/18- Left Heart Catheterization: Normal coronary arteries with no evidence of any significant atherosclerosis     ? ATN (acute tubular necrosis) (HCC) 06/29/2017   ? Acute diffuse otitis externa of right ear 06/29/2017   ? Sleep apnea 06/29/2017   ? Bacteremia 06/17/2017     06/02/2017- TEE: EF of 60%. Normal right ventricular size and function. No significant valvular disease identified.No pericardial effusion. No evidence of endocarditis. No evidence of thrombus in the left atrium or left atrial appendage. Small patent foramen ovale (No atrial septal aneurysm)       ? Acute kidney injury (HCC) 06/17/2017   ? Dyspnea 06/17/2017   ? S/P TURP 06/17/2017   ? COPD (chronic obstructive pulmonary disease) (HCC) 06/17/2017   ? Essential hypertension 05/28/2017     06/19/2017- Rega MPI: EF 66%. This study is normal and represents low probability for significant inducible myocardial ischemia. There are no perfusion abnormalities identified on this study. All myocardial segments appear viable. Regional and global left ventricular function are normal. High risk scintigraphic features are absent.      ? Chronic low back pain 05/28/2017   ? Hyperglycemia without ketosis 05/28/2017   ? Type 2 diabetes mellitus (HCC) 05/27/2017         Review of Systems   Constitutional: Negative.   HENT: Negative.    Eyes: Negative.    Cardiovascular: Negative.    Respiratory: Negative.    Endocrine: Negative.    Hematologic/Lymphatic: Negative.    Skin: Negative.    Gastrointestinal: Negative.    Genitourinary: Negative.    Neurological: Negative.    Psychiatric/Behavioral: Negative.    Allergic/Immunologic: Negative.        Physical Exam   General Appearance: no acute distress  Skin: warm & intact  HEENT: unremarkable  Neck Veins: neck veins are flat & not distended  Carotid Arteries: no bruits  Chest Inspection: chest is normal in appearance  Auscultation/Percussion: lungs clear to auscultation, no rales, rhonchi, or wheezing  Cardiac Rhythm: regular rhythm & normal rate  Cardiac Auscultation: Normal S1 & S2, no S3 or S4, no rub  Murmurs: no cardiac murmurs   Extremities: no lower extremity edema; 2+ symmetric distal pulses  Abdominal Exam: soft, non-tender, no masses, bowel sounds normal  Liver & Spleen: no organomegaly  Neurologic Exam: oriented to time, place and person; no focal neurologic deficits  Psychiatric: Normal mood and affect.  Behavior is normal. Judgment and thought content normal.       Cardiovascular Health Factors  Vitals  BP Readings from Last 3 Encounters:   05/09/21 98/62   01/22/21 93/68   11/22/20 112/70     Wt Readings from Last 3 Encounters:   05/09/21 132 kg (291 lb 1.6 oz)   01/22/21 135.5 kg (298 lb 12.8 oz)   11/22/20 (!) 139.6 kg (307 lb 12.8 oz)     BMI Readings from Last 3 Encounters:   05/09/21 45.59 kg/m?   01/22/21 48.23 kg/m?   11/22/20 48.21 kg/m?      Smoking Social History     Tobacco Use   Smoking Status Former   ? Types: Cigarettes   Smokeless Tobacco Current   ? Types: Chew      Lipid Profile Cholesterol   Date Value Ref Range Status   01/09/2019 216 (H) <200 MG/DL Final     HDL   Date Value Ref Range Status   01/09/2019 46 >40 MG/DL Final     LDL   Date Value Ref Range Status   01/09/2019 99 <100 mg/dL Final     Triglycerides   Date Value Ref Range Status   01/09/2019 671 (H) <150 MG/DL Final      Blood Sugar Hemoglobin A1C   Date Value Ref Range Status   06/25/2017 8.8 (H) 4.0 - 6.0 % Final     Comment:     The ADA recommends that most patients with type 1 and type 2 diabetes maintain   an A1c level <7%.       Glucose   Date Value Ref Range Status   01/09/2019 160 (H) 70 - 100 MG/DL Final   45/40/9811 914 (H) 70 - 100 MG/DL Final   78/29/5621 308 (H) 90 - 105 Final     Glucose, POC   Date Value Ref Range Status   01/20/2019 133 (H) 70 - 100 MG/DL Final   65/78/4696 295 (H) 70 - 100 MG/DL Final   28/41/3244 010 (H) 70 - 100 MG/DL Final     Glucose POC   Date Value Ref Range Status   01/17/2019 315 (A) 65 - 110 mg/dL Final   27/25/3664 403 (A) 65 - 110 mg/dL Final          Problems Addressed Today  Encounter Diagnoses   Name Primary?   ? Stage 3a chronic kidney disease (HCC) Yes   ? Class 3 severe obesity due to excess calories with serious comorbidity and body mass index (BMI) of 60.0 to 69.9 in adult [facility])    ? Essential hypertension    ? Type 2 diabetes mellitus with hyperglycemia, with long-term current use of insulin (HCC)    ? Obstructive sleep apnea syndrome        Assessment and Plan     1.  Primary hypertension.  His blood pressure is soft today at 98/162.  I suspect that this is due to significant weight loss as well as recent Zanaflex initiation.  I have reduced carvedilol from 25 mg twice a day to 12.5 mg twice a day.  Lisinopril was reduced from 5 mg to 2.5 mg.  I would like to continue low-dose ACE inhibitor given his diabetic status.    2.  Recent elevated liver enzymes, significant improvement off statin therapy.  We will obtain his recent lipid panel per Dr. Suan Halter.  Given his normal coronary angiography I suspect lifestyle modification will be appropriate.  He was noted to had an elevated triglyceride of 671 back in 2020, and is followed by his PCP.  3.  New right bundle branch block in September 2020.  He has normal coronary angiography per cardiac catheterization in May 2020.  He had a repeat echocardiogram for further evaluation on November 17, 2020 showing preserved LV function, mild dilated RV with a otherwise no significant valvular disease.      4.  Mixed dyslipidemia.  He is currently off statin due to recent elevated LFTs.  Continue management per PCP.    5.  Diabetic mellitus type II.  He is currently on metformin 1000 mg twice a day, Jardiance 10 mg and insulin regimen.  He continues to follow with Dr. Golden Pop for glycemic control.    6.  OSA compliant with CPAP    7.  History of CKD.  His BMP today shows stable creatinine 1.07, potassium of 4.0.    I will see him back in 3 months as he continues to lose weight for adjustment in antihypertensive agents.      Thank you for allowing me to participate in Mr. Kamai care.  Please feel free to contact me if I can be of further assistance.    Roberts Gaudy PA-c             Current Medications (including today's revisions)  ? albuterol sulfate (PROAIR HFA) 90 mcg/actuation HFA aerosol inhaler INHALE 2 PUFFS BY MOUTH FOUR TIMES DAILY AS NEEDED FOR SHORTNESS OF BREATH   ? alprostadil 20 mcg/mL soln IC inj(cmpd) Inject five mcg into base of penis as directed as Needed. Max 1 dose/ 24-48 hrs.   ? aspirin EC 81 mg tablet Take one tablet by mouth daily. Take with food.   ? buPROPion XL (WELLBUTRIN XL) 300 mg tablet Take one tablet by mouth every morning. Do not crush or chew.   ? carvediloL (COREG) 25 mg tablet Take one-half tablet by mouth twice daily with meals. Take with food.   ? cholecalciferol (vitamin D3) (OPTIMAL D3) 50,000 units capsule TAKE ONE CAPSULE BY MOUTH WEEKLY ON FRIDAY   ? citalopram (CELEXA) 40 mg tablet Take one tablet by mouth daily.   ? diclofenac (VOLTAREN) 1 % topical gel Apply four g topically to affected area four times daily. (Patient taking differently: Apply four g topically to affected area four times daily as needed.)   ? docusate (COLACE) 100 mg capsule Take one capsule by mouth daily.   ? empagliflozin (JARDIANCE) 10 mg tablet Take one tablet by mouth daily.   ? famotidine (PEPCID) 20 mg tablet Take two tablets by mouth daily as needed. Indications: heartburn   ? fluticasone propionate (FLONASE) 50 mcg/actuation nasal spray Apply one spray to each nostril as directed twice daily. Shake bottle gently before using.   ? fluticasone propionate (FLOVENT HFA) 110 mcg/actuation inhaler Inhale two puffs by mouth into the lungs twice daily.   ? FREESTYLE LIBRE 2 SENSOR sensor Use one each as directed before meals and at bedtime. Use as directed.  Indications: type 2 diabetes mellitus   ? gabapentin (NEURONTIN) 800 mg tablet Take one tablet by mouth three times daily.   ? guaiFENesin LA (MUCINEX) 600 mg tablet Take one tablet by mouth twice daily.   ? hydrOXYzine HCL (ATARAX) 25 mg tablet Take one tablet by mouth four times a day while awake as needed for Anxiety. TAKE ONE TABLET BY MOUTH FOUR TIMES DAILY AS NEEDED FOR itching/swelling/anxiety   ? insulin lispro(+) (HUMALOG) 100 unit/mL injection Inject twenty four Units under the skin three times daily before meals. (Patient taking differently:  Inject twenty four Units under the skin daily as needed. Blood sugar above 240)   ? ipratropium bromide (ATROVENT) 0.02 % nebulizer solution Inhale 2.5 mL by mouth into the lungs four times daily as needed for Shortness of Breath or Wheezing.   ? lactobacillus rhamnosus (GG) (CULTURELLE) 10 billion cell cap Take one capsule by mouth daily with breakfast.   ? levothyroxine (SYNTHROID) 50 mcg tablet Take one tablet by mouth daily 30 minutes before breakfast.   ? lisinopril (ZESTRIL) 2.5 mg tablet Take one tablet by mouth daily.   ? LORazepam (ATIVAN) 1 mg tablet TAKE ONE-HALF TO ONE TABLET BY MOUTH TWICE DAILY AS NEEDED FOR SEVERE ANXIETY IS NOT RESPONSIVE TO HYDROXYZINE   ? meloxicam (MOBIC) 15 mg tablet Take one tablet by mouth daily.   ? metFORMIN-XR (GLUCOPHAGE XR) 500 mg extended release tablet Take two tablets by mouth twice daily with meals.   ? Needle (Disp) 27 G 27 gauge x 1/2 ndle Use as directed.   ? oxyCODONE-acetaminophen(+) (PERCOCET) 7.5-325 mg tablet Take one tablet to two tablets by mouth every 6 hours as needed.   ? syringe (disposable) 1 mL Use as directed.   ? tadalafiL (CIALIS) 5 mg tablet Take one tablet by mouth daily.   ? tamsulosin (FLOMAX) 0.4 mg capsule Take one capsule by mouth twice daily. Do not crush, chew or open capsules. Take 30 minutes following the same meal each day.   ? testosterone cypionate (DEPO-TESTOSTERONE) 200 mg/mL injection Inject 1 mL into the muscle every 14 days.   ? tiZANidine (ZANAFLEX) 4 mg tablet Take one tablet by mouth every 6 hours as needed.   ? vitamins, multiple tablet Take one tablet by mouth daily.   ? zolpidem (AMBIEN) 5 mg tablet TAKE ONE TABLET BY MOUTH AT BEDTIME AS NEEDED FOR SLEEP

## 2021-05-09 NOTE — Patient Instructions
DECREASE your LISINOPRIL to 2.5 mg ONE TABLET daily  DECREASE your CARVEDILOL 25 mg to HALF-TABLET TWICE  daily    FOLLOW UP APPOINTMENT:   Please see Roberts Gaudy, PA  in 3 months. Please call [phone] to cancel or reschedule your visit.     In order to provide you the best care possible we ask that you follow up as below:  For questions/concerns: please call Mervyn Skeeters, PA nursing line at [phone] Monday - Friday 8-5 only.   Please leave a detailed message with your name, date of birth, and reason for your call.  A nurse will return your call as soon as possible.  For questions/concerns AFTER HOURS: Please call [phone]  For all medication refills: please contact your pharmacy.    Scheduling: Please call [phone]     It was truly our pleasure seeing you today. Thank you for choosing [facility] for your heart health!     Instructions given by Blima Ledger., RN

## 2021-05-09 NOTE — Progress Notes
URGENT REQUEST FROM [facility]     STAT REQUEST FOR MEDICAL RECORDS FOR:     Thomas Francis DOB 03/05/1970      PLEASE FAX:                           MOST RECENT FASTING LIPID PROFILE                PLEASE FAX TO [phone]  ATTENTION:  [facility] Nurse Team     [facility] YOU!

## 2021-05-17 ENCOUNTER — Encounter: Admit: 2021-05-17 | Discharge: 2021-05-17 | Payer: MEDICARE

## 2021-05-17 MED ORDER — LORAZEPAM 1 MG PO TAB
ORAL_TABLET | 0 refills
Start: 2021-05-17 — End: ?

## 2021-05-17 MED ORDER — ZOLPIDEM 5 MG PO TAB
5 mg | ORAL_TABLET | Freq: Every evening | ORAL | 0 refills | PRN
Start: 2021-05-17 — End: ?

## 2021-06-03 ENCOUNTER — Encounter: Admit: 2021-06-03 | Discharge: 2021-06-03 | Payer: MEDICARE

## 2021-06-06 ENCOUNTER — Encounter: Admit: 2021-06-06 | Discharge: 2021-06-06 | Payer: MEDICARE

## 2021-06-06 MED ORDER — HYDROXYZINE HCL 25 MG PO TAB
ORAL_TABLET | ORAL | 2 refills | 30.00000 days | Status: AC
Start: 2021-06-06 — End: ?

## 2021-06-11 ENCOUNTER — Encounter: Admit: 2021-06-11 | Discharge: 2021-06-11 | Payer: MEDICARE

## 2021-06-17 ENCOUNTER — Encounter: Admit: 2021-06-17 | Discharge: 2021-06-17 | Payer: MEDICARE

## 2021-06-17 MED ORDER — ZOLPIDEM 5 MG PO TAB
5 mg | ORAL_TABLET | Freq: Every evening | ORAL | 0 refills | PRN
Start: 2021-06-17 — End: ?

## 2021-06-17 MED ORDER — LORAZEPAM 1 MG PO TAB
ORAL_TABLET | 0 refills
Start: 2021-06-17 — End: ?

## 2021-06-20 ENCOUNTER — Encounter: Admit: 2021-06-20 | Discharge: 2021-06-20 | Payer: MEDICARE

## 2021-06-20 IMAGING — MR MR SHOULDER^[PERSON_NAME] SHOULDER WO
5 series · 40 of 40 positions shown · non-contrast
Comparison: none

[Series 6: t2_tse_fs_axial · axial · 4.0mm · 0.70mm/px · z∈[-28,+64]mm · 7 of 11 slices shown]
[im 1/11]
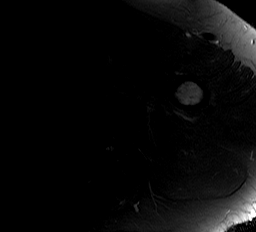
[im 2/11]
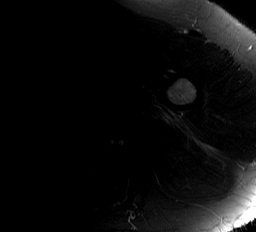
[im 4/11]
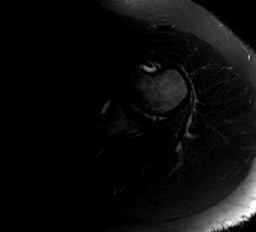
[im 6/11]
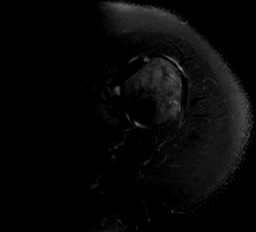
[im 7/11]
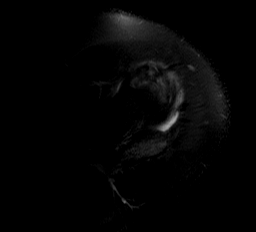
[im 9/11]
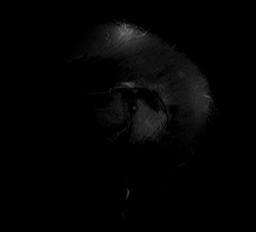
[im 11/11]
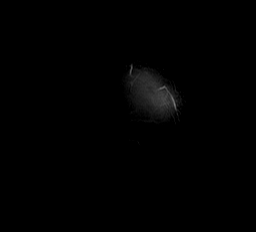

[Series 7: t2_tse_fs_cor · oblique · 4.0mm · 0.55mm/px · 8 of 12 slices shown]
[im 1/12]
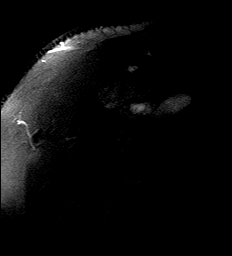
[im 2/12]
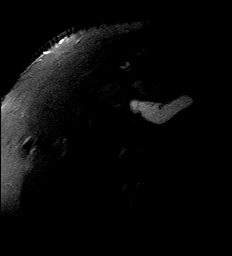
[im 4/12]
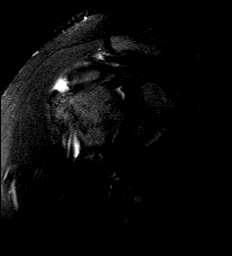
[im 5/12]
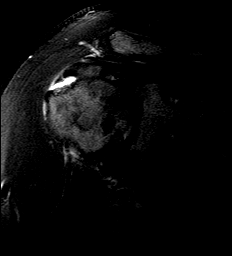
[im 7/12]
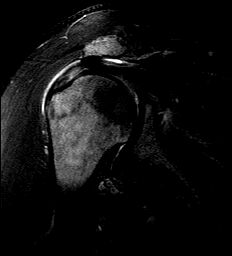
[im 8/12]
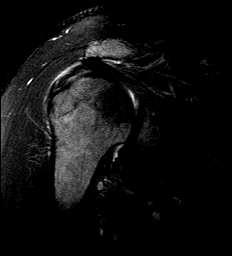
[im 10/12]
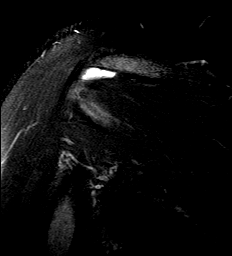
[im 12/12]
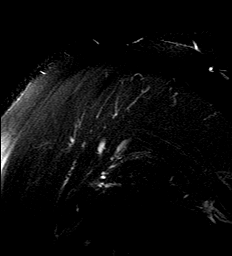

[Series 8: t1_tse_sag · oblique · 4.0mm · 0.27mm/px · 10 of 16 slices shown]
[im 1/16]
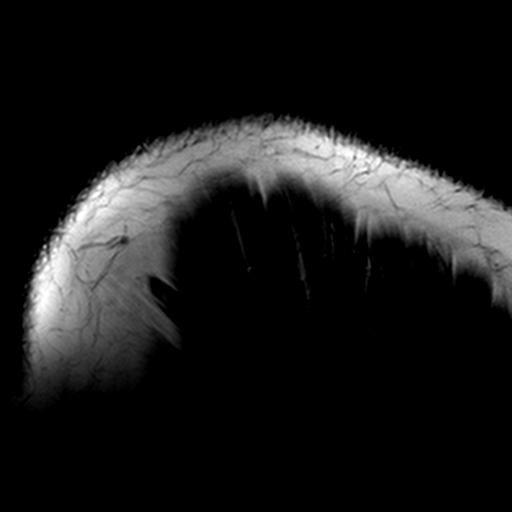
[im 2/16]
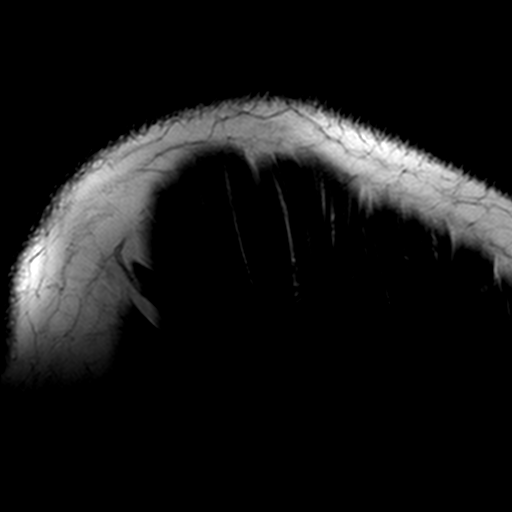
[im 4/16]
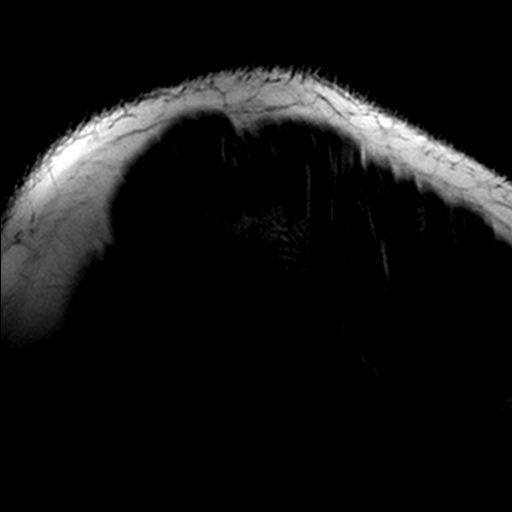
[im 6/16]
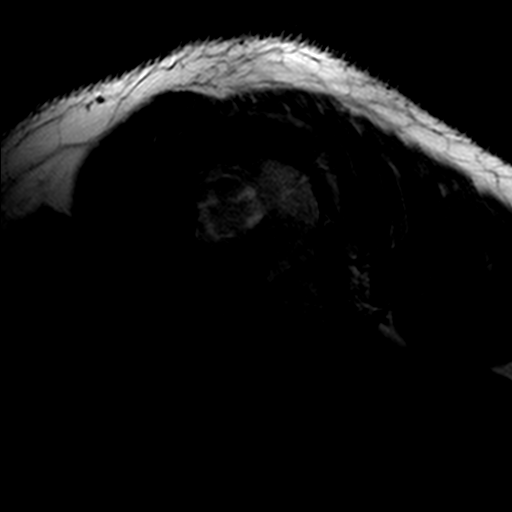
[im 7/16]
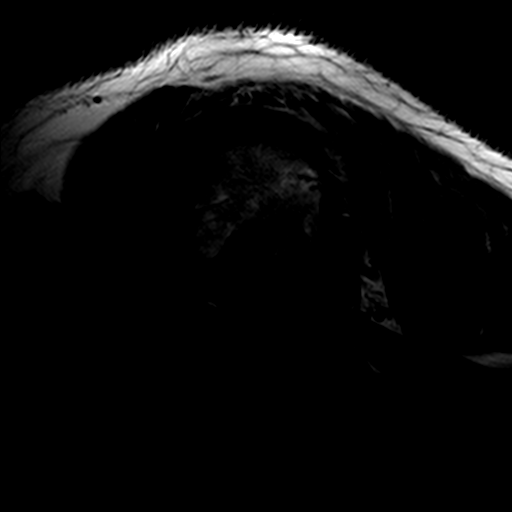
[im 9/16]
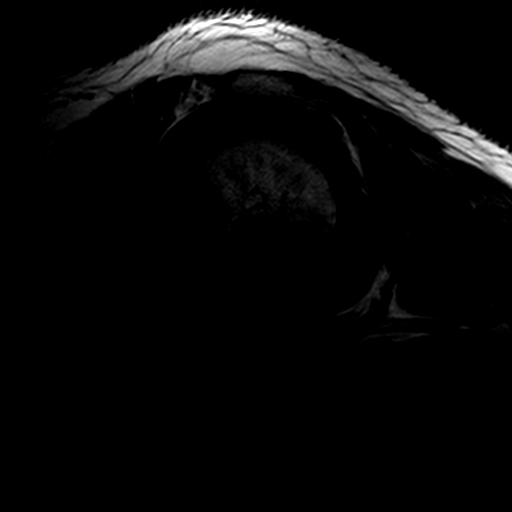
[im 11/16]
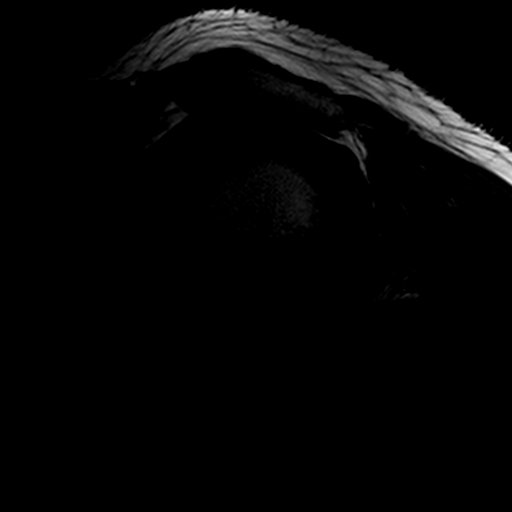
[im 12/16]
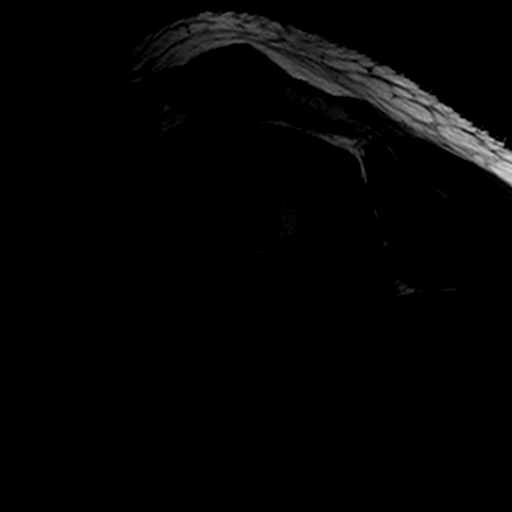
[im 14/16]
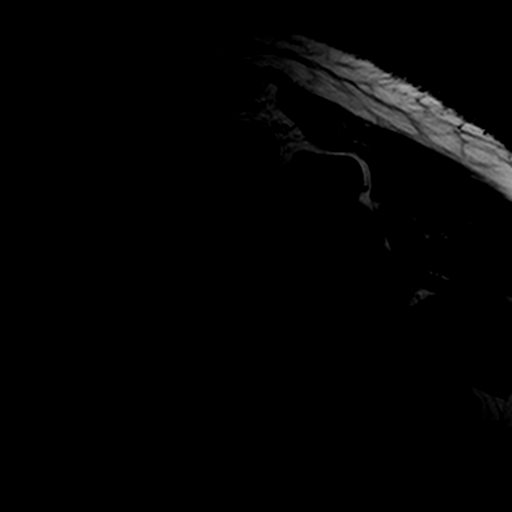
[im 16/16]
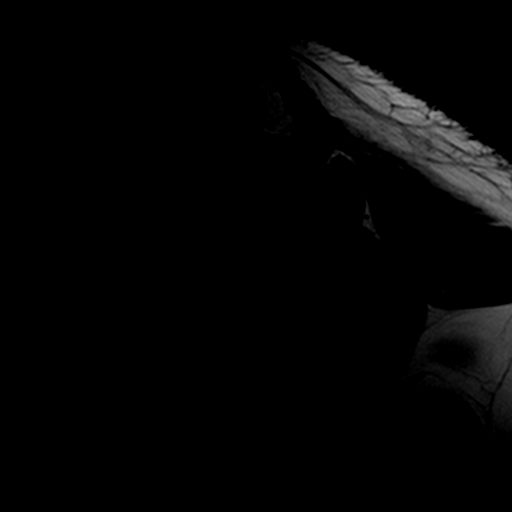

[Series 9: STIR · sagittal · 4.2mm · 0.55mm/px · 8 of 12 slices shown (1 of 2)]
[im 1/12]
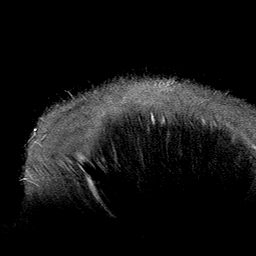
[im 2/12]
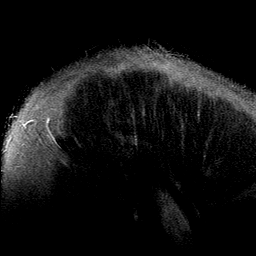
[im 4/12]
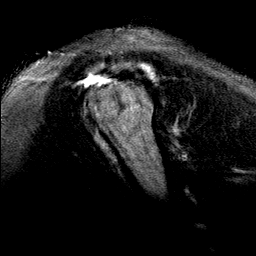
[im 5/12]
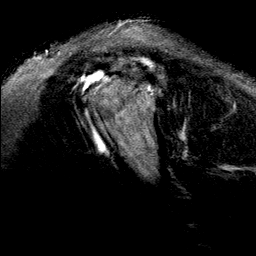
[im 7/12]
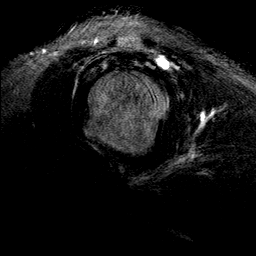
[im 8/12]
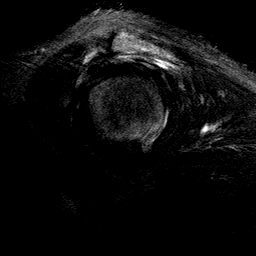
[im 10/12]
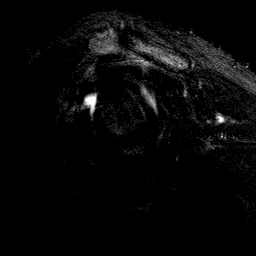
[im 12/12]
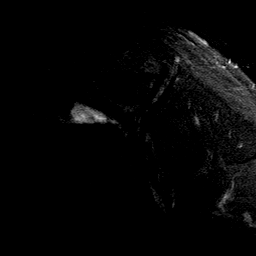

[Series 13: STIR · oblique · 4.2mm · 0.55mm/px · 7 of 10 slices shown (2 of 2)]
[im 1/10]
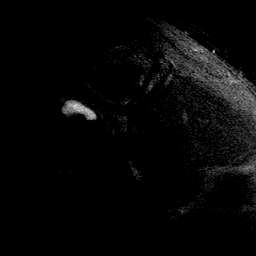
[im 2/10]
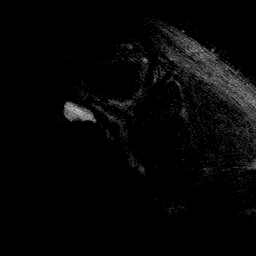
[im 4/10]
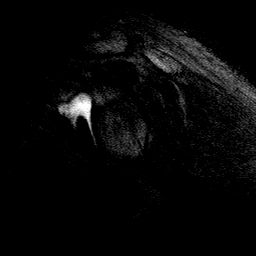
[im 5/10]
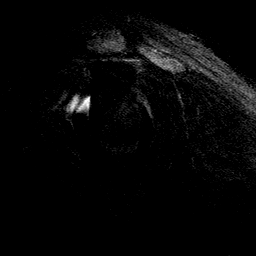
[im 7/10]
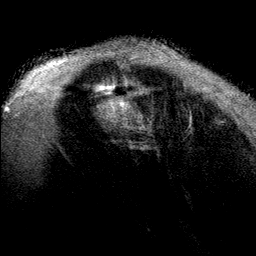
[im 8/10]
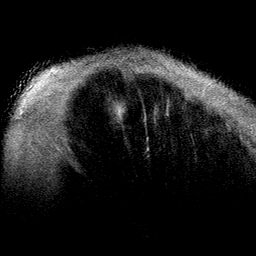
[im 10/10]
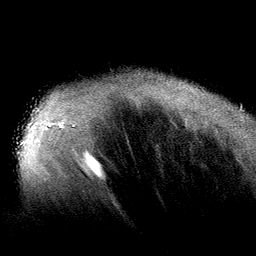

[40 of 40 positions shown; findings below may reference images not displayed]

DIAGNOSTIC STUDIES

EXAM

Left shoulder MRI.

INDICATION

left shoulder pain
LEFT SHOULDER PAIN WITH VERY LIMITED ROM AND FAILED PT.  RG

TECHNIQUE

Routine noncontrast left shoulder MRI protocol with multiplanar T2 fat sat, T1 spin echo, and stir
sequencing.

COMPARISONS

No prior studies are available for comparison.

FINDINGS

Acromioclavicular joint: There is mild capsular hypertrophy. There is a small amount of fluid in the
subacromial/subdeltoid bursa which is most likely reflective of decompressed joint fluid.
Undersurface spurring at the tip of the acromion is present. The coracoacromial ligament is not
thickened. The coracoclavicular ligaments are intact.

Rotator cuff: There is a full thickness insertional tear of the supraspinatus tendon which is
retracted approximately 6 millimeters. The tear measures up to approximately 16 millimeters AP
dimension. Background hypertrophic tendinopathy is present. There is also tendinopathy of the
infraspinatus tendon. The teres minor and subscapularis tendons are intact.

Labrum: There is no evidence of labral tear.

Biceps tendon: Well-seated in the intertubercular groove. No intracapsular tear is evident. Minimal
biceps tendinopathy is suggested. The bicipital anchor is intact.

Marrow/joint space: The glenohumeral ligaments are not thickened or edematous. No fracture. No bone
edema. No aggressive osseous lesion. Both

IMPRESSION

Supraspinatus tendinopathy with full thickness insertional tear.

Tech Notes:

LEFT SHOULDER PAIN WITH VERY LIMITED ROM AND FAILED PT.  RG

## 2021-06-20 NOTE — Telephone Encounter
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 Left my direct call back number .

## 2021-06-20 NOTE — Telephone Encounter
Received voicemail from patient regarding upcoming appointment with Dr. Irena ReichmannFantus. Patient expressed interest in aquablation and shared that he has had ED since having prostate procedure in 2019. I explained that Dr. Irena ReichmannFantus can review his history and discuss future options and next steps at his appointment later this month. Patient appreciated call back and verbalized understanding.

## 2021-07-02 ENCOUNTER — Encounter: Admit: 2021-07-02 | Discharge: 2021-07-02 | Payer: MEDICARE

## 2021-07-02 IMAGING — CR SHOULDCMLT
3 series · 3 of 3 positions shown · non-contrast
Comparison: none

[shoulder external]
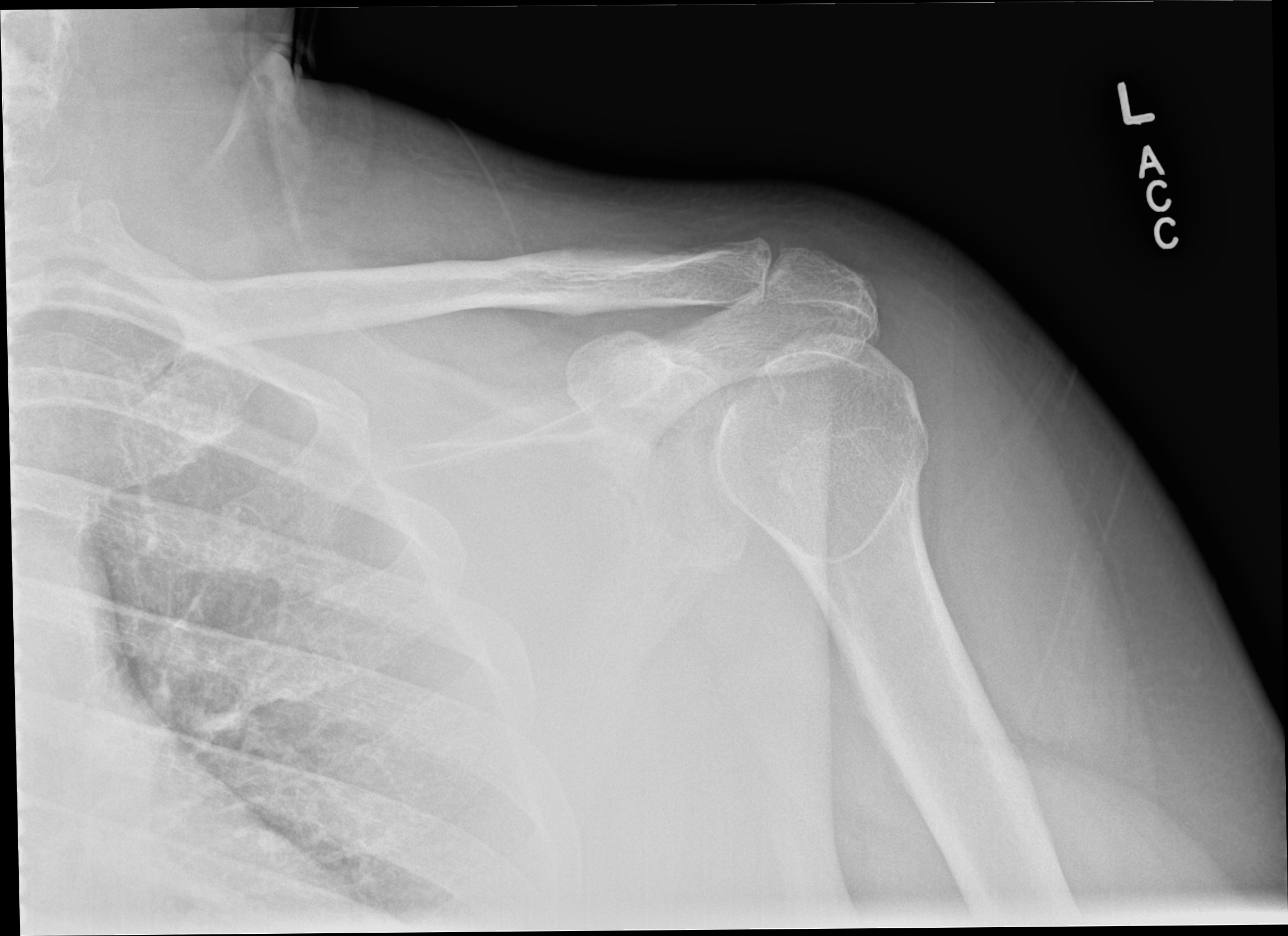

[shoulder y-view]
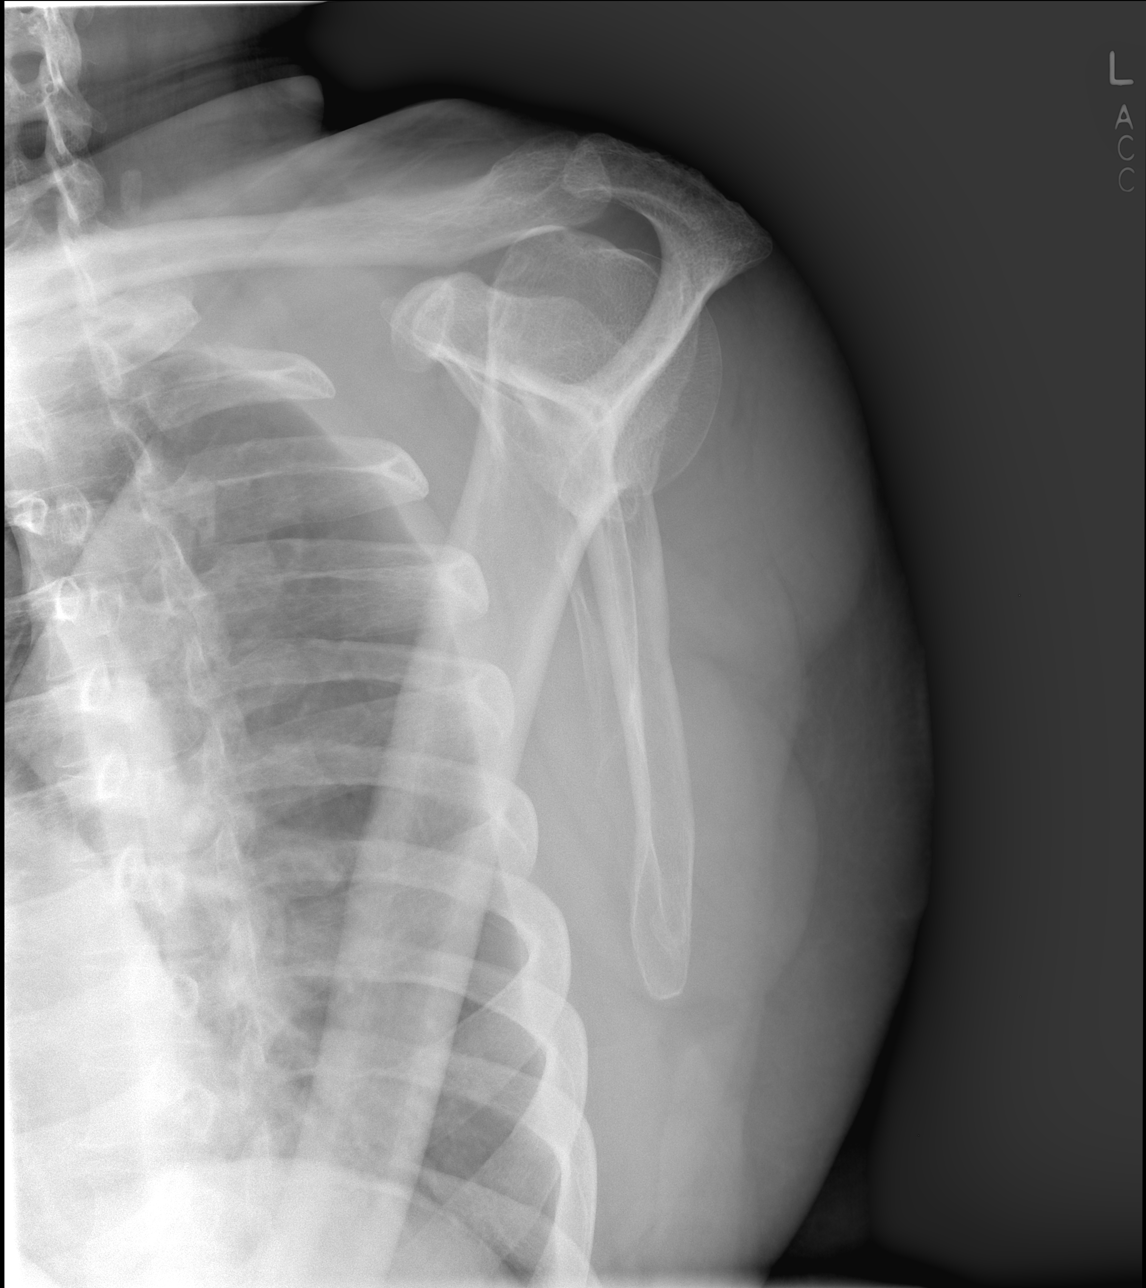

[shoulder axillary]
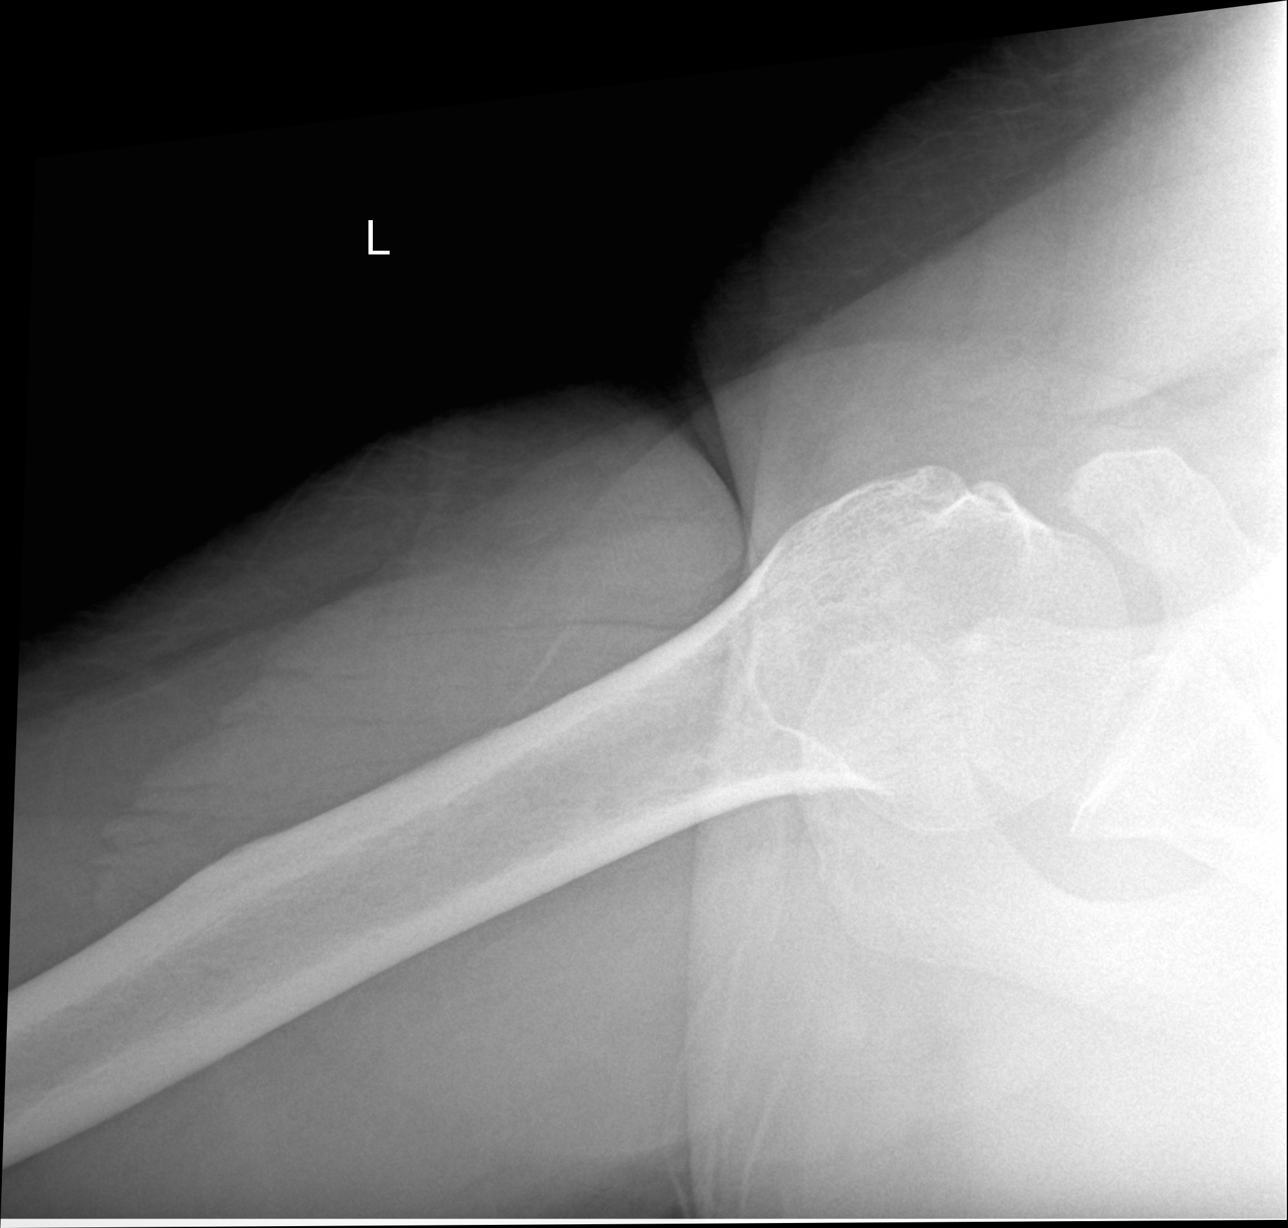

[3 of 3 positions shown; findings below may reference images not displayed]

EXAM

XR shoulder left, complete

INDICATION

Left shoulder pain
Left shoulder pain. Patient states he fell about a month ago.

TECHNIQUE

Three views of the left shoulder

COMPARISONS

None available at the time of dictation.

FINDINGS

Cortical irregularity of the superior medial humeral head, which may represent nondisplaced such is
a Hill-Sachs defect and correlate with clinical exam. Normal alignment of the left AC joint.

IMPRESSION
1. Suspicion for nondisplaced impaction fracture involving the greater tuberosity.

Tech Notes:

Left shoulder pain. Patient states he fell about a month ago.

## 2021-07-04 ENCOUNTER — Encounter: Admit: 2021-07-04 | Discharge: 2021-07-04 | Payer: MEDICARE

## 2021-07-04 ENCOUNTER — Ambulatory Visit: Admit: 2021-07-04 | Discharge: 2021-07-05 | Payer: MEDICARE

## 2021-07-04 DIAGNOSIS — F439 Reaction to severe stress, unspecified: Secondary | ICD-10-CM

## 2021-07-04 DIAGNOSIS — F332 Major depressive disorder, recurrent severe without psychotic features: Secondary | ICD-10-CM

## 2021-07-04 MED ORDER — LORAZEPAM 1 MG PO TAB
.5-1 mg | ORAL_TABLET | Freq: Two times a day (BID) | ORAL | 1 refills | 12.00000 days | Status: AC | PRN
Start: 2021-07-04 — End: ?

## 2021-07-04 MED ORDER — BUPROPION XL 300 MG PO TB24
300 mg | ORAL_TABLET | Freq: Every morning | ORAL | 2 refills | Status: AC
Start: 2021-07-04 — End: ?

## 2021-07-04 MED ORDER — HYDROXYZINE HCL 25 MG PO TAB
25 mg | ORAL_TABLET | Freq: Four times a day (QID) | ORAL | 2 refills | 30.00000 days | Status: AC | PRN
Start: 2021-07-04 — End: ?

## 2021-07-04 MED ORDER — CITALOPRAM 20 MG PO TAB
ORAL_TABLET | 0 refills | Status: AC
Start: 2021-07-04 — End: ?

## 2021-07-04 MED ORDER — DULOXETINE 30 MG PO CPDR
ORAL_CAPSULE | ORAL | 0 refills | 60.00000 days | Status: AC
Start: 2021-07-04 — End: ?
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
## 2021-07-07 ENCOUNTER — Encounter: Admit: 2021-07-07 | Discharge: 2021-07-07 | Payer: MEDICARE

## 2021-07-07 MED ORDER — CITALOPRAM 40 MG PO TAB
ORAL_TABLET | 3 refills
Start: 2021-07-07 — End: ?

## 2021-07-11 ENCOUNTER — Encounter: Admit: 2021-07-11 | Discharge: 2021-07-11 | Payer: MEDICARE

## 2021-07-11 DIAGNOSIS — J449 Chronic obstructive pulmonary disease, unspecified: Secondary | ICD-10-CM

## 2021-07-11 DIAGNOSIS — R7989 Other specified abnormal findings of blood chemistry: Secondary | ICD-10-CM

## 2021-07-11 DIAGNOSIS — G4730 Sleep apnea, unspecified: Secondary | ICD-10-CM

## 2021-07-11 DIAGNOSIS — G4733 Obstructive sleep apnea (adult) (pediatric): Secondary | ICD-10-CM

## 2021-07-11 DIAGNOSIS — I10 Essential (primary) hypertension: Secondary | ICD-10-CM

## 2021-07-11 DIAGNOSIS — N401 Enlarged prostate with lower urinary tract symptoms: Secondary | ICD-10-CM

## 2021-07-11 DIAGNOSIS — E669 Obesity, unspecified: Secondary | ICD-10-CM

## 2021-07-11 DIAGNOSIS — N529 Male erectile dysfunction, unspecified: Secondary | ICD-10-CM

## 2021-07-11 DIAGNOSIS — G629 Polyneuropathy, unspecified: Secondary | ICD-10-CM

## 2021-07-11 DIAGNOSIS — J45909 Unspecified asthma, uncomplicated: Secondary | ICD-10-CM

## 2021-07-11 DIAGNOSIS — E039 Hypothyroidism, unspecified: Secondary | ICD-10-CM

## 2021-07-11 DIAGNOSIS — E119 Type 2 diabetes mellitus without complications: Secondary | ICD-10-CM

## 2021-07-11 MED ORDER — ALFUZOSIN 10 MG PO TB24
10 mg | ORAL_TABLET | Freq: Every day | ORAL | 3 refills | Status: AC
Start: 2021-07-11 — End: ?

## 2021-07-11 NOTE — Progress Notes
PVR = 15ml

## 2021-07-11 NOTE — Progress Notes
CC: Thomas Francis [id number]), is a 51 y.o. male referred for consultation by Hoyt Koch, MD because of: LUTS/ED on 07/11/2021.    PCP:  Hoyt Koch    Subjective    HPI:  The patient is a 51 y.o. 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  Was on flomax but self stopped 2/2 deceased ejaculations.  Noted his urination has gotten worse since this time.  No hematuria and dysuria.    Has tried c5, v100 on top, ICI alprostadil, VED, nothing has given him adequate penetration quality erections that last.  Notes w/ ICI had distal tapering but even base was not adequate for erections.             Medical / Surgical / Social / Family History Reviewed:   has a past medical history of Asthma, COPD (chronic obstructive pulmonary disease) (HCC), DM (diabetes mellitus) (HCC), Hypertension, Hypothyroid, Low testosterone, Morbid obesity with BMI of 50.0-59.9, adult (HCC) (06/17/2017), Neuropathy (2010), Obesity, Obstructive sleep apnea, and Sleep apnea.   has a past surgical history that includes tonsillectomy; Abscess drainage (N/A, 06/01/2017); Abscess drainage (Right, 12/2016); Upper gastrointestinal endoscopy (N/A, 01/20/2019); and Upper gastrointestinal endoscopy (N/A, 01/20/2019).   reports that he has never smoked. He quit smokeless tobacco use about 33 years ago.  His smokeless tobacco use included chew. He reports current alcohol use. He reports that he does not currently use drugs.   family history includes Heart Disease in his father; None Reported in his mother.    Allergies / Medications Reviewed:  Allergies   Allergen Reactions   ? Vancomycin SEE COMMENTS     PT HAD AKI KIDNEY FAILURE FROM VANCOMYCIN   ? Ketamine SEE COMMENTS     STOPPED BREATHING WHEN GIVEN IV TOO FAST  STOPPED BREATHING WHEN GIVEN IV TOO FAST         Current Outpatient Medications:   ?  albuterol sulfate (PROAIR HFA) 90 mcg/actuation HFA aerosol inhaler, INHALE 2 PUFFS BY MOUTH FOUR TIMES DAILY AS NEEDED FOR SHORTNESS OF BREATH, Disp: , Rfl:   ?  alfuzosin (UROXATRAL) 10 mg tablet, Take one tablet by mouth daily., Disp: 90 tablet, Rfl: 3  ?  alprostadil 20 mcg/mL soln IC inj(cmpd), Inject five mcg into base of penis as directed as Needed. Max 1 dose/ 24-48 hrs., Disp: 10 mL, Rfl: 11  ?  aspirin EC 81 mg tablet, Take one tablet by mouth daily. Take with food., Disp: , Rfl:   ?  buPROPion XL (WELLBUTRIN XL) 300 mg tablet, Take one tablet by mouth every morning. Do not crush or chew., Disp: 30 tablet, Rfl: 2  ?  carvediloL (COREG) 25 mg tablet, Take one-half tablet by mouth twice daily with meals. Take with food., Disp: 45 tablet, Rfl: 3  ?  cholecalciferol (vitamin D3) (OPTIMAL D3) 50,000 units capsule, TAKE ONE CAPSULE BY MOUTH WEEKLY ON FRIDAY, Disp: , Rfl:   ?  citalopram (CELEXA) 20 mg tablet, Take one tablet by mouth daily for 7 days then stop., Disp: 7 tablet, Rfl: 0  ?  diclofenac (VOLTAREN) 1 % topical gel, Apply four g topically to affected area four times daily. (Patient taking differently: Apply four g topically to affected area four times daily as needed.), Disp: 300 g, Rfl: 3  ?  docusate (COLACE) 100  mg capsule, Take one capsule by mouth daily., Disp: , Rfl:   ?  duloxetine DR (CYMBALTA) 30 mg capsule, Take one capsule by mouth daily for 7 days then increase to 2 capsule by mouth daily thereafter., Disp: 53 capsule, Rfl: 0  ?  fluticasone propionate (FLONASE) 50 mcg/actuation nasal spray, Apply one spray to each nostril as directed twice daily. Shake bottle gently before using., Disp: , Rfl:   ?  fluticasone propionate (FLOVENT HFA) 110 mcg/actuation inhaler, Inhale two puffs by mouth into the lungs twice daily., Disp: , Rfl:   ?  FREESTYLE LIBRE 2 SENSOR sensor, Use one each as directed before meals and at bedtime. Use as directed.  Indications: type 2 diabetes mellitus, Disp: 6 each, Rfl: 3  ?  furosemide (LASIX) 40 mg tablet, Take one tablet by mouth every morning., Disp: , Rfl:   ?  gabapentin (NEURONTIN) 800 mg tablet, Take one tablet by mouth three times daily., Disp: , Rfl:   ?  guaiFENesin LA (MUCINEX) 600 mg tablet, Take one tablet by mouth twice daily., Disp: 180 tablet, Rfl: 0  ?  hydrOXYzine HCL (ATARAX) 25 mg tablet, Take one tablet by mouth four times daily as needed for Anxiety., Disp: 120 tablet, Rfl: 2  ?  ipratropium bromide (ATROVENT) 0.02 % nebulizer solution, Inhale 2.5 mL by mouth into the lungs four times daily as needed for Shortness of Breath or Wheezing., Disp: , Rfl:   ?  JARDIANCE 25 mg tablet, Take one tablet by mouth daily., Disp: , Rfl:   ?  lactobacillus rhamnosus (GG) (CULTURELLE) 10 billion cell cap, Take one capsule by mouth daily with breakfast., Disp: 90 capsule, Rfl: 0  ?  LEVEMIR FLEXPEN 100 unit/mL (3 mL) injection pen, Inject sixty Units under the skin at bedtime daily., Disp: , Rfl:   ?  levocetirizine (XYZAL) 5 mg tablet, Take one tablet by mouth daily., Disp: , Rfl:   ?  levothyroxine (SYNTHROID) 50 mcg tablet, Take one tablet by mouth daily 30 minutes before breakfast., Disp: , Rfl:   ?  lisinopril (ZESTRIL) 2.5 mg tablet, Take one tablet by mouth daily., Disp: 90 tablet, Rfl: 3  ?  LORazepam (ATIVAN) 1 mg tablet, Take one-half tablet to one tablet by mouth twice daily as needed for Other... (severe anxiety or panic attacks)., Disp: 60 tablet, Rfl: 1  ?  meloxicam (MOBIC) 15 mg tablet, Take one tablet by mouth daily., Disp: , Rfl:   ?  metFORMIN-XR (GLUCOPHAGE XR) 500 mg extended release tablet, Take two tablets by mouth twice daily with meals., Disp: , Rfl:   ?  Needle (Disp) 27 G 27 gauge x 1/2 ndle, Use as directed., Disp: 20 each, Rfl: 11  ?  ondansetron (ZOFRAN ODT) 4 mg rapid dissolve tablet, as Needed., Disp: , Rfl:   ?  OZEMPIC 1 mg/dose (4 mg/3 mL) injection PEN, Inject one mg under the skin every 7 days., Disp: , Rfl: ?  phentermine (ADIPEX-P) 37.5 mg capsule, Take one capsule by mouth daily., Disp: , Rfl:   ?  syringe (disposable) 1 mL, Use as directed., Disp: 12 each, Rfl: 11  ?  tadalafiL (CIALIS) 5 mg tablet, Take one tablet by mouth daily., Disp: 30 tablet, Rfl: 11  ?  testosterone cypionate (DEPO-TESTOSTERONE) 200 mg/mL injection, Inject 1 mL into the muscle every 14 days., Disp: , Rfl:   ?  tiZANidine (ZANAFLEX) 4 mg tablet, Take one tablet by mouth every 6 hours as needed., Disp: 15 tablet,  Rfl: 3  ?  vitamins, multiple tablet, Take one tablet by mouth daily., Disp: , Rfl:   ?  zolpidem (AMBIEN) 5 mg tablet, TAKE ONE TABLET BY MOUTH AT BEDTIME AS NEEDED FOR SLEEP, Disp: 30 tablet, Rfl: 0     Review of systems (X and or explanation = positive, blank box = negative):  General Urologic   Fevers  Hematuria    Unintentional weight loss  Dysuria    Fatigue x Urinary frequency x   Blindness  Urinary urgency    Blurred vision  Urinary incontinence    History of a heart attack  Incomplete bladder emptying x   Heart murmur  Nocturia x   Bone pain  Weak stream x   Fractures  Straining x   Focal weakness  Abdominal pain    Stroke  Bladder pain    Melanoma  Kidney pain x   Basal cell carcinoma  Scrotal Pain X (mild compared to other pain he has - lots of neuropathy)   Diabetes x History of urologic cancer    Thyroid Disease x History of urologic surgery    Clotting Problem  History of hernia / hernia repair    Bleeding problem  History of urologic trauma    Pulmonary embolus  History of kidney stones    Congestive heart failure  History of urinary tract infections    Rectal bleeding  History of sexually transmitted diseases    Reflux  Family history of bleeding/clotting problems      Lifestyle Data:  Domain  Score Notes   STOP BANG  OSA if > 2 consider discussing w/PCP for sleep evaluation   PHQ-2  2 if > 2 consider discussing w/ PCP for formal depression screen   Diet Score  Q1: 0-1=3, 2-3=2, 4-5=1, 5-6=0 Q2: 0-1=0, 2-3=1, 4-5=2, 5-6=3 2 if > 2 consider discussing offer diet recommendations   Activity Level  Average minutes of activity per representative week Light 180  Moderate 60  Vigorous 90  If < 150 moderate, <75 vigorous, or patient interested offer exercise recommendations     He is able to walk 4 blocks or 2 flights of stairs without chest pain or significant shortness of breath.       Objective     Physical exam:  Vitals: BP 120/80 (BP Source: Arm, Left Upper, Patient Position: Sitting)  - Pulse 119  - Temp 37.2 ?C (98.9 ?F) (Temporal)  - Resp 16  - Ht 170.2 cm (5' 7)  - Wt 126.6 kg (279 lb)  - SpO2 98%  - BMI 43.70 kg/m?   General appearance: Well-appearing, healthy male in no apparent distress  Skin: Normal appearance, no excessive sebum production noted.  Mental Status: Alert and oriented, normal affect.  Neuro:  Grossly intact  HEENT: Normocephalic, atraumatic.  Neck: Normal.    Chest: Non tender, no gynecomastia  Abdomen: Soft, non-tender, no masses or organomegaly. Bladder is not palpable. No costovertebral tenderness. No evidence of inguinal hernia.  Extremities: No peripheral edema    Penis: Penis is circumcised with + 2x1cm distal dorsal plaque, secondarily buried even with stretch + pre-pubic fat pad. Urethral meatus is normal.  Scrotum: Scrotum is grossly normal.  Testes: Descended bilaterally no evidence of masses or tenderness.      Data review:  Labs:  Testosterone:  No results found for: TESTOSTER, TESTERTOTLCM     Urine Dip:  Lab Results   Component Value Date    URBLOODPOC Negative 07/11/2021  URGLUCPOC 2+ (A) 07/11/2021    URLEUKPOC Negative 07/11/2021    URNITRIPOC Negative 07/11/2021    URPROTPOC Negative 07/11/2021    UROBILPOC 0.2 07/11/2021    UBLD NEG 07/17/2020     Urine Micro:  Lab Results   Component Value Date    UWBC 2-10 07/17/2020    URBC 0-2 07/17/2020    URBC 2-10 06/20/2017    URBC PACKED 06/17/2017    UMUC TRACE 07/17/2020    UBACT FEW (A) 06/17/2017    USQUCELLS 0-2 07/17/2020       No results found for: Lebron Quam, URINECULT   PSA:  Lab Results   Component Value Date    PSA 0.15 06/25/2017     Creatinine:  Lab Results   Component Value Date    CR 1.07 05/09/2021    CR 1.09 04/24/2021    CR 0.91 01/09/2019       Hematocrit:  Lab Results   Component Value Date    HCT 53.1 05/09/2021    HCT 55.5 (H) 04/24/2021    HCT 36.4 (L) 07/12/2018     STI panel:  Lab Results   Component Value Date    NGONORPCR NEG 05/28/2017    CHLTRACPCR NEG 05/28/2017       Hemoglobin A1c:  Lab Results   Component Value Date    HGBA1C 5.4 04/24/2021    HGBA1C 8.8 (H) 06/25/2017    HGBA1C 11.3 (H) 05/30/2017       UDS 2023:  UROFLOW:  Qmax (mL/ s): 14.3  Qavg (mL/ s):  6.8  Voided volume (mL): 139  PVR (mL):  15  Curve: intermittent, prolonged    FILL:  First sensation (mL): 495  First desire (mL): 710  Strong desire (mL): 847  Capacity: 879   DO: mild    VOID:  Qmax (ml/s): 29  Qavg (mL/ s):  10  Pdet @ Qmax (cm H20): 110  Pdetmax (cm H20): 185  Voided volume (mL): 819  Pvr (mL): 61    EMG: synnergic      ASSESSMENT:  1.  High pressure, low flow voiding pattern with preserved detrusor funciton; large capacity hyposensate bladder    Imaging Studies:  No results found for this or any previous visit from the past 365 days.      Pathology:  Noncontributory        Medical Decision Making:    DISCUSSION:  We discussed alpha blockers, which are segregated between nonselective and selective blockers. I typically prefer the selective alpha blockers (e.g. tamsulosin, alfuzosin and silodosin) for their decrease in systemic side effects. These work on the bladder and prostate to open the urination channel to facilitate more robust voiding and emptying. Side effects include dizziness, decreased blood pressure and a decrease or lack of antegrade ejaculation. We also specifically discussed IFIS.         ASSESSMENT:   51 y.o. 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  Was on flomax but self stopped 2/2 deceased ejaculations.  Noted his urination has gotten worse since this time.  No hematuria and dysuria.    Has tried c5, v100 on top, ICI alprostadil, VED, nothing has given him adequate penetration quality erections that last.  Notes w/ ICI had distal tapering but even base was not adequate for erections    1. 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 Peyronie's disease with distal tapering only, no curve, unclear when/if active  3. LUTS combined storage and voiding sx w/ UDS showing obstruction unclear gland size, had hx of TURP 2019 for abscess of 17g, some success w/ flomax but self dc'd bc of lack of ejaculation  4. Aspermia 2/2 meds and/or DM      PLAN:  UA/PVR  Alfuzosin (stop flomax) wont take w/ c5  Cysto/ sizing TRUS    Will continue C5  Consider repeat ICI trial after weight loss, is interested in IPP but given anatomy is not currently a good candidate        RETURN TO CLINIC in 1.5 months or sooner if warranted, symptoms change, or concern arises regarding the aforementioned or new symptoms.    Billing:  Same Day pre-visit chart review: 10  Encounter with patient including counseling: 28  Post-visit reconciliation: 5  I spent total duration of 43 minutes on this patient care.    Specifically, the direct counseling included a discussion of each of the diagnoses above, the items of the detailed assessment and plan above, and discussion of the prognoses, treatment plans available, their risks, benefits, and alternatives, including an explanation of therapeutic medications proposed, their side effects, vs. discussion of surgical alternatives to medical treatment, and their risks, benefits and alternatives and expected outcomes.

## 2021-07-11 NOTE — Telephone Encounter
07/11/2021 6:17 PM     Received fax from [facility] that patient is scheduled for left rotator cuff repair on 07/22/21. They are requesting cardiac clearance.     Patient was last seen by JAF on 10/23/20 and JO on 05/09/21. Last Echocardiogram was 11/22/20 and LHC on 07/14/18, no stress testing since LHC.     Will route to JAF for review.     Phone: [phone]  Fax: [phone] ATTN Tobin Chad

## 2021-07-22 ENCOUNTER — Encounter: Admit: 2021-07-22 | Discharge: 2021-07-22 | Payer: MEDICARE

## 2021-07-22 IMAGING — MR SHOULDRTWO
4 of 6 series · 16 of 40 positions shown · non-contrast
Comparison: none

[Series 7: STIR · oblique · right · 4.0mm · 0.62mm/px · 4 of 23 slices shown]
[im 1/23]
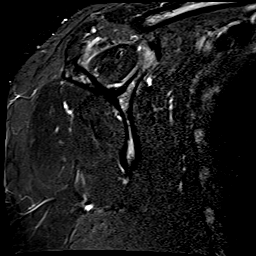
[im 8/23]
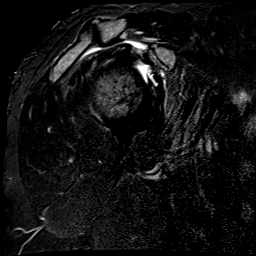
[im 15/23]
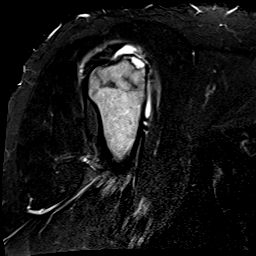
[im 23/23]
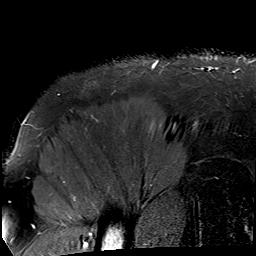

[Series 8: T1 · oblique · right · 4.0mm · 0.48mm/px · 4 of 23 slices shown]
[im 1/23]
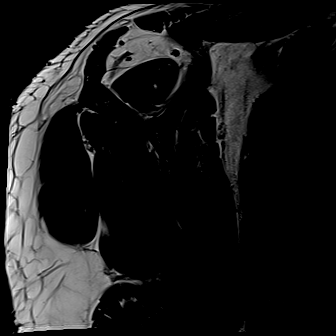
[im 8/23]
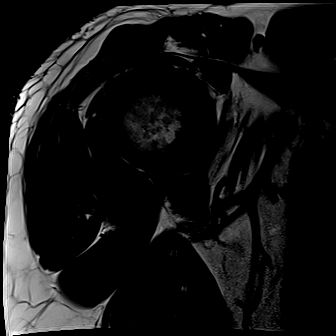
[im 15/23]
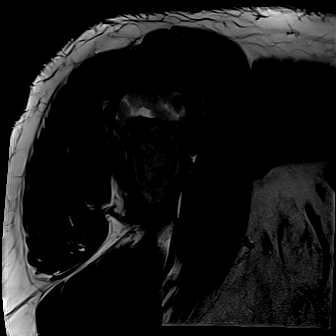
[im 23/23]
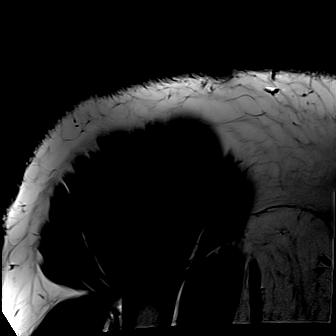

[Series 9: T2 fat-sat · oblique · right · 4.0mm · 0.50mm/px · 4 of 20 slices shown (1 of 2)]
[im 1/20]
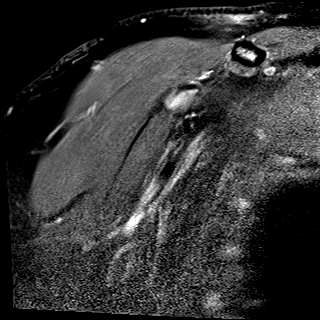
[im 7/20]
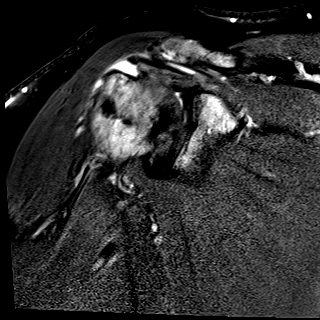
[im 13/20]
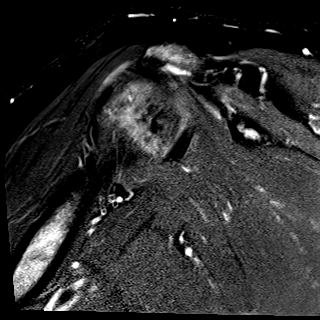
[im 20/20]
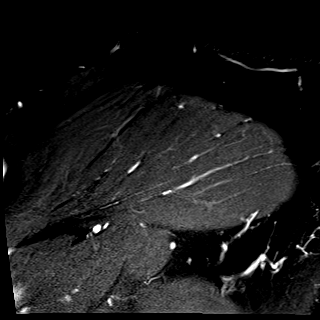

[Series 10: T2 fat-sat · axial · right · 4.0mm · 0.58mm/px · z∈[-9,+79]mm · 4 of 20 slices shown (2 of 2)]
[im 1/20]
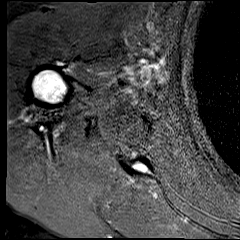
[im 7/20]
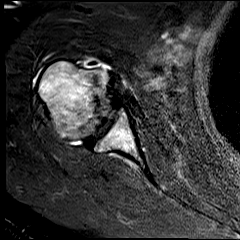
[im 13/20]
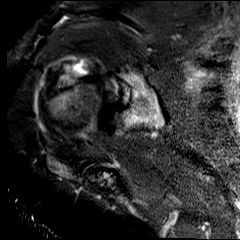
[im 20/20]
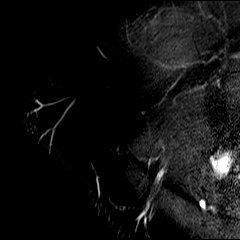

[16 of 40 positions shown; findings below may reference images not displayed]

DIAGNOSTIC STUDIES

EXAM

MRI of the right shoulder without contrast

INDICATION

Right shoulder pain
RT SHOULDER PAIN AFTER A FALL.  LIMITED ROM.  RG

TECHNIQUE

Oblique coronal, oblique sagittal, and axial images were obtained with variable T1 and T2 weighting.

COMPARISONS

None available

FINDINGS

There is a full-thickness tear at the anterior insertion of the rotator cuff tendon on the greater
tuberosity image 9 series 7. No retraction is seen. There is no evidence for atrophy.

The sub scapularis tendon is within normal limits. The bicipital tendon is unremarkable.

Labrum is within normal limits. Minor degenerative changes of the AC joint are noted.

IMPRESSION

4-5 millimeter full-thickness tear at the anterior insertion of the rotator cuff tendon on the
greater tuberosity.

Small shoulder effusion and minor degenerative changes of the AC joint.

Tech Notes:

RT SHOULDER PAIN AFTER A FALL.  LIMITED ROM.  RG

## 2021-07-22 IMAGING — CR SHOULDCMRT
3 series · 3 of 3 positions shown · non-contrast
Comparison: none

[shoulder external]
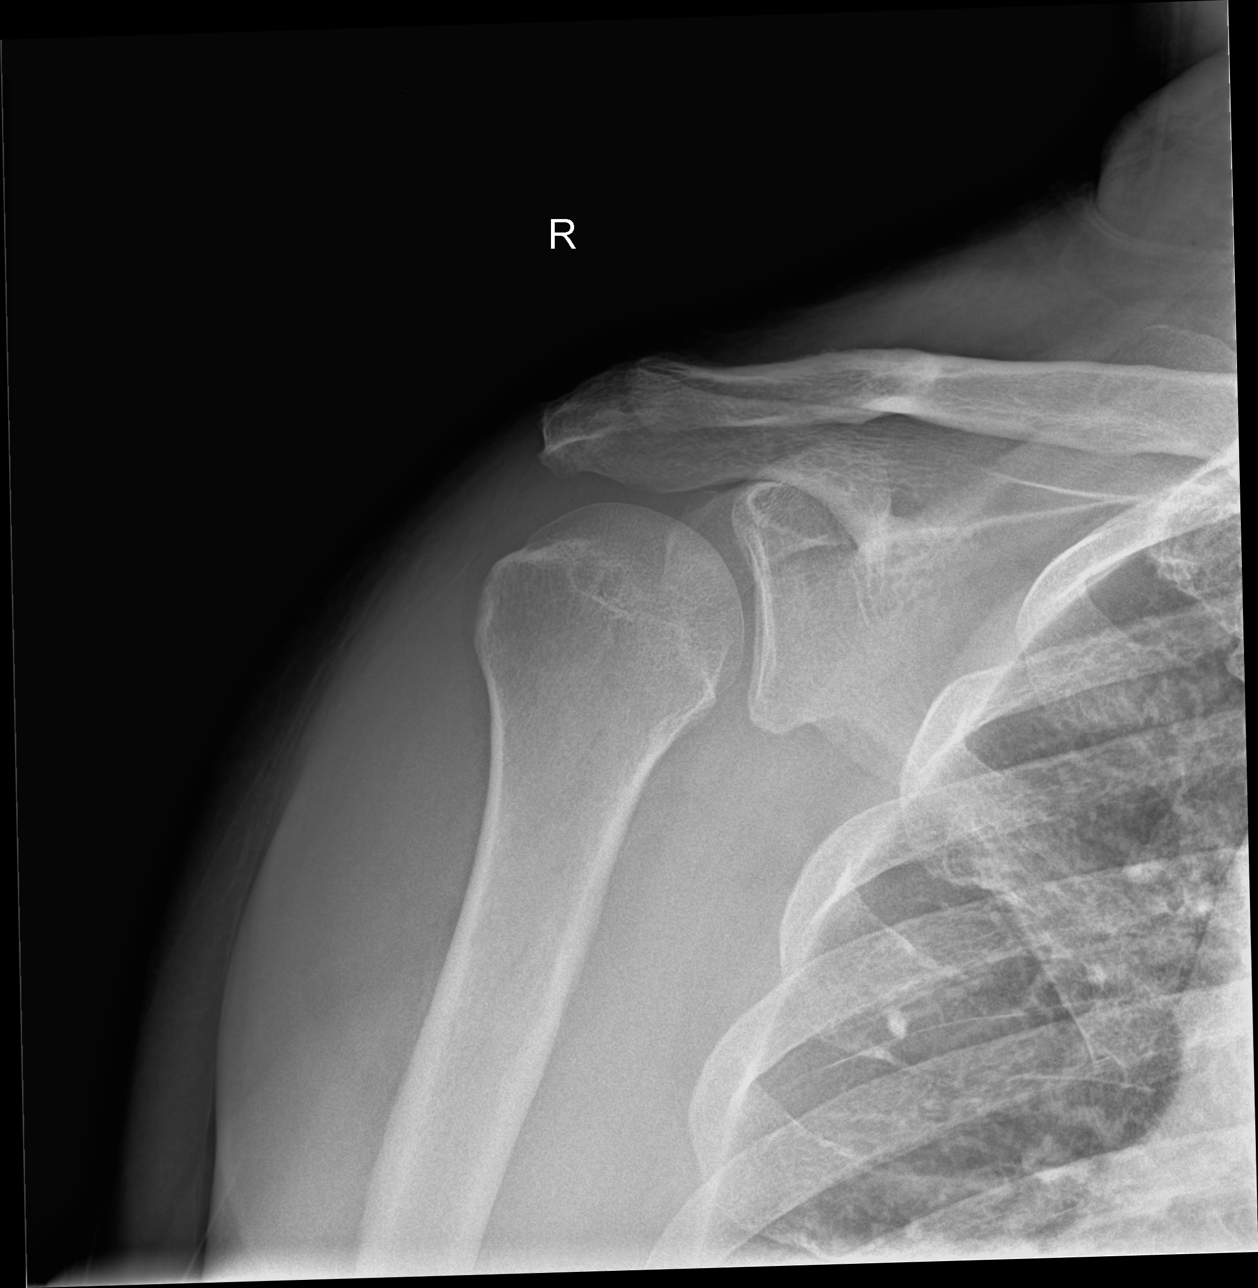

[shoulder y-view]
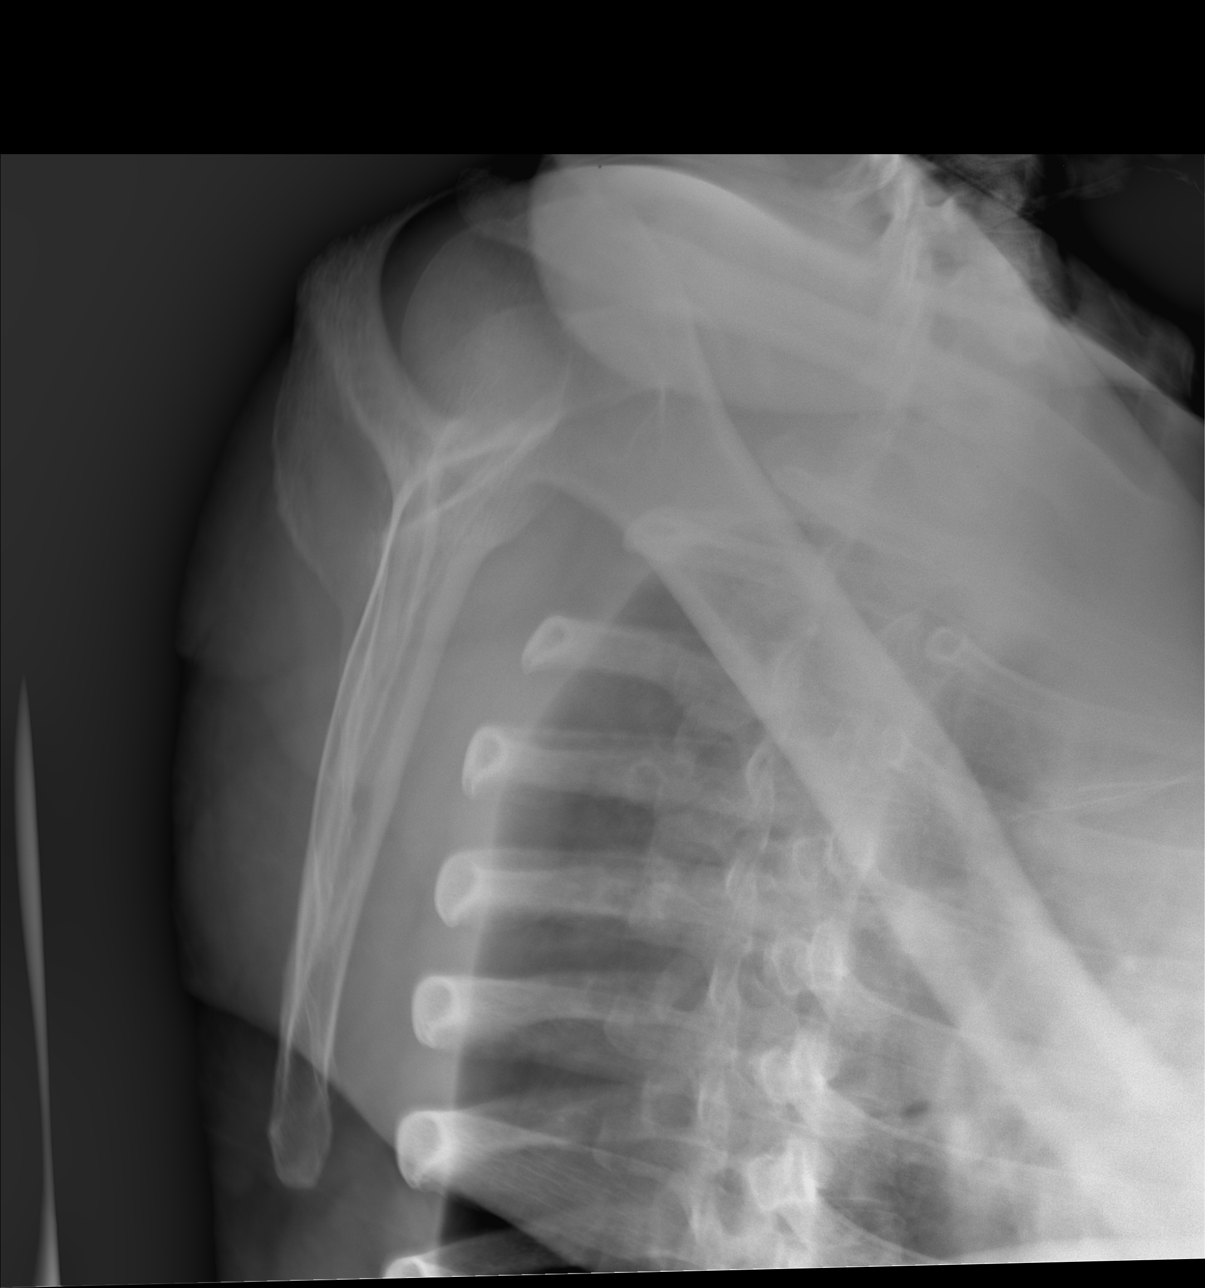

[shoulder axillary]
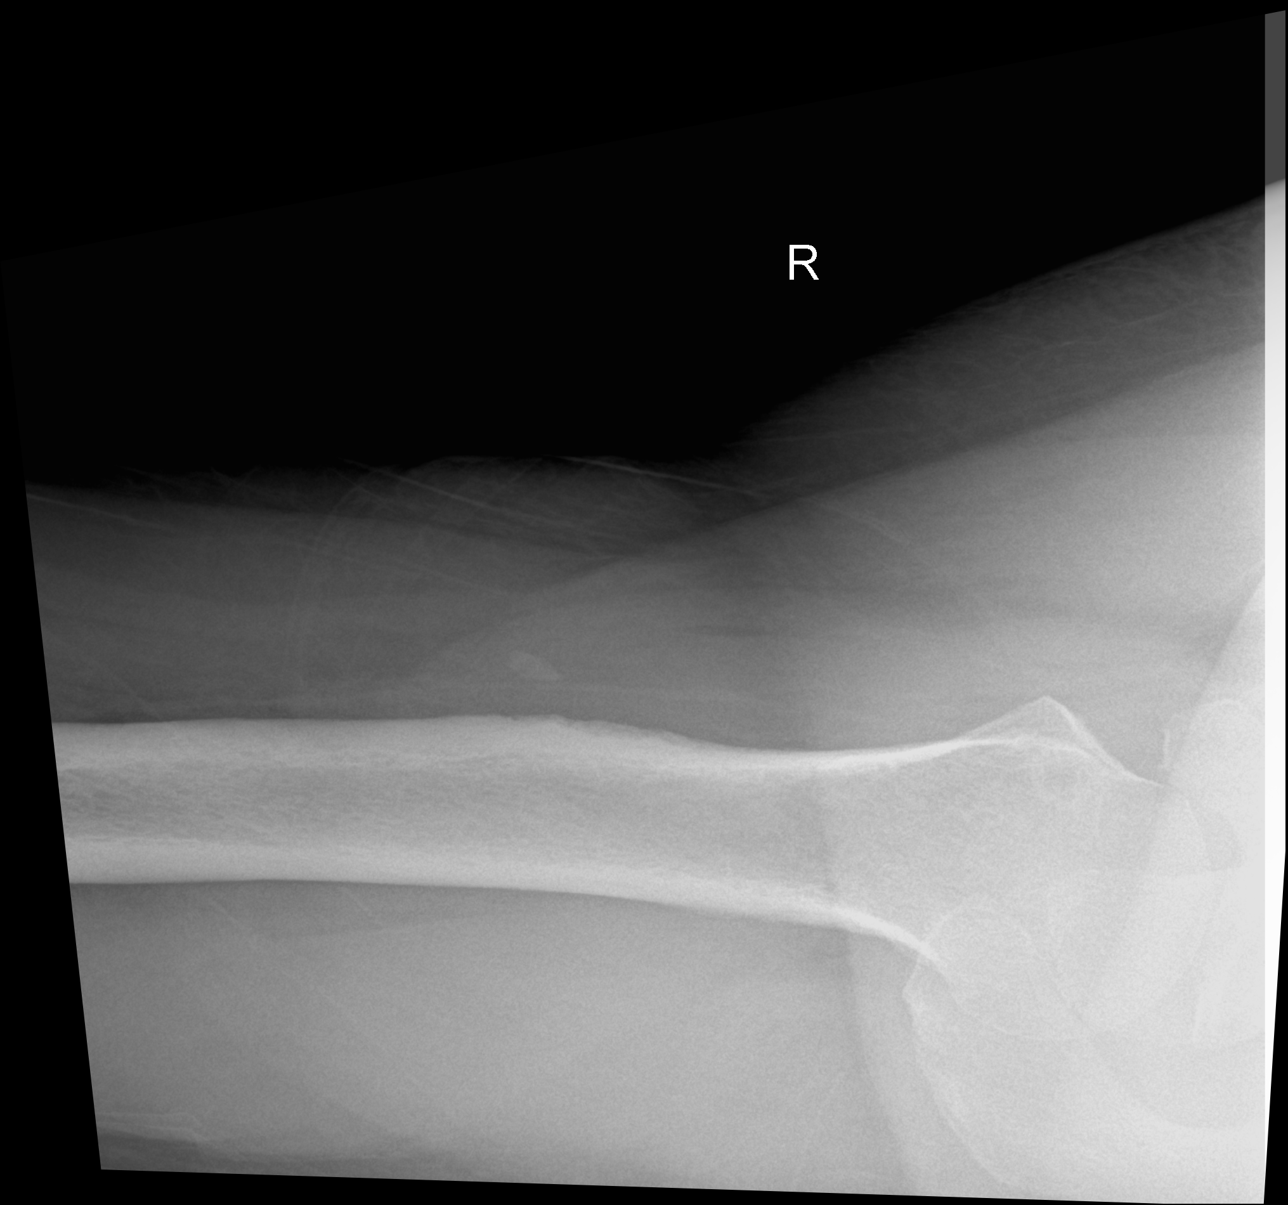

[3 of 3 positions shown; findings below may reference images not displayed]

DIAGNOSTIC STUDIES

EXAM

XR shoulder right, complete

INDICATION

Right Shoulder pain
rt shoulder pain after fall 2 months ago. stiffness and soreness

TECHNIQUE

AP lateral and axillary views

COMPARISONS

None available

FINDINGS

Mild degenerative changes of the AC joint are seen. No fractures are evident.

IMPRESSION

Mild degenerative changes of the AC joint.

Tech Notes:

rt shoulder pain after fall 2 months ago. stiffness and soreness

## 2021-07-22 NOTE — Progress Notes
Thomas Francis. Thomas Francis, Aug 17, 1970 has an appointment with PA Roberts Gaudy on 07/30/21.    Please send recent lab results for continuity of care.    Thank you,   [name]    Phone: [phone]  Fax: [phone]

## 2021-07-24 ENCOUNTER — Encounter: Admit: 2021-07-24 | Discharge: 2021-07-24 | Payer: MEDICARE

## 2021-07-24 MED ORDER — DULOXETINE 60 MG PO CPDR
ORAL_CAPSULE | 0 refills
Start: 2021-07-24 — End: ?

## 2021-07-30 ENCOUNTER — Encounter: Admit: 2021-07-30 | Discharge: 2021-07-30 | Payer: MEDICARE

## 2021-07-30 ENCOUNTER — Ambulatory Visit: Admit: 2021-07-30 | Discharge: 2021-07-31 | Payer: MEDICARE

## 2021-07-30 DIAGNOSIS — N1831 Stage 3a chronic kidney disease (HCC): Secondary | ICD-10-CM

## 2021-07-30 DIAGNOSIS — J45909 Unspecified asthma, uncomplicated: Secondary | ICD-10-CM

## 2021-07-30 DIAGNOSIS — G629 Polyneuropathy, unspecified: Secondary | ICD-10-CM

## 2021-07-30 DIAGNOSIS — R7989 Other specified abnormal findings of blood chemistry: Secondary | ICD-10-CM

## 2021-07-30 DIAGNOSIS — G4730 Sleep apnea, unspecified: Secondary | ICD-10-CM

## 2021-07-30 DIAGNOSIS — E039 Hypothyroidism, unspecified: Secondary | ICD-10-CM

## 2021-07-30 DIAGNOSIS — G4733 Obstructive sleep apnea (adult) (pediatric): Secondary | ICD-10-CM

## 2021-07-30 DIAGNOSIS — E119 Type 2 diabetes mellitus without complications: Secondary | ICD-10-CM

## 2021-07-30 DIAGNOSIS — J438 Other emphysema: Secondary | ICD-10-CM

## 2021-07-30 DIAGNOSIS — I10 Essential (primary) hypertension: Secondary | ICD-10-CM

## 2021-07-30 DIAGNOSIS — E669 Obesity, unspecified: Secondary | ICD-10-CM

## 2021-07-30 DIAGNOSIS — J449 Chronic obstructive pulmonary disease, unspecified: Secondary | ICD-10-CM

## 2021-07-30 MED ORDER — FUROSEMIDE 20 MG PO TAB
20 mg | ORAL_TABLET | ORAL | 0 refills | 90.00000 days | Status: AC | PRN
Start: 2021-07-30 — End: ?

## 2021-07-30 MED ORDER — METOPROLOL SUCCINATE 25 MG PO TB24
25 mg | ORAL_TABLET | Freq: Every evening | ORAL | 1 refills | 90.00000 days | Status: AC
Start: 2021-07-30 — End: ?

## 2021-07-30 NOTE — Progress Notes
Date of Service: 07/30/2021    Thomas Francis is a 51 y.o. male.       HPI     I had the pleasure of seeing Thomas Francis for a three month follow up visit in the Cardiovascular Medicine clinic at [facility] [employer].     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  He is followed by Dr. Neale Burly.   ?  Thomas Francis underwent a cardiac work up in 2020 for evaluation of chest pain. His regadenoson MPI on 07/07/18 showed a mildly depressed LV function EF of 42%, there was evidence of a inferior wall defect consistent with RCA ischemia.  He had a subsequent echocardiogram showing preserved LV function, EF of 55%.  No significant valvular abnormality.  Due to his abnormal stress test he was referred for cardiac cath on 07/14/18 showing normal coronary angiography, mild elevated LVEDP of 18 mmHg.  He was continued on medical therapy.     He had a repeat echocardiogram on 11/22/20 for evaluation of new RBBB which showing normal LV function, EF 50-55%, no RWMA, mild dilated RA, and no significant valvular disease.     I seen Thomas Francis on 05/09/21 with lost 116 pounds since his bariatric surgery.   He was seen by Dr. Suan Halter several weeks prior with transaminates with an AST of 191, ALP of 174.  He was taken off simvastatin with a repeat showing significant improvement in his AST of 66, ALP of 112.  His blood pressure was soft at 98/62. Coreg was reduced from 25 mg twice daily to 12.5 mg twice daily, lisinopril was reduced to 2.5 mg.     Unfortunately Thomas Francis had a mechanical fall in mid March after stumbling on a unlevel cement with a left rotator cuff injury.  He had surgery on July 22, 2021 per Dr. Para March.  He had his initial PT evaluation yesterday.  Overall he feels that he is doing well from a cardiac perspective.  He has had occasional positional lightheadedness.  He has been more concerned about an elevated resting heart rate, going up to 130s with minimal exertion.  ?         Vitals:    07/30/21 1419   BP: 110/60   BP Source: Arm, Right Upper   Pulse: 96   SpO2: 95%   PainSc: Zero   Weight: 129.5 kg (285 lb 8 oz)   Height: 170.2 cm (5' 7)     Body mass index is 44.72 kg/m?Marland Kitchen     Past Medical History  Patient Active Problem List    Diagnosis Date Noted   ? Erectile dysfunction 10/29/2020   ? RBBB 10/23/2020     2D Doppler Echo 11/22/20 --difficult study due to poor acoustic windows due to body habitus.  ? LV normal size, concentric remodeling.    ? LVSF normal, estimated 50-55%.     ? Normal DV  ? RV mildly dilated, preserved systolic function.  ? Mildly dilated RA.  Normal-sized LA.  ? No valvular disease identified, No PE  ? inadequate TR jet, unable to estimate PASP    ? Elevated CVP 5-10 mmHg.  ?Prior study dated 07/14/2018: limited images, no clinically significant interval change.     ? Severe episode of recurrent major depressive disorder, without psychotic features (HCC) 05/04/2019   ? 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 Peripheral neuropathy 09/07/2018   ? Chronic kidney disease (CKD) 09/07/2018   ? 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 Class 3 severe obesity due to excess calories with serious comorbidity and body mass index (BMI) of 60.0 to 69.9 in adult [facility]) 09/07/2018     Roux-en Y bariatric surgery in Jan. 2022.     ? Abnormal stress test 07/14/2018   ? Chest discomfort 06/28/2018     07/07/18- Regadeonson Thallium MPI:  EF 42 %. This study is technically difficult given patient's extreme body habitus.  However it does appear abnormal. There is a small sized mild intensity reversible perfusion abnormality seen in the inferior and inferolateral segments from the base to the mid ventricular cavity.  This is consistent with ischemia in the right coronary artery distribution.    07/14/18- Echo: EF 55%. Normal LV diastolic functionNormal RV size and function. Suboptimal visualization of the valve, however no significant valvular abnormality on color Doppler.Normal CVP & PA systolic pressure 33 mmHg.  Compared to prior TEE study dated 06/02/2017, LVEF is similar, no significant valvular abnormality was seen in the previous study and current study  07/14/18- Left Heart Catheterization: Normal coronary arteries with no evidence of any significant atherosclerosis     ? ATN (acute tubular necrosis) (HCC) 06/29/2017   ? Acute diffuse otitis externa of right ear 06/29/2017   ? Sleep apnea 06/29/2017   ? Bacteremia 06/17/2017     06/02/2017- TEE: EF of 60%. Normal right ventricular size and function. No significant valvular disease identified.No pericardial effusion. No evidence of endocarditis. No evidence of thrombus in the left atrium or left atrial appendage. Small patent foramen ovale (No atrial septal aneurysm)       ? Acute kidney injury (HCC) 06/17/2017   ? Dyspnea 06/17/2017   ? S/P TURP 06/17/2017   ? COPD (chronic obstructive pulmonary disease) (HCC) 06/17/2017   ? Essential hypertension 05/28/2017     06/19/2017- Rega MPI: EF 66%. This study is normal and represents low probability for significant inducible myocardial ischemia. There are no perfusion abnormalities identified on this study. All myocardial segments appear viable. Regional and global left ventricular function are normal. High risk scintigraphic features are absent.      ? Chronic low back pain 05/28/2017   ? Hyperglycemia without ketosis 05/28/2017   ? Type 2 diabetes mellitus (HCC) 05/27/2017         Review of Systems   Constitutional: Negative.   HENT: Negative.    Eyes: Negative.    Cardiovascular: Negative.    Respiratory: Negative.    Endocrine: Negative.    Hematologic/Lymphatic: Negative.    Skin: Negative.    Gastrointestinal: Negative.    Genitourinary: Negative.    Neurological: Positive for light-headedness.   Psychiatric/Behavioral: Negative.    Allergic/Immunologic: Negative.        Physical Exam  General Appearance: no acute distress  Skin: warm & intact  HEENT: unremarkable  Neck Veins: neck veins are flat & not distended  Carotid Arteries: no bruits  Chest Inspection: chest is normal in appearance  Auscultation/Percussion: lungs clear to auscultation, no rales, rhonchi, or wheezing  Cardiac Rhythm: regular rhythm & normal rate  Cardiac Auscultation: Normal S1 & S2, no S3 or S4, no rub  Murmurs: no cardiac murmurs   Extremities: no lower extremity edema; 2+ symmetric distal pulses  Abdominal Exam: soft, non-tender, no masses, bowel sounds normal  Liver &  Spleen: no organomegaly  Neurologic Exam: oriented to time, place and person; no focal neurologic deficits  Psychiatric: Normal mood and affect.  Behavior is normal. Judgment and thought content normal.       Cardiovascular Health Factors  Vitals BP Readings from Last 3 Encounters:   07/30/21 110/60   07/11/21 120/80   07/04/21 126/72     Wt Readings from Last 3 Encounters:   07/30/21 129.5 kg (285 lb 8 oz)   07/11/21 126.6 kg (279 lb)   07/04/21 127 kg (280 lb)     BMI Readings from Last 3 Encounters:   07/30/21 44.72 kg/m?   07/11/21 43.70 kg/m?   07/04/21 43.85 kg/m?      Smoking Social History     Tobacco Use   Smoking Status Never   Smokeless Tobacco Former   ? Types: Chew   ? Quit date: 1990      Lipid Profile Cholesterol   Date Value Ref Range Status   04/24/2021 125  Final     HDL   Date Value Ref Range Status   04/24/2021 58  Final     LDL   Date Value Ref Range Status   04/24/2021 46  Final     Triglycerides   Date Value Ref Range Status   04/24/2021 109  Final      Blood Sugar Hemoglobin A1C   Date Value Ref Range Status   04/24/2021 5.4  Final     Glucose   Date Value Ref Range Status   05/09/2021 82  Final   04/24/2021 63 (L) 70 - 105 Final   01/09/2019 160 (H) 70 - 100 MG/DL Final     Glucose, POC   Date Value Ref Range Status   01/20/2019 133 (H) 70 - 100 MG/DL Final   21/30/8657 846 (H) 70 - 100 MG/DL Final 96/29/5284 132 (H) 70 - 100 MG/DL Final     Glucose POC   Date Value Ref Range Status   01/17/2019 315 (A) 65 - 110 mg/dL Final   44/02/270 536 (A) 65 - 110 mg/dL Final          Problems Addressed Today  Encounter Diagnoses   Name Primary?   ? Stage 3a chronic kidney disease (HCC) Yes   ? Class 3 severe obesity due to excess calories with serious comorbidity and body mass index (BMI) of 60.0 to 69.9 in adult [facility])    ? Other emphysema (HCC)    ? Essential hypertension        Assessment and Plan     1.  Primary hypertension.  Thomas Francis's blood pressure remains controlled at 110/60.  We had reduced his carvedilol from 25 mg twice a day to 12.5 mg twice a day several months ago due to soft blood pressure readings.  He has a slight elevated resting heart rate of 96.  I have switched carvedilol to Metoprolol XL 25 mg at at bedtime.  Thomas Francis is to call in 2 weeks with his blood pressure readings and resting heart rates.  I have also changed furosemide 40 mg daily to 20 mg as needed for peripheral edema.  He remains euvolemic on today's exam.  I made no further changes and lisinopril 2.5.    2.  Morbid obesity.  He initially was at 43 and has done well post bariatric surgery in January 2022.  His goal weight is to 15 pounds.  I suspect as he continues to lose weight we will need to  adjust antihypertensive agents.    3.  Abnormal stress test in May 2020 with normal coronary angiography per cardiac catheterization.    4.  Diabetic mellitus type II.  He is followed by his PCP for glycemic control.  He remains on metformin 1000 mg twice daily and Jardiance 25 mg.      5.  Mechanical fall in March with subsequent left rotator cuff injury, post repair on July 22, 2021.  He is looking forward to physical therapy.      He will follow-up with Dr. Neale Burly in 6 months.    Thank you for allowing me to participate in Thomas Francis''s s care.  Please feel free to contact me if I can be of further assistance.    Roberts Gaudy PA-c Current Medications (including today's revisions)  ? albuterol sulfate (PROAIR HFA) 90 mcg/actuation HFA aerosol inhaler INHALE 2 PUFFS BY MOUTH FOUR TIMES DAILY AS NEEDED FOR SHORTNESS OF BREATH   ? alfuzosin (UROXATRAL) 10 mg tablet Take one tablet by mouth daily.   ? alprostadil 20 mcg/mL soln IC inj(cmpd) Inject five mcg into base of penis as directed as Needed. Max 1 dose/ 24-48 hrs.   ? aspirin EC 81 mg tablet Take one tablet by mouth daily. Take with food.   ? buPROPion XL (WELLBUTRIN XL) 300 mg tablet Take one tablet by mouth every morning. Do not crush or chew.   ? cholecalciferol (vitamin D3) (OPTIMAL D3) 50,000 units capsule TAKE ONE CAPSULE BY MOUTH WEEKLY ON FRIDAY   ? diclofenac (VOLTAREN) 1 % topical gel Apply four g topically to affected area four times daily. (Patient taking differently: Apply four g topically to affected area four times daily as needed.)   ? docusate (COLACE) 100 mg capsule Take one capsule by mouth daily.   ? duloxetine DR (CYMBALTA) 60 mg capsule Take one capsule by mouth daily.   ? fluticasone propionate (FLONASE) 50 mcg/actuation nasal spray Apply one spray to each nostril as directed twice daily. Shake bottle gently before using.   ? fluticasone propionate (FLOVENT HFA) 110 mcg/actuation inhaler Inhale two puffs by mouth into the lungs twice daily.   ? FREESTYLE LIBRE 2 SENSOR sensor Use one each as directed before meals and at bedtime. Use as directed.  Indications: type 2 diabetes mellitus   ? furosemide (LASIX) 20 mg tablet Take one tablet by mouth as Needed. As needed once daily for peripheral edema   ? gabapentin (NEURONTIN) 800 mg tablet Take one tablet by mouth three times daily.   ? guaiFENesin LA (MUCINEX) 600 mg tablet Take one tablet by mouth twice daily.   ? hydrOXYzine HCL (ATARAX) 25 mg tablet Take one tablet by mouth four times daily as needed for Anxiety.   ? ipratropium bromide (ATROVENT) 0.02 % nebulizer solution Inhale 2.5 mL by mouth into the lungs four times daily as needed for Shortness of Breath or Wheezing.   ? JARDIANCE 25 mg tablet Take one tablet by mouth daily.   ? lactobacillus rhamnosus (GG) (CULTURELLE) 10 billion cell cap Take one capsule by mouth daily with breakfast.   ? LEVEMIR FLEXPEN 100 unit/mL (3 mL) injection pen Inject sixty Units under the skin at bedtime daily.   ? levocetirizine (XYZAL) 5 mg tablet Take one tablet by mouth daily.   ? levothyroxine (SYNTHROID) 50 mcg tablet Take one tablet by mouth daily 30 minutes before breakfast.   ? lisinopril (ZESTRIL) 2.5 mg tablet Take one tablet by mouth daily.   ?  LORazepam (ATIVAN) 1 mg tablet Take one-half tablet to one tablet by mouth twice daily as needed for Other... (severe anxiety or panic attacks).   ? meloxicam (MOBIC) 15 mg tablet Take one tablet by mouth daily.   ? metFORMIN-XR (GLUCOPHAGE XR) 500 mg extended release tablet Take two tablets by mouth twice daily with meals.   ? metoprolol succinate XL (TOPROL XL) 25 mg extended release tablet Take one tablet by mouth at bedtime daily.   ? Needle (Disp) 27 G 27 gauge x 1/2 ndle Use as directed.   ? ondansetron (ZOFRAN ODT) 4 mg rapid dissolve tablet as Needed.   ? OZEMPIC 1 mg/dose (4 mg/3 mL) injection PEN Inject one mg under the skin every 7 days.   ? phentermine (ADIPEX-P) 37.5 mg capsule Take one capsule by mouth daily.   ? syringe (disposable) 1 mL Use as directed.   ? tadalafiL (CIALIS) 5 mg tablet Take one tablet by mouth daily.   ? testosterone cypionate (DEPO-TESTOSTERONE) 200 mg/mL injection Inject 1 mL into the muscle every 14 days.   ? tiZANidine (ZANAFLEX) 4 mg tablet Take one tablet by mouth every 6 hours as needed.   ? vitamins, multiple tablet Take one tablet by mouth daily.   ? zolpidem (AMBIEN) 5 mg tablet TAKE ONE TABLET BY MOUTH AT BEDTIME AS NEEDED FOR SLEEP

## 2021-08-29 ENCOUNTER — Ambulatory Visit: Admit: 2021-08-29 | Discharge: 2021-08-29 | Payer: MEDICARE

## 2021-08-29 ENCOUNTER — Encounter: Admit: 2021-08-29 | Discharge: 2021-08-29 | Payer: MEDICARE

## 2021-08-29 ENCOUNTER — Inpatient Hospital Stay: Admit: 2021-08-29 | Discharge: 2021-08-29 | Payer: MEDICARE

## 2021-08-29 DIAGNOSIS — F603 Borderline personality disorder: Secondary | ICD-10-CM

## 2021-08-29 DIAGNOSIS — N401 Enlarged prostate with lower urinary tract symptoms: Secondary | ICD-10-CM

## 2021-08-29 DIAGNOSIS — N369 Urethral disorder, unspecified: Secondary | ICD-10-CM

## 2021-08-29 DIAGNOSIS — R7989 Other specified abnormal findings of blood chemistry: Secondary | ICD-10-CM

## 2021-08-29 DIAGNOSIS — E119 Type 2 diabetes mellitus without complications: Secondary | ICD-10-CM

## 2021-08-29 DIAGNOSIS — E039 Hypothyroidism, unspecified: Secondary | ICD-10-CM

## 2021-08-29 DIAGNOSIS — I10 Essential (primary) hypertension: Secondary | ICD-10-CM

## 2021-08-29 DIAGNOSIS — G4730 Sleep apnea, unspecified: Secondary | ICD-10-CM

## 2021-08-29 DIAGNOSIS — G4733 Obstructive sleep apnea (adult) (pediatric): Secondary | ICD-10-CM

## 2021-08-29 DIAGNOSIS — E1143 Type 2 diabetes mellitus with diabetic autonomic (poly)neuropathy: Secondary | ICD-10-CM

## 2021-08-29 DIAGNOSIS — G629 Polyneuropathy, unspecified: Secondary | ICD-10-CM

## 2021-08-29 DIAGNOSIS — F439 Reaction to severe stress, unspecified: Secondary | ICD-10-CM

## 2021-08-29 DIAGNOSIS — J45909 Unspecified asthma, uncomplicated: Secondary | ICD-10-CM

## 2021-08-29 DIAGNOSIS — J449 Chronic obstructive pulmonary disease, unspecified: Secondary | ICD-10-CM

## 2021-08-29 DIAGNOSIS — E669 Obesity, unspecified: Secondary | ICD-10-CM

## 2021-08-29 DIAGNOSIS — F332 Major depressive disorder, recurrent severe without psychotic features: Secondary | ICD-10-CM

## 2021-08-29 LAB — URINALYSIS, MICROSCOPIC

## 2021-08-29 LAB — URINALYSIS DIPSTICK
NITRITE: NEGATIVE
URINE ASCORBIC ACID, UA: NEGATIVE
URINE BILE: NEGATIVE
URINE BLOOD: NEGATIVE

## 2021-08-29 MED ORDER — LIDOCAINE HCL 2 % MM JELP
Freq: Once | TOPICAL | 0 refills | Status: CP
Start: 2021-08-29 — End: ?
  Administered 2021-08-29: 15:00:00 11.000 mL via TOPICAL

## 2021-08-29 MED ORDER — WATER FOR INJECTION, STERILE IV SOLP
1000 mL | Freq: Once | 0 refills | Status: CP
Start: 2021-08-29 — End: ?
  Administered 2021-08-29: 15:00:00 1000 mL

## 2021-08-29 MED ORDER — LORAZEPAM 1 MG PO TAB
.5 mg | ORAL_TABLET | Freq: Two times a day (BID) | ORAL | 1 refills | Status: CN | PRN
Start: 2021-08-29 — End: ?

## 2021-08-29 MED ORDER — CEFTRIAXONE INJ 1GM IVP
1 g | Freq: Once | INTRAVENOUS | 0 refills
Start: 2021-08-29 — End: ?

## 2021-08-29 MED ORDER — HYDROXYZINE HCL 25 MG PO TAB
25 mg | ORAL_TABLET | Freq: Four times a day (QID) | ORAL | 3 refills | 30.00000 days | Status: AC | PRN
Start: 2021-08-29 — End: ?

## 2021-08-29 MED ORDER — DULOXETINE 30 MG PO CPDR
90 mg | ORAL_CAPSULE | Freq: Every morning | ORAL | 3 refills | 60.00000 days | Status: AC
Start: 2021-08-29 — End: ?

## 2021-08-29 MED ORDER — LORAZEPAM 1 MG PO TAB
.5 mg | ORAL_TABLET | Freq: Two times a day (BID) | ORAL | 2 refills | 12.00000 days | Status: AC | PRN
Start: 2021-08-29 — End: ?

## 2021-08-29 MED ORDER — BUPROPION XL 300 MG PO TB24
300 mg | ORAL_TABLET | Freq: Every morning | ORAL | 3 refills | Status: AC
Start: 2021-08-29 — End: ?

## 2021-08-29 NOTE — Procedures
Procedure: Flexible Cystoscopy  Date: 08/29/2021  Indication: bph/luts    The risks and benefits of this procedure including bleeding, severe urinary tract infection, urosepsis, burning with urination, and urethral stricture formation were discussed. The patient wishes to proceed.    Procedure:   Universal time out confirming pt ID and procedure perfomed. Following sterile prep and topical lidocaine gel anesthesia, endoscopy was performed with a 21fr flexible cystoscope.  The anterior and posterior urethra were inspected with no evidence of stricture, tumor, or obstruction.  Sphincter was poorly co-apted prior to passing scope.  The prostatic urethra from verumontanum to bladder neck was approximately 3cm in length with bilobar obstruction and anterior tissue.  There is some undermined bladder neck w/ small papillary lesion as well as an anterior papillary lesion at the bladder neck.  no median lobe was present.  The bladder revealed no evidence of tumor, stone, or diverticulum.  There was no specific erythema concerning for CIS.  A bladder barbitage was sent for cytology.  There was no evidence of disease at the bladder neck on retroflexion.  The urethra was evaluated in retrograde fashion and previous findings were confirmed.  The patient tolerated the procedure well.

## 2021-08-29 NOTE — Progress Notes
CC: Thomas Francis [id number]), is a 51 y.o. male referred for consultation by Beather Arbour, MD because of: LUTS/ED on 07/11/2021.    PCP:  Hoyt Koch    Subjective    HPI:  The patient is a 51 y.o. 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  Was on flomax but self stopped 2/2 deceased ejaculations.  Noted his urination has gotten worse since this time.  No hematuria and dysuria.    Has tried c5, v100 on top, ICI alprostadil, VED, nothing has given him adequate penetration quality erections that last.  Notes w/ ICI had distal tapering but even base was not adequate for erections.      ----------------------------------------------------------------------------------------------------------------------------------  08/29/2021: Returned today for cystoscopy see note.  No changes in symptoms still w/ hesitancy.             Medical / Surgical / Social / Family History Reviewed:   has a past medical history of Asthma, COPD (chronic obstructive pulmonary disease) (HCC), DM (diabetes mellitus) (HCC), Hypertension, Hypothyroid, Low testosterone, Morbid obesity with BMI of 50.0-59.9, adult (HCC) (06/17/2017), Neuropathy (2010), Obesity, Obstructive sleep apnea, and Sleep apnea.   has a past surgical history that includes tonsillectomy; Abscess drainage (N/A, 06/01/2017); Abscess drainage (Right, 12/2016); Upper gastrointestinal endoscopy (N/A, 01/20/2019); and Upper gastrointestinal endoscopy (N/A, 01/20/2019).   reports that he has never smoked. He quit smokeless tobacco use about 33 years ago.  His smokeless tobacco use included chew. He reports current alcohol use. He reports that he does not currently use drugs.   family history includes Heart Disease in his father; None Reported in his mother.    Allergies / Medications Reviewed:  Allergies Allergen Reactions   ? Vancomycin SEE COMMENTS     PT HAD AKI KIDNEY FAILURE FROM VANCOMYCIN   ? Ketamine SEE COMMENTS     STOPPED BREATHING WHEN GIVEN IV TOO FAST  STOPPED BREATHING WHEN GIVEN IV TOO FAST         Current Outpatient Medications:   ?  albuterol sulfate (PROAIR HFA) 90 mcg/actuation HFA aerosol inhaler, INHALE 2 PUFFS BY MOUTH FOUR TIMES DAILY AS NEEDED FOR SHORTNESS OF BREATH, Disp: , Rfl:   ?  alfuzosin (UROXATRAL) 10 mg tablet, Take one tablet by mouth daily., Disp: 90 tablet, Rfl: 3  ?  alprostadil 20 mcg/mL soln IC inj(cmpd), Inject five mcg into base of penis as directed as Needed. Max 1 dose/ 24-48 hrs., Disp: 10 mL, Rfl: 11  ?  aspirin EC 81 mg tablet, Take one tablet by mouth daily. Take with food., Disp: , Rfl:   ?  buPROPion XL (WELLBUTRIN XL) 300 mg tablet, Take one tablet by mouth every morning. Do not crush or chew., Disp: 30 tablet, Rfl: 2  ?  cholecalciferol (vitamin D3) (OPTIMAL D3) 50,000 units capsule, TAKE ONE CAPSULE BY MOUTH WEEKLY ON FRIDAY, Disp: , Rfl:   ?  diclofenac (VOLTAREN) 1 % topical gel, Apply four g topically to affected area four times daily. (Patient taking differently: Apply four g topically to affected area four times daily as needed.), Disp: 300 g, Rfl: 3  ?  docusate (COLACE) 100 mg capsule, Take one capsule by mouth daily., Disp: , Rfl:   ?  duloxetine DR (CYMBALTA) 60 mg capsule, Take one capsule by mouth daily., Disp: 30  capsule, Rfl: 0  ?  fluticasone propionate (FLONASE) 50 mcg/actuation nasal spray, Apply one spray to each nostril as directed twice daily. Shake bottle gently before using., Disp: , Rfl:   ?  fluticasone propionate (FLOVENT HFA) 110 mcg/actuation inhaler, Inhale two puffs by mouth into the lungs twice daily., Disp: , Rfl:   ?  FREESTYLE LIBRE 2 SENSOR sensor, Use one each as directed before meals and at bedtime. Use as directed.  Indications: type 2 diabetes mellitus, Disp: 6 each, Rfl: 3  ?  furosemide (LASIX) 20 mg tablet, Take one tablet by mouth as Needed. As needed once daily for peripheral edema, Disp: 90 tablet, Rfl: 0  ?  gabapentin (NEURONTIN) 800 mg tablet, Take one tablet by mouth three times daily., Disp: , Rfl:   ?  guaiFENesin LA (MUCINEX) 600 mg tablet, Take one tablet by mouth twice daily., Disp: 180 tablet, Rfl: 0  ?  hydrOXYzine HCL (ATARAX) 25 mg tablet, Take one tablet by mouth four times daily as needed for Anxiety., Disp: 120 tablet, Rfl: 2  ?  ipratropium bromide (ATROVENT) 0.02 % nebulizer solution, Inhale 2.5 mL by mouth into the lungs four times daily as needed for Shortness of Breath or Wheezing., Disp: , Rfl:   ?  JARDIANCE 25 mg tablet, Take one tablet by mouth daily., Disp: , Rfl:   ?  lactobacillus rhamnosus (GG) (CULTURELLE) 10 billion cell cap, Take one capsule by mouth daily with breakfast., Disp: 90 capsule, Rfl: 0  ?  LEVEMIR FLEXPEN 100 unit/mL (3 mL) injection pen, Inject sixty Units under the skin at bedtime daily., Disp: , Rfl:   ?  levocetirizine (XYZAL) 5 mg tablet, Take one tablet by mouth daily., Disp: , Rfl:   ?  levothyroxine (SYNTHROID) 50 mcg tablet, Take one tablet by mouth daily 30 minutes before breakfast., Disp: , Rfl:   ?  lisinopril (ZESTRIL) 2.5 mg tablet, Take one tablet by mouth daily., Disp: 90 tablet, Rfl: 3  ?  LORazepam (ATIVAN) 1 mg tablet, Take one-half tablet to one tablet by mouth twice daily as needed for Other... (severe anxiety or panic attacks)., Disp: 60 tablet, Rfl: 1  ?  meloxicam (MOBIC) 15 mg tablet, Take one tablet by mouth daily., Disp: , Rfl:   ?  metFORMIN-XR (GLUCOPHAGE XR) 500 mg extended release tablet, Take two tablets by mouth twice daily with meals., Disp: , Rfl:   ?  metoprolol succinate XL (TOPROL XL) 25 mg extended release tablet, Take one tablet by mouth at bedtime daily., Disp: 90 tablet, Rfl: 1  ?  Needle (Disp) 27 G 27 gauge x 1/2 ndle, Use as directed., Disp: 20 each, Rfl: 11  ?  ondansetron (ZOFRAN ODT) 4 mg rapid dissolve tablet, as Needed., Disp: , Rfl:   ?  OZEMPIC 1 mg/dose (4 mg/3 mL) injection PEN, Inject one mg under the skin every 7 days., Disp: , Rfl:   ?  phentermine (ADIPEX-P) 37.5 mg capsule, Take one capsule by mouth daily., Disp: , Rfl:   ?  syringe (disposable) 1 mL, Use as directed., Disp: 12 each, Rfl: 11  ?  tadalafiL (CIALIS) 5 mg tablet, Take one tablet by mouth daily., Disp: 30 tablet, Rfl: 11  ?  testosterone cypionate (DEPO-TESTOSTERONE) 200 mg/mL injection, Inject 1 mL into the muscle every 14 days., Disp: , Rfl:   ?  tiZANidine (ZANAFLEX) 4 mg tablet, Take one tablet by mouth every 6 hours as needed., Disp: 15 tablet, Rfl: 3  ?  vitamins, multiple tablet, Take one tablet by mouth daily., Disp: , Rfl:   ?  zolpidem (AMBIEN) 5 mg tablet, TAKE ONE TABLET BY MOUTH AT BEDTIME AS NEEDED FOR SLEEP, Disp: 30 tablet, Rfl: 0     Review of systems (X and or explanation = positive, blank box = negative):  General Urologic   Fevers  Hematuria    Unintentional weight loss  Dysuria    Fatigue x Urinary frequency x   Blindness  Urinary urgency    Blurred vision  Urinary incontinence    History of a heart attack  Incomplete bladder emptying x   Heart murmur  Nocturia x   Bone pain  Weak stream x   Fractures  Straining x   Focal weakness  Abdominal pain    Stroke  Bladder pain    Melanoma  Kidney pain x   Basal cell carcinoma  Scrotal Pain X (mild compared to other pain he has - lots of neuropathy)   Diabetes x History of urologic cancer    Thyroid Disease x History of urologic surgery    Clotting Problem  History of hernia / hernia repair    Bleeding problem  History of urologic trauma    Pulmonary embolus  History of kidney stones    Congestive heart failure  History of urinary tract infections    Rectal bleeding  History of sexually transmitted diseases    Reflux  Family history of bleeding/clotting problems      Lifestyle Data:  Domain  Score Notes   STOP BANG  OSA if > 2 consider discussing w/PCP for sleep evaluation   PHQ-2  2 if > 2 consider discussing w/ PCP for formal depression screen   Diet Score  Q1: 0-1=3, 2-3=2, 4-5=1, 5-6=0 Q2: 0-1=0, 2-3=1, 4-5=2, 5-6=3 2 if > 2 consider discussing offer diet recommendations   Activity Level  Average minutes of activity per representative week Light 180  Moderate 60  Vigorous 90  If < 150 moderate, <75 vigorous, or patient interested offer exercise recommendations     He is able to walk 4 blocks or 2 flights of stairs without chest pain or significant shortness of breath.       Objective     Physical exam:  Vitals: BP 127/85 (BP Source: Arm, Right Upper, Patient Position: Sitting)  - Pulse 119  - Temp 37.2 ?C (98.9 ?F) (Temporal)  - Resp 22  - Ht 170.2 cm (5' 7)  - Wt 129.5 kg (285 lb 9.6 oz)  - SpO2 96%  - BMI 44.73 kg/m?   General appearance: Well-appearing, healthy male in no apparent distress  Skin: Normal appearance, no excessive sebum production noted.  Mental Status: Alert and oriented, normal affect.  Neuro:  Grossly intact  HEENT: Normocephalic, atraumatic.  Neck: Normal.    Chest: Non tender, no gynecomastia  Abdomen: Soft, non-tender, no masses or organomegaly. Bladder is not palpable. No costovertebral tenderness. No evidence of inguinal hernia.  Extremities: No peripheral edema    Penis: Penis is circumcised with + 2x1cm distal dorsal plaque, secondarily buried even with stretch + pre-pubic fat pad. Urethral meatus is normal.  Scrotum: Scrotum is grossly normal.  Testes: Descended bilaterally no evidence of masses or tenderness.      Data review:  Labs:  Testosterone:  No results found for: TESTOSTER, TESTERTOTLCM     Urine Dip:  Lab Results   Component Value Date    URBLOODPOC Negative 07/11/2021    URGLUCPOC 2+ (A)  07/11/2021    URLEUKPOC Negative 07/11/2021    URNITRIPOC Negative 07/11/2021    URPROTPOC Negative 07/11/2021    UROBILPOC 0.2 07/11/2021    UBLD NEG 07/17/2020     Urine Micro:  Lab Results   Component Value Date    UWBC 2-10 07/17/2020    URBC 0-2 07/17/2020    URBC 2-10 06/20/2017 URBC PACKED 06/17/2017    UMUC TRACE 07/17/2020    UBACT FEW (A) 06/17/2017    USQUCELLS 0-2 07/17/2020       No results found for: Lebron Quam, URINECULT   PSA:  Lab Results   Component Value Date    PSA 0.15 06/25/2017     Creatinine:  Lab Results   Component Value Date    CR 1.07 05/09/2021    CR 1.09 04/24/2021    CR 0.91 01/09/2019       Hematocrit:  Lab Results   Component Value Date    HCT 53.1 05/09/2021    HCT 55.5 (H) 04/24/2021    HCT 36.4 (L) 07/12/2018     STI panel:  Lab Results   Component Value Date    NGONORPCR NEG 05/28/2017    CHLTRACPCR NEG 05/28/2017       Hemoglobin A1c:  Lab Results   Component Value Date    HGBA1C 5.4 04/24/2021    HGBA1C 8.8 (H) 06/25/2017    HGBA1C 11.3 (H) 05/30/2017       UDS 2023:  UROFLOW:  Qmax (mL/ s): 14.3  Qavg (mL/ s):  6.8  Voided volume (mL): 139  PVR (mL):  15  Curve: intermittent, prolonged    FILL:  First sensation (mL): 495  First desire (mL): 710  Strong desire (mL): 847  Capacity: 879   DO: mild    VOID:  Qmax (ml/s): 29  Qavg (mL/ s):  10  Pdet @ Qmax (cm H20): 110  Pdetmax (cm H20): 185  Voided volume (mL): 819  Pvr (mL): 61    EMG: synnergic      ASSESSMENT:  1.  High pressure, low flow voiding pattern with preserved detrusor funciton; large capacity hyposensate bladder    Imaging Studies:  No results found for this or any previous visit from the past 365 days.      Pathology:  Noncontributory        Medical Decision Making:    DISCUSSION:  We discussed treatment options for lower urinary tract symptoms secondary to benign prostatic hyperplasia (BPH). We discussed possible surgical interventions including Aquablation, ejaculatory hood sparing photovaporization, Urolift as well as non ejaculatory sparing options: traditional TURP and HOLEP/Simple prostatectomy (that my partners preform).  We discussed and compared the differences in these procedures in regards to 1) ability to improve lower urinary tract symptoms 2) bleeding risks 3) possible effects on sexual function (e.g. retrograde ejaculation) 4) temporary or permanent incontinence including urgency and stress types of incontinence 5) hospital duration vs outpatient nature 5) length of catheter stay.  We also discussed the risks of infection, need for transfusion, bladder/urethral injury, and need for prolonged catheterization. We focused our conversation on TURP including the potential risks of bleeding requiring transfusion, overnight vs outpatient approaches.    We discussed TURP given anterior prostate tissue + lesion which required biopsy at minimum.          ASSESSMENT:   51 y.o. 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  Was on flomax but self stopped 2/2 deceased ejaculations.  Noted his urination has gotten worse since this time.  No hematuria and dysuria.    Has tried c5, v100 on top, ICI alprostadil, VED, nothing has given him adequate penetration quality erections that last.  Notes w/ ICI had distal tapering but even base was not adequate for erections    1. 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 Peyronie's disease with distal tapering only, no curve, unclear when/if active  3. LUTS combined storage and voiding sx w/ UDS showing obstruction unclear gland size, had hx of TURP 2019 for abscess of 17g, some success w/ flomax but self dc'd bc of lack of ejaculation, found to have bladder neck lesion on cysto  4. Aspermia 2/2 meds and/or DM      PLAN:  UA/PVR  TUR bladder neck, TURP    Alfuzosin (stop flomax) wont take w/ c5  Will continue C5  Consider repeat ICI trial after weight loss, is interested in IPP but given anatomy is not currently a good candidate        RETURN TO CLINIC in 1.5 months or sooner if warranted, symptoms change, or concern arises regarding the aforementioned or new symptoms.    Billing:  Same Day pre-visit chart review: 10  Encounter with patient including counseling: 28  Post-visit reconciliation: 5  I spent total duration of 43 minutes on this patient care.    Specifically, the direct counseling included a discussion of each of the diagnoses above, the items of the detailed assessment and plan above, and discussion of the prognoses, treatment plans available, their risks, benefits, and alternatives, including an explanation of therapeutic medications proposed, their side effects, vs. discussion of surgical alternatives to medical treatment, and their risks, benefits and alternatives and expected outcomes.

## 2021-08-29 NOTE — Progress Notes
ATTESTATION    I personally performed the key portions of the E/M visit, discussed case with resident and concur with resident documentation of history, physical exam, assessment, and treatment plan unless otherwise noted.  Thomas Francis was seen with dr. 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  He reports that duloxetine has been helpful, but his mood is not at baseline.  He is very stressed related to social issues.  Dr. Su Hilt intends to increase duloxetine to 90 mg and decrease lorazepam, but make no other changes.  We will check on the psychotherapy referral for him.     Staff name:  Cecilie Lowers, MD Date: 08/29/2021

## 2021-08-29 NOTE — Progress Notes
Date of Service: 08/29/2021    Subjective:          Thomas Francis is a 51 y.o. male who presents today via telehealth for follow up.   Psychiatric history: MDD, trauma/stressor related disorder, BZD/opioid dependence, BPD   Medical history: HTN, RBBB, DM II, CKD, COPD, sleep apnea  Last appt: 07/04/2021 with Dr. Tamera Punt and Dr. Jacqulyn Bath. Medication changes included cross taper from citalopram to duloxetine.  Current psychotropic regimen:  ? Duloxetine 60mg  po qday, mood, trauma   ? Lorazepam 1mg  po bid prn severe anxiety   ? Zolpidem 5mg  po qhs prn sleep   ? Hydroxyzine 25mg  po qid prn mild-moderate anxiety   ? Bupropion XL 300mg  po qam, mood     History of Present Illness  Thomas Francis reports that he has been up and down since he last saw psychiatry. He indicates that his mood is off and on. He has completed medication change from citalopram to duloxetine. As he tapers from lorazepam, he has used hydroxyzine multiple times per day. He states that social situations can be particularly difficult as he is very empathetic and others have hurt him. He has struggled with some family stress. His father's health is declining. He doesn't feel worthy because he doesn't contribute to society. He is trying to make a commitment to a woman and is trying to date. He finds it difficult to fall asleep at night. He has been utilizing hydroxyzine and zolpidem to sleep at night. He has misused his zolpidem prescription and has been taking 10 mg instead of 5 mg. He sleeps 2-3 hours during the night and 5-6 hours during the day. He can't focus to watch television or movies. He has lost interest in many things. His energy level is described as flat. He reports some intended weight loss to 268 lbs with goal weight of 225 lbs. He notes intermittent nausea. He reports intermittent suicidal ideation and his most recent happened within the last month. He denies any current suicidal ideation, plan or intent. He has history of previous suicide attempts. He has opened up to others on Facebook in the past when he has been depressed. He denies any homicidal ideation, auditory hallucinations, or visual hallucinations.     Update to medical history: Patient denies any recent ER visits, hospitalizations, or new medical conditions.    Update to social history: He has an adult son and daughter. He has four grandsons. He raised his kids as a single father. He is dating right now. He has a dog named Duke that lives with him. He considers his dog as a good source of support. March is the anniversary of his nephew's suicide. He has associated guilt because his nephew had talked with him about some of his challenges. He was unable to talk with his nephew before he passed away.     Update to substance use history: Patient denies any tobacco, or recreational drug use. He reports that he occasionally has one beer.    Previous psychotropic medication trials/responses per chart review 07/04/2021:  - Bupropion XL up to 300mg  po qday, 02/2021-present   - Citalopram up to 40mg  po qday, 05/2017-present   - Depakote up to 500mg  po bid, 11/2018-02/2021  - Escitalopram up to 5mg  po qday. 06/2017-11/2017  - Gabapentin up to 800mg  po tid, 05/2017-present   - Hydroxyzine up to 25mg  po qid prn anxiety, 09/2018-present   - Lorazepam up to 1mg  po tid prn anxiety , 06/2017-present   -  Melatonin up to 10mg  po qhs, 05/2017-08/2020  - Trazodone up to 100mg  po qhs prn sleep and 25mg  po tid prn anxiety, 05/2017-11/2017  - Zolpidem up to 10mg  po qhs, 11/2017-present   - Patient also reports trials of alprazolam, fluvoxamine and melaril    Review of Systems   Constitutional: Negative for chills and fever.   HENT: Negative for rhinorrhea and sore throat.    Respiratory: Negative for cough and shortness of breath.    Cardiovascular: Negative for chest pain and palpitations.   Gastrointestinal: Positive for constipation. Negative for abdominal pain and diarrhea.   Genitourinary: Positive for difficulty urinating. Negative for dysuria.   Neurological: Positive for headaches (occasional). Negative for dizziness and numbness.     Objective:         ? albuterol sulfate (PROAIR HFA) 90 mcg/actuation HFA aerosol inhaler INHALE 2 PUFFS BY MOUTH FOUR TIMES DAILY AS NEEDED FOR SHORTNESS OF BREATH   ? alfuzosin (UROXATRAL) 10 mg tablet Take one tablet by mouth daily.   ? alprostadil 20 mcg/mL soln IC inj(cmpd) Inject five mcg into base of penis as directed as Needed. Max 1 dose/ 24-48 hrs.   ? aspirin (ASPIR-81 PO) Take 1 tablet by mouth every 24 hours.   ? aspirin EC 81 mg tablet Take one tablet by mouth daily. Take with food.   ? buPROPion XL (WELLBUTRIN XL) 300 mg tablet Take one tablet by mouth every morning. Do not crush or chew.   ? cholecalciferol (vitamin D3) (OPTIMAL D3) 50,000 units capsule TAKE ONE CAPSULE BY MOUTH WEEKLY ON FRIDAY   ? diclofenac (VOLTAREN) 1 % topical gel Apply four g topically to affected area four times daily. (Patient taking differently: Apply four g topically to affected area four times daily as needed.)   ? docusate (COLACE) 100 mg capsule Take one capsule by mouth twice daily.   ? duloxetine DR (CYMBALTA) 30 mg capsule Take three capsules by mouth every morning.   ? fluticasone propionate (FLONASE ALLERGY RELIEF) 50 mcg/actuation nasal spray, suspension Inhale one spray by mouth into the lungs daily.   ? fluticasone propionate (FLOVENT DISKUS) 100 mcg/actuation inhalation disk Inhale two puffs by mouth into the lungs twice daily.   ? FREESTYLE LIBRE 2 SENSOR sensor Use one each as directed before meals and at bedtime. Use as directed.  Indications: type 2 diabetes mellitus   ? furosemide (LASIX) 20 mg tablet Take one tablet by mouth as Needed. As needed once daily for peripheral edema   ? gabapentin (NEURONTIN) 800 mg tablet Take one tablet by mouth three times daily.   ? guaiFENesin LA (MUCINEX) 600 mg tablet Take one tablet by mouth twice daily.   ? hydrOXYzine HCL (ATARAX) 25 mg tablet Take one tablet by mouth four times daily as needed for Anxiety.   ? ipratropium bromide (ATROVENT) 0.02 % nebulizer solution Inhale 2.5 mL by mouth into the lungs four times daily as needed for Shortness of Breath or Wheezing.   ? JARDIANCE 25 mg tablet Take one tablet by mouth daily.   ? lactobacillus rhamnosus (GG) (CULTURELLE) 10 billion cell cap Take one capsule by mouth daily with breakfast.   ? LEVEMIR FLEXPEN 100 unit/mL (3 mL) injection pen Inject sixty Units under the skin at bedtime daily.   ? levocetirizine (XYZAL) 5 mg tablet Take one tablet by mouth daily.   ? levothyroxine (SYNTHROID) 50 mcg tablet Take one tablet by mouth daily 30 minutes before breakfast.   ? lisinopril (ZESTRIL) 2.5  mg tablet Take one tablet by mouth daily.   ? loperamide HCl (IMODIUM PO) Take 1 tablet by mouth daily as needed for Diarrhea.   ? [START ON 09/14/2021] LORazepam (ATIVAN) 1 mg tablet Take one-half tablet by mouth twice daily as needed for Other... (severe anxiety or panic attacks). Do not refill before 09/14/2021   ? meloxicam (MOBIC) 15 mg tablet Take one tablet by mouth daily.   ? metFORMIN-XR (GLUCOPHAGE XR) 500 mg extended release tablet Take two tablets by mouth twice daily with meals.   ? metoprolol succinate XL (TOPROL XL) 25 mg extended release tablet Take one tablet by mouth at bedtime daily.   ? Needle (Disp) 27 G 27 gauge x 1/2 ndle Use as directed.   ? ondansetron (ZOFRAN ODT) 4 mg rapid dissolve tablet as Needed.   ? OZEMPIC 1 mg/dose (4 mg/3 mL) injection PEN Inject one mg under the skin every 7 days.   ? phentermine (ADIPEX-P) 37.5 mg capsule Take one capsule by mouth daily.   ? syringe (disposable) 1 mL Use as directed.   ? tadalafiL (CIALIS) 5 mg tablet Take one tablet by mouth daily.   ? testosterone cypionate (DEPO-TESTOSTERONE) 200 mg/mL injection Inject 1 mL into the muscle every 14 days.   ? tiZANidine (ZANAFLEX) 4 mg tablet Take one tablet by mouth every 6 hours as needed.   ? vitamins, multiple tablet Take one tablet by mouth daily.   ? zolpidem (AMBIEN) 5 mg tablet TAKE ONE TABLET BY MOUTH AT BEDTIME AS NEEDED FOR SLEEP     Vitals:    08/29/21 1341   BP: (!) 143/85   BP Source: Arm, Right Upper   Pulse: 103   PainSc: Four   Weight: 127.5 kg (281 lb)   Height: 170.2 cm (5' 7)     Body mass index is 44.01 kg/m?Marland Kitchen     Physical Exam  Psychiatric:      Comments:   General/Constitutional: appears stated age, good hygiene, dressed in personal attire  Eye Contact: good   Behavior: calm and cooperative   Speech: regular rate, rhythm, and tone; good articulation; verbose,   Motor: No evidence of PMA or PMR  Mood: up and down  Affect: dysthymic and congruent   Thought Process: linear and goal directed  Thought Content: reports intermittent suicidal ideation but denies current SI, plan or intent, denies HI, shows no overt evidence of delusions or paranoia    Perception: denies AVH, shows no evidence of responding to internal stimuli  Associations: intact  Insight: fair  Judgement: good  Orientation: alert, grossly oriented, not formally assessed   Recent and remote memory: grossly intact  Attention span and concentration: engaged throughout interview  Cognition: average  Language: fluent in [employer] of knowledge/vocabulary: average   Gait: no ataxia, steady  Neuro: no UE tremors or tics   MSK: able to move all extremities spontaneously       Screenings:   08/29/21 1500   PHQ9 Questions and Score   Little interest or pleasure in doing things Almost all   Feeling down, depressed, or hopeless Almost all   Trouble falling or staying asleep, or sleeping too much Almost all   Feeling tired or having little energy Almost all   Poor appetite or overeating Over half   Feeling bad about yourself - or that you are a failure or have let yourself or your family down Almost all   Trouble concentrating on things, such as reading  the newspaper or watching television Almost all   Moving or speaking so slowly that other people could have noticed? Or the opposite - being so fidgety or restless that you have been moving around a lot more than usual. Over half   Thoughts that you would be better off dead or hurting yourself in some way Several days   Patient Health Questionnaire-9 Score (!) 23   If you checked off any problems on this questionnaire so far,   How difficult have these problems made it for you to do your work, take care of things at home, or get along with other people? Very difficult      08/29/21 1500   GAD-7   Feeling nervous, anxious or on edge 1   Not being able to stop or control worrying 3   Worrying too much about different things 3   Trouble relaxing 3   Being so restless that it is hard to sit still 2   Becoming easily annoyed or irritable 3   Feeling afraid as if something awful might happen 3   GAD-7 Total Score:   Total Score:  18   If you checked off any problems,   If you checked off any problems, how difficult have these problems made it for you to do your work, take care of things at home, or get along with other people? Very difficult      08/29/21 1500   PCL-5: In the past month, how much were you bothered by:   Repeated, disturbing, and unwanted memories of the stressful experience? 3   Repeated, disturbing dreams of the stressful experience? 2   Suddenly feeling or acting as if the stressful experience were actually happening again (as if you were actually back there reliving it)? 3   Feeling very upset when something reminded you of the stressful experience? 3   Having strong physical reactions when something reminded you of the stressful experience (for example, heart pounding, trouble breathing, sweating)? 2   Avoiding memories, thoughts, or feelings related to the stressful experience? 2   Avoiding external reminders of the stressful experience (for example, people, places, conversations, activities, objects, or situations)? 2   Trouble remembering important parts of the stressful experience? 2   Having strong negative beliefs about yourself, other people, or the world (for example, having thoughts such as: I am bad, there is something seriously wrong with me, no one can be trusted, the world is completely dangerous)? 4   Blaming yourself or someone else for the stressful experience or what happened after it? 3   Having strong negative feelings such as fear, horror, anger, guilt, or shame? 3   Loss of interest in activities that you used to enjoy? 3   Feeling distant or cut off from other people? 3   Trouble experiencing positive feelings (for example, being unable to feel happiness or have loving feelings for people close to you)? 3   Irritable behavior, angry outbursts, or acting aggressively? 1   Taking too many risks or doing things that could cause you harm? 1   Being superalert or watchful or on guard? 1   Feeling jumpy or easily startled? 0   Having difficulty concentrating? 3   Trouble falling or staying asleep? 3   Post-Traumatic Stress Disorder Total Score 47     Assessment and Plan:  KEEVON SLATER is a 51 y.o. male who presents today via telehealth for follow up.   Psychiatric history: MDD, trauma/stressor  related disorder, BZD/opioid dependence, BPD   Medical history: HTN, RBBB, DM II, CKD, COPD, sleep apnea  Last appt: 07/04/2021 with Dr. Tamera Punt and Dr. Jacqulyn Bath. Medication changes included cross taper from citalopram to duloxetine.  Current psychotropic regimen:  ? Duloxetine 60mg  po qday, mood, trauma   ? Lorazepam 1mg  po bid prn severe anxiety   ? Zolpidem 5mg  po qhs prn sleep   ? Hydroxyzine 25mg  po qid prn mild-moderate anxiety   ? Bupropion XL 300mg  po qam, mood   Thomas Francis reports that he has noticed feeling more calm and improvements in peripheral neuropathy. His pain has improved to the point that he is considering tapering gabapentin. He acknowledges that his depression and anxiety remain uncontrolled. Discussed with the patient regarding increase in duloxetine to target mood. He continues to have problems with sleep. However, his sleep schedule is very poor. He sleeps 2-3 hours per night and 5-6 hours during the day. Patient indicates that he is taking bupropion in the evening. Advised patient regarding importance of sleep hygiene and recommended taking bupropion and duloxetine in the morning to avoid excess stimulation. Patient requested increase in zolpidem. Would recommend against increase at this time given other factors related to sleep.     DSM-5 Diagnoses & Medical Conditions used in MDM:  1. Severe episode of recurrent major depressive disorder, without psychotic features (HCC)    2. Trauma and stressor-related disorder    3. Borderline personality disorder (HCC)    4. Type 2 diabetes mellitus with diabetic autonomic neuropathy, with long-term current use of insulin (HCC)       Psychotropic medications:  ? Increase duloxetine to 90 mg daily  ? Decrease lorazepam 0.5mg  BID PRN for severe anxiety (30 tablets for 30 days)  ? Continue bupropion XL 300 mg daily for mood. Take in the morning rather than the evening.  ? Continue Zolpidem 5mg  po qhs prn sleep   ? Continue Hydroxyzine 25mg  po qid prn mild-moderate anxiety    Labs:   ? None     Psychotherapy:  ? Psychologytoday.[email or web address] is a helpful online resource for finding a therapist   ? Referral for therapy with June Moore placed.     General policies:  ? Our clinic does not do long-term disability paperwork. On very rare circumstances we may do FMLA/short-term leave paperwork though we usually do not do this. Requests for filling out paperwork require an appointment for further discussion and review for paperwork to be filled out together.   ? Our clinic is dedicated to making sure your prescriptions are filled in a timely manner during regular business hours (8am-5pm).  If you need a medication refill, please call your pharmacy to request a refill.  Please make sure to request refills at least 72 hours in advance of your last dose.  Your pharmacy will reach out to [place] electronically, which is the preferred method.  Please try to refrain from paging the on-call psychiatrist regarding medication refills (including controlled substances), as you may not receive a refill or a full refill at that time.  ? For nonemergent concerns, you may reach out to the clinic via MyChart. You should receive a response within 72 business hours from our clinic staff/providers.    ? Communication via MyChart is appropriate for specific and straightforward concerns such as:   ? Clarification of medication instructions, questions about medications, reporting intolerable side effects    ? Refill requests    ? Review and discussion of lab results    ?  Specific items we have discussed that I have asked for you to reach out to me about    ? MyChart is in inappropriate modality for:   ? Significant medication changes not previously discussed (starting or stopping medications)   ? If you require an immediate response from a health care professional, please do not call the on-call psychiatrist and call 911 directly.    ? Early refills will not be provided for controlled substances (benzodiazepines, stimulants) and it is important that you keep these medications safe because they will  not be refilled early for any reason. Ongoing prescriptions for controlled substances require follow up visits that are maintained every 3 months.   ? [facility] has a zero tolerance policy for discrimination, mistreatment or abusive behavior, including verbal abuse.       Important contact information:   ? [facility] Phone: [phone]  ? Local Warm Line (not for crisis):               [facility]            phone: [phone] or [phone] (toll free)               [facility]                         4:00pm - 10:00pm every evening  ? Local Crisis Lines:               Name: [facility] [place] Crisis Line 24/7                phone: [phone]               Name: [facility] Mental Health 24/7                phone:    [phone]               Name: [facility] Mental Health Crisis Line 24/7            phone:    [phone]  ? National Suicide Prevention Lifelines & Textlines:                Dial or Text:       988                                               or chat: 988lifeline.org               or text:  [facility] - Text HOPE to 20121               Option for Hearing Impaired: Dial 711 then 988 - Text Telephone   ? Local emergency services:                [facility] - Emergency Department                     [address]               [place], [place] [address]  phone: [phone]  ? Or call 911     Patient seen and plan of care discussed with Dr. Jacqulyn Bath     Return to clinic in 3 months.

## 2021-09-01 ENCOUNTER — Encounter: Admit: 2021-09-01 | Discharge: 2021-09-01 | Payer: MEDICARE

## 2021-09-02 ENCOUNTER — Encounter: Admit: 2021-09-02 | Discharge: 2021-09-02 | Payer: MEDICARE

## 2021-09-02 DIAGNOSIS — J449 Chronic obstructive pulmonary disease, unspecified: Secondary | ICD-10-CM

## 2021-09-02 DIAGNOSIS — G4730 Sleep apnea, unspecified: Secondary | ICD-10-CM

## 2021-09-02 DIAGNOSIS — E039 Hypothyroidism, unspecified: Secondary | ICD-10-CM

## 2021-09-02 DIAGNOSIS — E669 Obesity, unspecified: Secondary | ICD-10-CM

## 2021-09-02 DIAGNOSIS — I10 Essential (primary) hypertension: Secondary | ICD-10-CM

## 2021-09-02 DIAGNOSIS — E119 Type 2 diabetes mellitus without complications: Secondary | ICD-10-CM

## 2021-09-02 DIAGNOSIS — J45909 Unspecified asthma, uncomplicated: Secondary | ICD-10-CM

## 2021-09-02 DIAGNOSIS — F99 Mental disorder, not otherwise specified: Secondary | ICD-10-CM

## 2021-09-02 DIAGNOSIS — G629 Polyneuropathy, unspecified: Secondary | ICD-10-CM

## 2021-09-02 DIAGNOSIS — G4733 Obstructive sleep apnea (adult) (pediatric): Secondary | ICD-10-CM

## 2021-09-02 DIAGNOSIS — R7989 Other specified abnormal findings of blood chemistry: Secondary | ICD-10-CM

## 2021-09-02 MED ORDER — ONDANSETRON HCL (PF) 4 MG/2 ML IJ SOLN
INTRAVENOUS | 0 refills | Status: DC
Start: 2021-09-02 — End: 2021-09-02
  Administered 2021-09-02: 16:00:00 4 mg via INTRAVENOUS

## 2021-09-02 MED ORDER — EPHEDRINE SULFATE 50 MG/ML IV SOLN
INTRAVENOUS | 0 refills | Status: DC
Start: 2021-09-02 — End: 2021-09-02
  Administered 2021-09-02 (×2): 20 mg via INTRAVENOUS
  Administered 2021-09-02: 17:00:00 10 mg via INTRAVENOUS

## 2021-09-02 MED ORDER — MIDAZOLAM 1 MG/ML IJ SOLN
INTRAVENOUS | 0 refills | Status: DC
Start: 2021-09-02 — End: 2021-09-02
  Administered 2021-09-02: 16:00:00 2 mg via INTRAVENOUS

## 2021-09-02 MED ORDER — PROPOFOL INJ 10 MG/ML IV VIAL
INTRAVENOUS | 0 refills | Status: DC
Start: 2021-09-02 — End: 2021-09-02
  Administered 2021-09-02: 16:00:00 200 mg via INTRAVENOUS

## 2021-09-02 MED ORDER — LIDOCAINE (PF) 200 MG/10 ML (2 %) IJ SYRG
INTRAVENOUS | 0 refills | Status: DC
Start: 2021-09-02 — End: 2021-09-02
  Administered 2021-09-02: 16:00:00 100 mg via INTRAVENOUS

## 2021-09-02 MED ORDER — FENTANYL CITRATE (PF) 50 MCG/ML IJ SOLN
INTRAVENOUS | 0 refills | Status: DC
Start: 2021-09-02 — End: 2021-09-02
  Administered 2021-09-02: 16:00:00 100 ug via INTRAVENOUS

## 2021-09-02 MED ORDER — DEXAMETHASONE SODIUM PHOSPHATE 4 MG/ML IJ SOLN
INTRAVENOUS | 0 refills | Status: DC
Start: 2021-09-02 — End: 2021-09-02
  Administered 2021-09-02: 16:00:00 4 mg via INTRAVENOUS

## 2021-09-02 MED ORDER — ARTIFICIAL TEARS (PF) SINGLE DOSE DROPS GROUP
OPHTHALMIC | 0 refills | Status: DC
Start: 2021-09-02 — End: 2021-09-02
  Administered 2021-09-02: 16:00:00 2 [drp] via OPHTHALMIC

## 2021-09-02 MED ORDER — PHENYLEPHRINE HCL IN 0.9% NACL 1 MG/10 ML (100 MCG/ML) IV SYRG
INTRAVENOUS | 0 refills | Status: DC
Start: 2021-09-02 — End: 2021-09-02
  Administered 2021-09-02 (×4): 200 ug via INTRAVENOUS

## 2021-09-02 MED ORDER — ROCURONIUM 10 MG/ML IV SOLN
INTRAVENOUS | 0 refills | Status: DC
Start: 2021-09-02 — End: 2021-09-02
  Administered 2021-09-02: 16:00:00 30 mg via INTRAVENOUS

## 2021-09-02 MED ORDER — SUGAMMADEX 100 MG/ML IV SOLN
INTRAVENOUS | 0 refills | Status: DC
Start: 2021-09-02 — End: 2021-09-02
  Administered 2021-09-02: 17:00:00 200 mg via INTRAVENOUS

## 2021-09-02 MED ADMIN — LEVOTHYROXINE 50 MCG PO TAB [4421]: 50 ug | ORAL | @ 20:00:00 | NDC 00904695061

## 2021-09-02 MED ADMIN — HYOSCYAMINE SULFATE 0.125 MG PO TBDI [29822]: 0.125 mg | SUBLINGUAL | @ 18:00:00 | NDC 47781001201

## 2021-09-02 MED ADMIN — FENTANYL CITRATE (PF) 50 MCG/ML IJ SOLN [3037]: 25 ug | INTRAVENOUS | @ 18:00:00 | Stop: 2021-09-02 | NDC 00409909412

## 2021-09-02 MED ADMIN — CEFTRIAXONE INJ 1GM IVP [210253]: 1 g | INTRAVENOUS | @ 16:00:00 | Stop: 2021-09-02 | NDC 00781320895

## 2021-09-02 MED ADMIN — DULOXETINE 30 MG PO CPDR [93424]: 90 mg | ORAL | @ 20:00:00 | NDC 57237001890

## 2021-09-02 MED ADMIN — GABAPENTIN 400 MG PO CAP [18307]: 800 mg | ORAL | @ 20:00:00 | NDC 00904666761

## 2021-09-02 MED ADMIN — LACTATED RINGERS IV SOLP [4318]: 1000 mL | INTRAVENOUS | @ 23:00:00 | Stop: 2021-09-02 | NDC 00338011704

## 2021-09-02 MED ADMIN — SODIUM CHLORIDE 0.9 % IV SOLP [27838]: 1000 mL | INTRAVENOUS | @ 18:00:00 | Stop: 2021-09-04 | NDC 00338004904

## 2021-09-02 MED ADMIN — FENTANYL CITRATE (PF) 50 MCG/ML IJ SOLN [3037]: 25 ug | INTRAVENOUS | @ 17:00:00 | Stop: 2021-09-02 | NDC 00409909412

## 2021-09-02 MED ADMIN — OXYBUTYNIN CHLORIDE 5 MG PO TAB [5938]: 5 mg | ORAL | @ 21:00:00 | NDC 00904702761

## 2021-09-02 MED ADMIN — DULOXETINE 60 MG PO CPDR [93425]: 90 mg | ORAL | @ 20:00:00 | NDC 00904704561

## 2021-09-02 MED ADMIN — CETIRIZINE 10 MG PO TAB [77730]: 10 mg | ORAL | @ 20:00:00 | NDC 00904671761

## 2021-09-02 MED ADMIN — LACTATED RINGERS IV SOLP [4318]: 1000.000 mL | INTRAVENOUS | @ 20:00:00 | Stop: 2021-09-02 | NDC 00338011704

## 2021-09-02 MED ADMIN — SODIUM CHLORIDE 0.9 % IV SOLP [27838]: 1000 mL | INTRAVENOUS | @ 16:00:00 | Stop: 2021-09-04 | NDC 00338004904

## 2021-09-02 MED ADMIN — OXYCODONE 5 MG PO TAB [10814]: 10 mg | ORAL | @ 18:00:00 | NDC 10702001801

## 2021-09-02 MED ADMIN — BUPROPION XL 300 MG PO TB24 [88618]: 300 mg | ORAL | @ 20:00:00 | NDC 00904657304

## 2021-09-02 NOTE — Anesthesia Post-Procedure Evaluation
Post-Anesthesia Evaluation    Name: Thomas Francis      MRN: [id number]     DOB: 1970-12-17     Age: 51 y.o.     Sex: male   __________________________________________________________________________     Procedure Information     Anesthesia Start Date/Time: 09/02/21 1102    Procedure: TRANSURETHRAL ELECTROSURGICAL RESECTION PROSTATE - COMPLETE (Urethra) - Bipolar, 1.5 hrs    Location: MAIN OR 27 / Main OR/Periop    Surgeons: Beather Arbour, MD          Post-Anesthesia Vitals  BP: 146/77 (07/18 1315)  Pulse: 93 (07/18 1315)  Respirations: 16 PER MINUTE (07/18 1315)  SpO2: 95 % (07/18 1315)  SpO2 Pulse: 94 (07/18 1315)  O2 Device: None (Room air) (07/18 1315)   Vitals Value Taken Time   BP 146/77 09/02/21 1315   Temp 36.7 C (98 F) 09/02/21 1201   Pulse 93 09/02/21 1315   Respirations 16 PER MINUTE 09/02/21 1315   SpO2 95 % 09/02/21 1315   O2 Device None (Room air) 09/02/21 1315   ABP     ART BP           Post Anesthesia Evaluation Note    Evaluation location: Pre/Post  Patient participation: recovered; patient participated in evaluation  Level of consciousness: alert    Pain score: 3  Pain management: adequate    Hydration: normovolemia  Temperature: 36.0C - 38.4C  Airway patency: adequate    Perioperative Events       Post-op nausea and vomiting: no PONV    Postoperative Status  Cardiovascular status: hemodynamically stable  Respiratory status: spontaneous ventilation  Follow-up needed: none        Perioperative Events  There were no known notable events for this encounter.

## 2021-09-02 NOTE — Anesthesia Pre-Procedure Evaluation
Anesthesia Pre-Procedure Evaluation    Name: Thomas Francis      MRN: [id number]     DOB: 1970/08/31     Age: 51 y.o.     Sex: male   __________________________________________________________________________     Procedure Date:    Procedure: Procedure(s) with comments:  TRANSURETHRAL ELECTROSURGICAL RESECTION PROSTATE - COMPLETE - Bipolar, 1.5 hrs     Physical Assessment  Vital Signs (last filed in past 24 hours):         Patient History  Allergies   Allergen Reactions   ? Vancomycin SEE COMMENTS     PT HAD AKI KIDNEY FAILURE FROM VANCOMYCIN   ? Ketamine SEE COMMENTS     STOPPED BREATHING WHEN GIVEN IV TOO FAST  STOPPED BREATHING WHEN GIVEN IV TOO FAST          Current Medications    Medication Directions   albuterol sulfate (PROAIR HFA) 90 mcg/actuation HFA aerosol inhaler INHALE 2 PUFFS BY MOUTH FOUR TIMES DAILY AS NEEDED FOR SHORTNESS OF BREATH   alfuzosin (UROXATRAL) 10 mg tablet Take one tablet by mouth daily.   alprostadil 20 mcg/mL soln IC inj(cmpd) Inject five mcg into base of penis as directed as Needed. Max 1 dose/ 24-48 hrs.   aspirin (ASPIR-81 PO) Take 1 tablet by mouth every 24 hours.   aspirin EC 81 mg tablet Take one tablet by mouth daily. Take with food.   buPROPion XL (WELLBUTRIN XL) 300 mg tablet Take one tablet by mouth every morning. Do not crush or chew.   cholecalciferol (vitamin D3) (OPTIMAL D3) 50,000 units capsule TAKE ONE CAPSULE BY MOUTH WEEKLY ON FRIDAY   diclofenac (VOLTAREN) 1 % topical gel Apply four g topically to affected area four times daily.  Patient taking differently: Apply four g topically to affected area four times daily as needed.   docusate (COLACE) 100 mg capsule Take one capsule by mouth twice daily.   duloxetine DR (CYMBALTA) 30 mg capsule Take three capsules by mouth every morning.   fluticasone propionate (FLONASE ALLERGY RELIEF) 50 mcg/actuation nasal spray, suspension Inhale one spray by mouth into the lungs daily.   fluticasone propionate (FLOVENT DISKUS) 100 mcg/actuation inhalation disk Inhale two puffs by mouth into the lungs twice daily.   FREESTYLE LIBRE 2 SENSOR sensor Use one each as directed before meals and at bedtime. Use as directed.  Indications: type 2 diabetes mellitus   furosemide (LASIX) 20 mg tablet Take one tablet by mouth as Needed. As needed once daily for peripheral edema   gabapentin (NEURONTIN) 800 mg tablet Take one tablet by mouth three times daily.   guaiFENesin LA (MUCINEX) 600 mg tablet Take one tablet by mouth twice daily.  Patient taking differently: Take one tablet by mouth twice daily as needed.   hydrOXYzine HCL (ATARAX) 25 mg tablet Take one tablet by mouth four times daily as needed for Anxiety.   ipratropium bromide (ATROVENT) 0.02 % nebulizer solution Inhale 2.5 mL by mouth into the lungs four times daily as needed for Shortness of Breath or Wheezing.   JARDIANCE 25 mg tablet Take one tablet by mouth daily.   lactobacillus rhamnosus (GG) (CULTURELLE) 10 billion cell cap Take one capsule by mouth daily with breakfast.   LEVEMIR FLEXPEN 100 unit/mL (3 mL) injection pen Inject sixty Units under the skin at bedtime daily.   levocetirizine (XYZAL) 5 mg tablet Take one tablet by mouth daily.   levothyroxine (SYNTHROID) 50 mcg tablet Take one tablet by mouth daily 30  minutes before breakfast.   lisinopril (ZESTRIL) 2.5 mg tablet Take one tablet by mouth daily.   loperamide HCl (IMODIUM PO) Take 1 tablet by mouth daily as needed for Diarrhea.   LORazepam (ATIVAN) 1 mg tablet Take one-half tablet by mouth twice daily as needed for Other... (severe anxiety or panic attacks). Do not refill before 09/14/2021   meloxicam (MOBIC) 15 mg tablet Take one tablet by mouth daily.   metFORMIN-XR (GLUCOPHAGE XR) 500 mg extended release tablet Take two tablets by mouth twice daily with meals.   metoprolol succinate XL (TOPROL XL) 25 mg extended release tablet Take one tablet by mouth at bedtime daily.   Needle (Disp) 27 G 27 gauge x 1/2 ndle Use as directed.   ondansetron (ZOFRAN ODT) 4 mg rapid dissolve tablet as Needed.   OZEMPIC 1 mg/dose (4 mg/3 mL) injection PEN Inject one mg under the skin every 7 days.   phentermine (ADIPEX-P) 37.5 mg capsule Take one capsule by mouth daily.   syringe (disposable) 1 mL Use as directed.   tadalafiL (CIALIS) 5 mg tablet Take one tablet by mouth daily.   testosterone cypionate (DEPO-TESTOSTERONE) 200 mg/mL injection Inject 1 mL into the muscle every 14 days.   tiZANidine (ZANAFLEX) 4 mg tablet Take one tablet by mouth every 6 hours as needed.   vitamins, multiple tablet Take one tablet by mouth daily.   zolpidem (AMBIEN) 5 mg tablet TAKE ONE TABLET BY MOUTH AT BEDTIME AS NEEDED FOR SLEEP     Medical History:   Diagnosis Date   ? Asthma    ? COPD (chronic obstructive pulmonary disease) (HCC)    ? DM (diabetes mellitus) (HCC)    ? Hypertension    ? Hypothyroid    ? Low testosterone    ? Morbid obesity with BMI of 50.0-59.9, adult (HCC) 06/17/2017   ? 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 Obesity    ? Obstructive sleep apnea    ? Sleep apnea      Surgical History:   Procedure Laterality Date   ? ABSCESS DRAINAGE Right 12/2016    [facility] right buttock cheek.   ? TRANSURETHRAL DRAINAGE ABSCESS - PROSTATE N/A 06/01/2017    Performed by Eveline Keto, MD at [facility] OR   ? ANGIOGRAPHY CORONARY ARTERY WITH LEFT HEART CATHETERIZATION N/A 07/14/2018    Performed by Myriam Jacobson, MD at [facility] CATH LAB   ? POSSIBLE PERCUTANEOUS CORONARY STENT PLACEMENT WITH ANGIOPLASTY N/A 07/14/2018    Performed by Myriam Jacobson, MD at [facility] CATH LAB   ? ESOPHAGOGASTRODUODENOSCOPY WITH SPECIMEN COLLECTION BY BRUSHING/ WASHING N/A 01/20/2019    Performed by Meyer Cory, MD at [facility] ENDO   ? ESOPHAGOGASTRODUODENOSCOPY WITH BIOPSY - FLEXIBLE N/A 01/20/2019    Performed by Meyer Cory, MD at [facility] ENDO   ? TONSILLECTOMY       Social History     Tobacco Use   ? Smoking status: Never   ? Smokeless tobacco: Former     Types: Chew     Quit date: 1990 Substance Use Topics   ? Alcohol use: Yes     Comment: social drinker   ? Drug use: Not Currently         Review of Systems/Medical History      Patient summary reviewed  Pertinent labs reviewed    PONV Screening: Non-smoker  No history of anesthetic complications (pt states she does not want ketamine. He did not like how  it made him feel)  No family history of anesthetic complications      Airway - negative        No TMJ      ETT 06/01/17; 1055; Ventilated by mask (1); Video laryngoscopy; Single-Lumen; 7.41mm; GlideScope; 4; Oral; 1-Full view of the glottis; 1 insertion attempt; Auscultation, ETCO2 Detector; 25 centimeters; 06/01/17; 1302       Pulmonary          Asthma ( daily advair, flovent BID, albuterol 2-3 times a day)    COPD (daily advair, flovent BID, albuterol 2-3 times a day)      No indications/hx of pneumonia      No recent URI      PFTs 09/2018  FVC 80%  FEV1 77%  FEV1/FVC 77%      Cardiovascular       Recent diagnostic studies:          echocardiogram and stress test          Echocardiogram 06/2018  Interpretation Summary  1.Normal LV size, wall motion with EF 55%.   2.Normal LV diastolic function  3.Normal RV size and function.   4.Suboptimal visualization of the valve, however no significant valvular abnormality on color Doppler.  5.Normal CVP & PA systolic pressure 33 mmHg.      Stress 06/2018  SUMMARY/OPINION:??This study is technically difficult given patient's extreme body habitus.  However it does appear abnormal. There is a small sized mild intensity reversible perfusion abnormality seen in the inferior and inferolateral segments from the base to the mid ventricular cavity.  This is consistent with ischemia in the right coronary artery distribution.  The global left ventricular systolic function is mild to moderately depressed with an LVEF of 42%. There are no high risk prognostic indicators present.  The pharmacologic ECG portion of the study is negative for ischemia.  Comparison is made with a prior thallium myocardial perfusion imaging study completed 06/19/2017.  Ejection fraction was 66 %.  The study was normal and did not reveal any perfusion abnormalities.  The small inferior and inferolateral reversible perfusion defect is new and there is been a decrease in the calculated LVEF.  This study is intermediate risk predominantly due to reduced left ventricular systolic function with an LVEF of 42%.  The amount of inducible ischemia appears to be small.  In aggregate the current study is intermediate risk in regards to predicted annual cardiovascular mortality rate.       Exercise tolerance: <4 METS (pt unable to walk 4 blocks without SOB and some chest tightness, related to weight and lung disease)       Beta Blocker therapy: Yes      Hypertension, well controlled    No valvular problems/murmurs            No palpitations      Dysrhythmias ( 15 beat run of [facility] 06/02/17, asymptomatic; RBBB)    Angina ( stable chest tightness with exertion, evaluated by cardiology) with exertion      Hyperlipidemia ( on statin)      No orthopnea      Dyspnea on exertion      No syncope      Daily ASA    Followed by cardiology, last OV 08/11/18, recommended follow up in 6 months    Per Roberts Gaudy, PA-C (CVM)- We discussed bariatric surgery in the near future to assist with his weight loss management and should be cleared from a CV standpoint given his  normal coronary angiography and normal echo Doppler.    Cath 06/2018  IMPRESSION:  Normal coronary arteries with no evidence of any significant atherosclerosis.      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       Constitution - negative   Physical Exam    Airway Findings      Mallampati: II      TM distance: <3 FB      Neck ROM: full      Mouth opening: good    Dental Findings: Negative            Cardiovascular Findings:       Rhythm: regular      Rate: normal      No murmur, no carotid bruit, no peripheral edema    Pulmonary Findings:       Breath sounds clear to auscultation.    Abdominal Findings:       Obese    Neurological Findings:       Alert and oriented x 3    Normal mental status    Constitutional findings: Negative      No acute distress       Diagnostic Tests  Hematology:   Lab Results   Component Value Date    HGB 17.5 05/09/2021    HCT 53.1 05/09/2021    PLTCT 206 05/09/2021    WBC 8.4 05/09/2021    NEUT 50 06/29/2017    ANC 2.90 06/29/2017    ALC 2.00 06/29/2017    MONA 12 06/29/2017    AMC 0.70 06/29/2017    EOSA 2 06/29/2017    ABC 0.10 06/29/2017    MCV 89.1 05/09/2021    MCH 29.4 05/09/2021    MCHC 33 05/09/2021    MPV 10.2 05/09/2021    RDW 43.5 05/09/2021         General Chemistry:   Lab Results   Component Value Date    NA 137 05/09/2021    K 4.0 05/09/2021    CL 102 05/09/2021    CO2 23  05/09/2021    GAP 12 05/09/2021    BUN 14 05/09/2021    CR 1.07 05/09/2021    GLU 82 05/09/2021    CA 9.1 05/09/2021    ALBUMIN 3.9 05/09/2021    LACTIC 1.5 05/28/2017    MG 1.7 07/12/2018    TOTBILI 0.51 05/09/2021    PO4 5.3 06/24/2017      Coagulation:   Lab Results   Component Value Date    PTT 27.6 06/17/2017    INR 1.1 06/17/2017         Anesthesia Plan    ASA score: 3   Plan: general  Induction method: intravenous  NPO status: acceptable      Informed Consent  Anesthetic plan and risks discussed with patient.  Use of blood products discussed with patient      Plan discussed with: anesthesiologist and CRNA.       PAC Plan      Alerts

## 2021-09-03 ENCOUNTER — Encounter: Admit: 2021-09-03 | Discharge: 2021-09-03 | Payer: MEDICARE

## 2021-09-03 MED ORDER — DULOXETINE 60 MG PO CPDR
ORAL_CAPSULE | 0 refills
Start: 2021-09-03 — End: ?

## 2021-09-03 MED ADMIN — SENNOSIDES-DOCUSATE SODIUM 8.6-50 MG PO TAB [40926]: 1 | ORAL | @ 14:00:00 | Stop: 2021-09-03 | NDC 00536124801

## 2021-09-03 MED ADMIN — OXYCODONE 5 MG PO TAB [10814]: 10 mg | ORAL | @ 15:00:00 | Stop: 2021-09-03 | NDC 00406055223

## 2021-09-03 MED ADMIN — METOPROLOL SUCCINATE 25 MG PO TB24 [81866]: 25 mg | ORAL | @ 01:00:00 | NDC 00904632261

## 2021-09-03 MED ADMIN — OXYCODONE 5 MG PO TAB [10814]: 10 mg | ORAL | @ 01:00:00 | NDC 42858000110

## 2021-09-03 MED ADMIN — DULOXETINE 60 MG PO CPDR [93425]: 90 mg | ORAL | @ 14:00:00 | Stop: 2021-09-03 | NDC 00904704561

## 2021-09-03 MED ADMIN — BUPROPION XL 300 MG PO TB24 [88618]: 300 mg | ORAL | @ 14:00:00 | Stop: 2021-09-03 | NDC 00904657304

## 2021-09-03 MED ADMIN — HYDROXYZINE HCL 25 MG PO TAB [3774]: 25 mg | ORAL | @ 03:00:00 | NDC 00904661761

## 2021-09-03 MED ADMIN — CETIRIZINE 10 MG PO TAB [77730]: 10 mg | ORAL | @ 14:00:00 | Stop: 2021-09-03 | NDC 00904671761

## 2021-09-03 MED ADMIN — LACTATED RINGERS IV SOLP [4318]: 1000 mL | INTRAVENOUS | @ 13:00:00 | Stop: 2021-09-03 | NDC 00338011704

## 2021-09-03 MED ADMIN — DULOXETINE 30 MG PO CPDR [93424]: 90 mg | ORAL | @ 14:00:00 | Stop: 2021-09-03 | NDC 57237001890

## 2021-09-03 MED ADMIN — ZOLPIDEM 5 MG PO TAB [81803]: 5 mg | ORAL | @ 03:00:00 | NDC 00904608261

## 2021-09-03 MED ADMIN — GABAPENTIN 400 MG PO CAP [18307]: 800 mg | ORAL | @ 14:00:00 | Stop: 2021-09-03 | NDC 00904666761

## 2021-09-03 MED ADMIN — LEVOTHYROXINE 50 MCG PO TAB [4421]: 50 ug | ORAL | @ 11:00:00 | Stop: 2021-09-03 | NDC 00904695061

## 2021-09-03 MED ADMIN — GABAPENTIN 400 MG PO CAP [18307]: 800 mg | ORAL | @ 01:00:00 | NDC 00904666761

## 2021-09-03 MED FILL — SENNOSIDES-DOCUSATE SODIUM 8.6-50 MG PO TAB: ORAL | 30 days supply | Qty: 60 | Fill #1 | Status: CP

## 2021-09-03 MED FILL — PHENAZOPYRIDINE 200 MG PO TAB: 200 mg | ORAL | 2 days supply | Qty: 6 | Fill #1 | Status: CP

## 2021-09-04 ENCOUNTER — Encounter: Admit: 2021-09-04 | Discharge: 2021-09-04 | Payer: MEDICARE

## 2021-09-04 DIAGNOSIS — E119 Type 2 diabetes mellitus without complications: Secondary | ICD-10-CM

## 2021-09-04 DIAGNOSIS — E669 Obesity, unspecified: Secondary | ICD-10-CM

## 2021-09-04 DIAGNOSIS — I10 Essential (primary) hypertension: Secondary | ICD-10-CM

## 2021-09-04 DIAGNOSIS — F99 Mental disorder, not otherwise specified: Secondary | ICD-10-CM

## 2021-09-04 DIAGNOSIS — R7989 Other specified abnormal findings of blood chemistry: Secondary | ICD-10-CM

## 2021-09-04 DIAGNOSIS — G4730 Sleep apnea, unspecified: Secondary | ICD-10-CM

## 2021-09-04 DIAGNOSIS — E039 Hypothyroidism, unspecified: Secondary | ICD-10-CM

## 2021-09-04 DIAGNOSIS — J449 Chronic obstructive pulmonary disease, unspecified: Secondary | ICD-10-CM

## 2021-09-04 DIAGNOSIS — G629 Polyneuropathy, unspecified: Secondary | ICD-10-CM

## 2021-09-04 DIAGNOSIS — J45909 Unspecified asthma, uncomplicated: Secondary | ICD-10-CM

## 2021-09-04 DIAGNOSIS — G4733 Obstructive sleep apnea (adult) (pediatric): Secondary | ICD-10-CM
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
## 2021-09-09 ENCOUNTER — Encounter: Admit: 2021-09-09 | Discharge: 2021-09-09 | Payer: MEDICARE
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
## 2021-09-13 ENCOUNTER — Encounter: Admit: 2021-09-13 | Discharge: 2021-09-13 | Payer: MEDICARE

## 2021-09-13 MED ORDER — DULOXETINE 60 MG PO CPDR
ORAL_CAPSULE | 0 refills
Start: 2021-09-13 — End: ?

## 2021-09-25 ENCOUNTER — Encounter: Admit: 2021-09-25 | Discharge: 2021-09-25 | Payer: MEDICARE

## 2021-09-25 NOTE — Telephone Encounter
09/25/2021 9:27 AM     rec'd below CC request from [facility] (Dr Para March) for R arthroscopy + rotator suff repair scheduled 10/24/21.   Per chart review, pt had L rotator cuff repaired on 07/20/21 & recently saw JO on 07/30/21.     Routed to [facility] for updated CC letter.

## 2021-09-29 ENCOUNTER — Encounter: Admit: 2021-09-29 | Discharge: 2021-09-29 | Payer: MEDICARE

## 2021-10-03 ENCOUNTER — Encounter: Admit: 2021-10-03 | Discharge: 2021-10-03 | Payer: MEDICARE

## 2021-10-03 DIAGNOSIS — N529 Male erectile dysfunction, unspecified: Secondary | ICD-10-CM

## 2021-10-03 DIAGNOSIS — N401 Enlarged prostate with lower urinary tract symptoms: Secondary | ICD-10-CM

## 2021-10-03 DIAGNOSIS — N486 Induration penis plastica: Secondary | ICD-10-CM

## 2021-10-03 NOTE — Progress Notes
CC: Thomas Francis [id number]), is a 51 y.o. male referred for consultation by No ref. provider found because of: LUTS/ED on 07/11/2021.    PCP:  Thomas Francis    Subjective    HPI:  The patient is a 51 y.o. 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  Was on flomax but self stopped 2/2 deceased ejaculations.  Noted his urination has gotten worse since this time.  No hematuria and dysuria.    Has tried c5, v100 on top, ICI alprostadil, VED, nothing has given him adequate penetration quality erections that last.  Notes w/ ICI had distal tapering but even base was not adequate for erections.      ----------------------------------------------------------------------------------------------------------------------------------  08/29/2021: Returned today for cystoscopy see note.  No changes in symptoms still w/ hesitancy.      ----------------------------------------------------------------------------------------------------------------------------------  10/03/2021: s/p Transurethral Electrosurgical Resection Prostate - Complete on 09/02/2021 here for follow up.  Has some slight discomfort when urinating and occasional discoloration however his sx are much better compared to previous.  Down from 407 to 262lbs.  Still w/ erectile dysfunction.             Medical / Surgical / Social / Family History Reviewed:   has a past medical history of Asthma, COPD (chronic obstructive pulmonary disease) (HCC), DM (diabetes mellitus) (HCC), Hypertension, Hypothyroid, Low testosterone, Morbid obesity with BMI of 50.0-59.9, adult (HCC) (06/17/2017), Neuropathy (2010), Obesity, Obstructive sleep apnea, Psychiatric illness, and Sleep apnea.   has a past surgical history that includes tonsillectomy; Abscess drainage (N/A, 06/01/2017); Abscess drainage (Right, 12/2016); Upper gastrointestinal endoscopy (N/A, 01/20/2019); Upper gastrointestinal endoscopy (N/A, 01/20/2019); rotator cuff repair (Left, 07/2021); and Prostate surgery (N/A, 09/02/2021).   reports that he has never smoked. He quit smokeless tobacco use about 33 years ago.  His smokeless tobacco use included chew. He reports current alcohol use. He reports that he does not currently use drugs.   family history includes Heart Disease in his father; None Reported in his mother.    Allergies / Medications Reviewed:  Allergies   Allergen Reactions   ? Vancomycin SEE COMMENTS     PT HAD AKI KIDNEY FAILURE FROM VANCOMYCIN   ? Ketamine SEE COMMENTS     STOPPED BREATHING WHEN GIVEN IV TOO FAST  STOPPED BREATHING WHEN GIVEN IV TOO FAST         Current Outpatient Medications:   ?  acetaminophen (ACETAMINOPHEN EXTRA STRENGTH) 500 mg tablet, Take two tablets by mouth every 6 hours as needed for Pain. Max of 4,000 mg of acetaminophen in 24 hours., Disp: , Rfl: 0  ?  albuterol sulfate (PROAIR HFA) 90 mcg/actuation HFA aerosol inhaler, INHALE 2 PUFFS BY MOUTH FOUR TIMES DAILY AS NEEDED FOR SHORTNESS OF BREATH, Disp: , Rfl:   ?  alfuzosin (UROXATRAL) 10 mg tablet, Take one tablet by mouth daily., Disp: 90 tablet, Rfl: 3  ?  alprostadil 20 mcg/mL soln IC inj(cmpd), Inject five mcg into base of penis as directed as Needed. Max 1 dose/ 24-48 hrs., Disp: 10 mL, Rfl: 11  ?  aspirin EC 81 mg tablet, Take one tablet by mouth daily. Take with food., Disp: , Rfl:   ?  buPROPion XL (WELLBUTRIN XL) 300 mg tablet, Take one tablet by mouth every morning. Do not crush or chew., Disp: 30 tablet,  Rfl: 3  ?  cholecalciferol (vitamin D3) (OPTIMAL D3) 50,000 units capsule, TAKE ONE CAPSULE BY MOUTH WEEKLY ON FRIDAY, Disp: , Rfl:   ?  diclofenac (VOLTAREN) 1 % topical gel, Apply four g topically to affected area four times daily. (Patient taking differently: Apply four g topically to affected area four times daily as needed.), Disp: 300 g, Rfl: 3  ? docusate (COLACE) 100 mg capsule, Take one capsule by mouth twice daily., Disp: , Rfl:   ?  duloxetine DR (CYMBALTA) 30 mg capsule, Take three capsules by mouth every morning., Disp: 90 capsule, Rfl: 3  ?  fluticasone propionate (FLONASE ALLERGY RELIEF) 50 mcg/actuation nasal spray, suspension, Inhale one spray by mouth into the lungs daily., Disp: , Rfl:   ?  fluticasone propionate (FLOVENT DISKUS) 100 mcg/actuation inhalation disk, Inhale two puffs by mouth into the lungs twice daily., Disp: , Rfl:   ?  FREESTYLE LIBRE 2 SENSOR sensor, Use one each as directed before meals and at bedtime. Use as directed.  Indications: type 2 diabetes mellitus, Disp: 6 each, Rfl: 3  ?  furosemide (LASIX) 20 mg tablet, Take one tablet by mouth as Needed. As needed once daily for peripheral edema, Disp: 90 tablet, Rfl: 0  ?  gabapentin (NEURONTIN) 800 mg tablet, Take one tablet by mouth three times daily., Disp: , Rfl:   ?  guaiFENesin LA (MUCINEX) 600 mg tablet, Take one tablet by mouth twice daily. (Patient taking differently: Take one tablet by mouth twice daily as needed.), Disp: 180 tablet, Rfl: 0  ?  hydrOXYzine HCL (ATARAX) 25 mg tablet, Take one tablet by mouth four times daily as needed for Anxiety., Disp: 120 tablet, Rfl: 3  ?  hyoscyamine sulfate (LEVSIN/SL) 0.125 mg sublingual tablet, Place one tablet under tongue every 4 hours as needed for Cramps. Bladder spasms. Max 1.5 mg/ day, Disp: 30 tablet, Rfl: 0  ?  ipratropium bromide (ATROVENT) 0.02 % nebulizer solution, Inhale 2.5 mL by mouth into the lungs four times daily as needed for Shortness of Breath or Wheezing., Disp: , Rfl:   ?  JARDIANCE 25 mg tablet, Take one tablet by mouth daily., Disp: , Rfl:   ?  lactobacillus rhamnosus (GG) (CULTURELLE) 10 billion cell cap, Take one capsule by mouth daily with breakfast., Disp: 90 capsule, Rfl: 0  ?  LEVEMIR FLEXPEN 100 unit/mL (3 mL) injection pen, Inject sixty Units under the skin at bedtime daily., Disp: , Rfl:   ? levocetirizine (XYZAL) 5 mg tablet, Take one tablet by mouth daily., Disp: , Rfl:   ?  levothyroxine (SYNTHROID) 50 mcg tablet, Take one tablet by mouth daily 30 minutes before breakfast., Disp: , Rfl:   ?  lisinopriL (ZESTRIL) 2.5 mg tablet, Hold this medication on discharge. Restart 09/10/21 with directions to take 1 tablet once daily., Disp: 30 tablet, Rfl: 0  ?  loperamide HCl (IMODIUM PO), Take 1 tablet by mouth daily as needed for Diarrhea., Disp: , Rfl:   ?  LORazepam (ATIVAN) 1 mg tablet, Take one-half tablet by mouth twice daily as needed for Other... (severe anxiety or panic attacks). Do not refill before 09/14/2021, Disp: 30 tablet, Rfl: 2  ?  meloxicam (MOBIC) 15 mg tablet, Hold this medication on discharge. Restart 09/10/21 with directions to take 1 tablet once daily., Disp: , Rfl:   ?  metFORMIN-XR (GLUCOPHAGE XR) 500 mg extended release tablet, Take two tablets by mouth twice daily with meals., Disp: , Rfl:   ?  metoprolol  succinate XL (TOPROL XL) 25 mg extended release tablet, Take one tablet by mouth at bedtime daily., Disp: 90 tablet, Rfl: 1  ?  Needle (Disp) 27 G 27 gauge x 1/2 ndle, Use as directed., Disp: 20 each, Rfl: 11  ?  ondansetron (ZOFRAN ODT) 4 mg rapid dissolve tablet, as Needed., Disp: , Rfl:   ?  OZEMPIC 1 mg/dose (4 mg/3 mL) injection PEN, Inject one mg under the skin every 7 days., Disp: , Rfl:   ?  phenazopyridine (PYRIDIUM) 200 mg tablet, Take one tablet by mouth three times daily as needed for Pain. Max 3 days., Disp: 6 tablet, Rfl: 0  ?  phentermine (ADIPEX-P) 37.5 mg capsule, Take one capsule by mouth daily., Disp: , Rfl:   ?  sennosides-docusate sodium (SENOKOT-S) 8.6/50 mg tablet, Take one tablet by mouth twice daily., Disp: 90 tablet, Rfl: 0  ?  syringe (disposable) 1 mL, Use as directed., Disp: 12 each, Rfl: 11  ?  tadalafiL (CIALIS) 5 mg tablet, Take one tablet by mouth daily., Disp: 30 tablet, Rfl: 11  ?  testosterone cypionate (DEPO-TESTOSTERONE) 200 mg/mL injection, Inject 1 mL into the muscle every 14 days., Disp: , Rfl:   ?  tiZANidine (ZANAFLEX) 4 mg tablet, Take one tablet by mouth every 6 hours as needed., Disp: 15 tablet, Rfl: 3  ?  vitamins, multiple tablet, Take one tablet by mouth daily., Disp: , Rfl:   ?  zolpidem (AMBIEN) 5 mg tablet, TAKE ONE TABLET BY MOUTH AT BEDTIME AS NEEDED FOR SLEEP, Disp: 30 tablet, Rfl: 0     Review of systems (X and or explanation = positive, blank box = negative):  General Urologic   Fevers  Hematuria    Unintentional weight loss  Dysuria    Fatigue x Urinary frequency x   Blindness  Urinary urgency    Blurred vision  Urinary incontinence    History of a heart attack  Incomplete bladder emptying x   Heart murmur  Nocturia x   Bone pain  Weak stream x   Fractures  Straining x   Focal weakness  Abdominal pain    Stroke  Bladder pain    Melanoma  Kidney pain x   Basal cell carcinoma  Scrotal Pain X (mild compared to other pain he has - lots of neuropathy)   Diabetes x History of urologic cancer    Thyroid Disease x History of urologic surgery    Clotting Problem  History of hernia / hernia repair    Bleeding problem  History of urologic trauma    Pulmonary embolus  History of kidney stones    Congestive heart failure  History of urinary tract infections    Rectal bleeding  History of sexually transmitted diseases    Reflux  Family history of bleeding/clotting problems      Lifestyle Data:  Domain  Score Notes   STOP BANG  OSA if > 2 consider discussing w/PCP for sleep evaluation   PHQ-2  2 if > 2 consider discussing w/ PCP for formal depression screen   Diet Score  Q1: 0-1=3, 2-3=2, 4-5=1, 5-6=0 Q2: 0-1=0, 2-3=1, 4-5=2, 5-6=3 2 if > 2 consider discussing offer diet recommendations   Activity Level  Average minutes of activity per representative week Light 180  Moderate 60  Vigorous 90  If < 150 moderate, <75 vigorous, or patient interested offer exercise recommendations     He is able to walk 4 blocks or 2 flights of stairs without  chest pain or significant shortness of breath.       Objective     Physical exam:  TH today for review  Vitals: There were no vitals taken for this visit.  General appearance: Well-appearing, healthy male in no apparent distress  Skin: Normal appearance, no excessive sebum production noted.  Mental Status: Alert and oriented, normal affect.  Neuro:  Grossly intact  HEENT: Normocephalic, atraumatic.  Neck: Normal.    Chest: Non tender, no gynecomastia  Abdomen: Soft, non-tender, no masses or organomegaly. Bladder is not palpable. No costovertebral tenderness. No evidence of inguinal hernia.  Extremities: No peripheral edema    Penis: Penis is circumcised with + 2x1cm distal dorsal plaque, secondarily buried even with stretch + pre-pubic fat pad. Urethral meatus is normal.  Scrotum: Scrotum is grossly normal.  Testes: Descended bilaterally no evidence of masses or tenderness.      Data review:  Labs:  Testosterone:  No results found for: TESTOSTER, TESTERTOTLCM     Urine Dip:  Lab Results   Component Value Date    URBLOODPOC Negative 07/11/2021    URGLUCPOC 2+ (A) 07/11/2021    URLEUKPOC Negative 07/11/2021    URNITRIPOC Negative 07/11/2021    URPROTPOC Negative 07/11/2021    UROBILPOC 0.2 07/11/2021    UBLD NEG 09/02/2021     Urine Micro:  Lab Results   Component Value Date    UWBC 20-50 09/02/2021    URBC 2-10 09/02/2021    URBC 2-10 08/29/2021    URBC 0-2 07/17/2020    UMUC TRACE 09/02/2021    UBACT FEW (A) 08/29/2021    USQUCELLS 0-2 09/02/2021       No results found for: Lebron Quam, URINECULT   PSA:  Lab Results   Component Value Date    PSA 0.15 06/25/2017     Creatinine:  Lab Results   Component Value Date    CR 1.50 (H) 09/03/2021    CR 1.07 05/09/2021    CR 1.09 04/24/2021       Hematocrit:  Lab Results   Component Value Date    HCT 46.5 09/03/2021    HCT 53.1 05/09/2021    HCT 55.5 (H) 04/24/2021     STI panel:  Lab Results   Component Value Date    NGONORPCR NEG 05/28/2017    CHLTRACPCR NEG 05/28/2017 Hemoglobin A1c:  Lab Results   Component Value Date    HGBA1C 5.4 04/24/2021    HGBA1C 8.8 (H) 06/25/2017    HGBA1C 11.3 (H) 05/30/2017       UDS 2023:  UROFLOW:  Qmax (mL/ s): 14.3  Qavg (mL/ s):  6.8  Voided volume (mL): 139  PVR (mL):  15  Curve: intermittent, prolonged    FILL:  First sensation (mL): 495  First desire (mL): 710  Strong desire (mL): 847  Capacity: 879   DO: mild    VOID:  Qmax (ml/s): 29  Qavg (mL/ s):  10  Pdet @ Qmax (cm H20): 110  Pdetmax (cm H20): 185  Voided volume (mL): 819  Pvr (mL): 61    EMG: synnergic      ASSESSMENT:  1.  High pressure, low flow voiding pattern with preserved detrusor funciton; large capacity hyposensate bladder    Imaging Studies:  No results found for this or any previous visit from the past 365 days.      Pathology:  Lab Results   Component Value Date    CPRPT  09/02/2021  [facility]  [email or web address]    Department of Pathology and Laboratory Medicine  [address] [place], [place] [address]  Surgical Pathology Office:  [phone]  Fax:  [phone]  SURGICAL PATHOLOGY REPORT    NAME: Thomas Francis, Thomas Francis PATH #: [id number] MR #: [id number] SPECIMEN  CLASS: SR BILLING #: [id number] ALT ID #:  LOCATION: [facility] DATE OF  PROCEDURE: 09/02/2021 AGE:  51 SEX: M DATE RECEIVED: 09/02/2021 DOB:  [birth year]  TIME RECEIVED:  12:13 PHYSICIAN: Beather Arbour, MD DATE OF  REPORT: 09/08/2021 COPY TO:  DATE OF PRINTING: 09/08/2021         ########################################################################  Final Diagnosis:    A. Prostate, prostate chips, transurethral resection  Benign prostate tissue with predominantly stromal hyperplasia.   Prostatic urethra with the acute and chronic inflammation and  reactive changes.    B. Prostate, urethral tumor, transurethral resection:   Benign prostate tissue with glandular and stromal hyperplasia.           Attestation:  By this signature, I attest that I have personally formulated the final  interpretation expressed in this report and that the above diagnosis is  based upon my examination of the slides and/or other material indicated in  this report.    +++Electronically Signed Out By Tomie China, MD on 09/08/2021+++             ksw/09/02/2021             ########################################################################  Material Received:  A: prostate chips  B: urethral tumor    History:  The patient has a history of benign prostatic hyperplasia with urinary  hesitancy, urethral lesion.        Gross Description:  A. Fixative: Formalin  Labeled: Prostate chips  Weight: 1 g  Dimensions: 1.5 x 1.3 x 0.4 cm   A1 entire specimen (sa)    B. Fixative: Formalin  Labeled: Urethral tumor  Weight: 1 g  Dimensions: 2.7 x 2.5 x 0.5 cm   B1 entire specimen (sa)    sa/09/02/2021                      Medical Decision Making:    1. We discussed PDE-5 inhibitors including daily and on demand dosing.  We discussed adeverse effects including but not limited to: head ache, muscle ache, GI upset, changes in vision, light headedness and facial flushing.  2. We discussed MUSE (intraurethral alprostadil suppository), which would require an initial teaching session in clinic. It is only effective in 30-40% of patients, and is not as effective as intracavernosal injections. Most common side effects include dysuria or pain with insertion of the suppository.  3. We discussed ICI, which is typically the most effective medical treatment options. At 1 year on therapy, 60-70% of patients are still using ICI. Patients who discontinue therapy usually do so due to lack of efficacy, reliability, or pain with injections. This would require injection teaching session(s) for optimal technique and dosing. If the patient is on anticoagulation, he would be at increased risk for hematoma formation at the site of injection.  4. We discussed VED (vacuum erection device) which usually results in the least natural erection and most cumbersome approach of all therapies and that men on anticoagulation should avoid this approach as the risk for hematoma is high.  5. We discussed penile implant insertion, which is the most effective but most invasive of all the options listed above. We discussed the post  operative course briefly and if he desires this option will need a separate visit dedicated to this including recent a1c.          ASSESSMENT:   51 y.o. 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  Was on flomax but self stopped 2/2 deceased ejaculations.  Noted his urination has gotten worse since this time.  No hematuria and dysuria.    Has tried c5, v100 on top, ICI alprostadil, VED, nothing has given him adequate penetration quality erections that last.  Notes w/ ICI had distal tapering but even base was not adequate for erections    1. 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 Peyronie's disease with distal tapering only, no curve, unclear when/if active  3. LUTS combined storage and voiding sx w/ UDS showing obstruction unclear gland size, had hx of TURP 2019 for abscess of 17g, some success w/ flomax but self dc'd bc of lack of ejaculation, found to have bladder neck lesion on cysto, now  s/p Transurethral Electrosurgical Resection Prostate - Complete on 09/02/2021 here for follow up.   4. Aspermia 2/2 meds and/or DM      PLAN:  Penile stretching and VED  Will have repeat ici teaching 2 months --> if fails then ipp pending a1c staying stable pending Peyronie's disease impact on erections  Continue lifestyle modification        RETURN TO CLINIC in 3 months or sooner if warranted, symptoms change, or concern arises regarding the aforementioned or new symptoms.    Billing:  Level of Medical Decision Making:    Number of Problems Addressed: 1 post op 2 not related    Complexity of problems addressed:   minimal     low   x moderate     high     Amount and/or Complexity of Data Reviewed and Analyzed:   minimal     limited   x moderate     extensive     Risk of Complication and/or Morbidity or Mortality of Patient Management   minimal     low   x moderate     high

## 2021-10-07 ENCOUNTER — Encounter: Admit: 2021-10-07 | Discharge: 2021-10-07 | Payer: MEDICARE

## 2021-10-07 NOTE — Telephone Encounter
Needing CC clearance letter faxed for pt to have surgery on right rotator cuff Friday 8/25

## 2021-10-16 ENCOUNTER — Encounter: Admit: 2021-10-16 | Discharge: 2021-10-16 | Payer: MEDICARE

## 2021-10-16 DIAGNOSIS — N529 Male erectile dysfunction, unspecified: Secondary | ICD-10-CM

## 2021-10-16 MED ORDER — LISINOPRIL 2.5 MG PO TAB
ORAL_TABLET | 0 refills
Start: 2021-10-16 — End: ?

## 2021-10-16 MED ORDER — TADALAFIL 5 MG PO TAB
5 mg | ORAL_TABLET | Freq: Every day | ORAL | 11 refills | Status: AC
Start: 2021-10-16 — End: ?

## 2021-10-17 ENCOUNTER — Encounter: Admit: 2021-10-17 | Discharge: 2021-10-17 | Payer: MEDICARE

## 2021-10-17 DIAGNOSIS — F332 Major depressive disorder, recurrent severe without psychotic features: Secondary | ICD-10-CM

## 2021-10-17 MED ORDER — BUPROPION XL 300 MG PO TB24
ORAL_TABLET | 2 refills
Start: 2021-10-17 — End: ?

## 2021-10-21 ENCOUNTER — Encounter: Admit: 2021-10-21 | Discharge: 2021-10-21 | Payer: MEDICARE

## 2021-11-14 ENCOUNTER — Encounter: Admit: 2021-11-14 | Discharge: 2021-11-14 | Payer: MEDICARE

## 2021-11-14 DIAGNOSIS — F332 Major depressive disorder, recurrent severe without psychotic features: Secondary | ICD-10-CM

## 2021-11-14 MED ORDER — LISINOPRIL 2.5 MG PO TAB
ORAL_TABLET | 0 refills
Start: 2021-11-14 — End: ?

## 2021-11-14 MED ORDER — BUPROPION XL 300 MG PO TB24
ORAL_TABLET | 2 refills
Start: 2021-11-14 — End: ?

## 2021-11-17 ENCOUNTER — Encounter: Admit: 2021-11-17 | Discharge: 2021-11-17 | Payer: MEDICARE

## 2021-11-17 MED ORDER — LISINOPRIL 2.5 MG PO TAB
ORAL_TABLET | 0 refills
Start: 2021-11-17 — End: ?

## 2021-12-02 ENCOUNTER — Encounter: Admit: 2021-12-02 | Discharge: 2021-12-02 | Payer: MEDICARE

## 2021-12-04 ENCOUNTER — Encounter: Admit: 2021-12-04 | Discharge: 2021-12-04 | Payer: MEDICARE

## 2021-12-04 MED ORDER — LORAZEPAM 1 MG PO TAB
ORAL_TABLET | 2 refills
Start: 2021-12-04 — End: ?

## 2021-12-10 ENCOUNTER — Encounter: Admit: 2021-12-10 | Discharge: 2021-12-10 | Payer: MEDICARE

## 2021-12-10 ENCOUNTER — Ambulatory Visit: Admit: 2021-12-10 | Discharge: 2021-12-11 | Payer: MEDICARE

## 2021-12-10 DIAGNOSIS — I10 Essential (primary) hypertension: Secondary | ICD-10-CM

## 2021-12-10 DIAGNOSIS — J45909 Unspecified asthma, uncomplicated: Secondary | ICD-10-CM

## 2021-12-10 DIAGNOSIS — E039 Hypothyroidism, unspecified: Secondary | ICD-10-CM

## 2021-12-10 DIAGNOSIS — E669 Obesity, unspecified: Secondary | ICD-10-CM

## 2021-12-10 DIAGNOSIS — F99 Mental disorder, not otherwise specified: Secondary | ICD-10-CM

## 2021-12-10 DIAGNOSIS — J449 Chronic obstructive pulmonary disease, unspecified: Secondary | ICD-10-CM

## 2021-12-10 DIAGNOSIS — G4733 Obstructive sleep apnea (adult) (pediatric): Secondary | ICD-10-CM

## 2021-12-10 DIAGNOSIS — G629 Polyneuropathy, unspecified: Secondary | ICD-10-CM

## 2021-12-10 DIAGNOSIS — R7989 Other specified abnormal findings of blood chemistry: Secondary | ICD-10-CM

## 2021-12-10 DIAGNOSIS — G4730 Sleep apnea, unspecified: Secondary | ICD-10-CM

## 2021-12-10 DIAGNOSIS — E119 Type 2 diabetes mellitus without complications: Secondary | ICD-10-CM

## 2021-12-10 MED ORDER — ALPROSTADIL(#) 20 MCG/ML IC SOLN
5 ug | INTRACAVERNOUS | 11 refills | Status: AC | PRN
Start: 2021-12-10 — End: ?

## 2021-12-10 MED ORDER — SYRINGE (DISPOSABLE) 1 ML MISC SYRG
11 refills | 28.00000 days | Status: AC
Start: 2021-12-10 — End: ?

## 2021-12-10 MED ORDER — NEEDLE (DISP) 27 GAUGE 27 GAUGE X 1/2" MISC NDLE
11 refills | 30.00000 days | Status: AC
Start: 2021-12-10 — End: ?

## 2021-12-10 MED ORDER — TADALAFIL 10 MG PO TAB
10 mg | ORAL_TABLET | Freq: Every day | ORAL | 11 refills | Status: AC
Start: 2021-12-10 — End: ?

## 2021-12-10 NOTE — Progress Notes
Date of Service: 12/10/2021     Subjective:             Thomas Francis is a 51 y.o. male    History of Present Illness    51 y.o. 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  Was on flomax but self stopped 2/2 deceased ejaculations,   Was switched to Uroxatrol having better ejaculations over the past few months.  He reports satisfactory urination while taking this medication.  No hematuria and dysuria.    Patient reports he is down to 250 pounds, reports significant improvement with diabetic control as well with medication altercations.  He is attempting to continue lose weight in order to make him a better candidate for inflatable penile prosthesis.  He has not perform penile injections for quite a while, was originally not working well but may choose to restart these after losing 34 additional pounds.  He does continue to report ongoing difficulties maintaining erections.  He has been considering purchasing a vacuum erection device for penile rehab.    Medical History:   Diagnosis Date   ? Asthma    ? COPD (chronic obstructive pulmonary disease) (HCC)    ? DM (diabetes mellitus) (HCC)    ? Hypertension    ? Hypothyroid    ? Low testosterone    ? Morbid obesity with BMI of 50.0-59.9, adult (HCC) 06/17/2017   ? 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 Obesity    ? Obstructive sleep apnea    ? Psychiatric illness    ? Sleep apnea        Surgical History:   Procedure Laterality Date   ? ABSCESS DRAINAGE Right 12/2016    [facility] right buttock cheek.   ? TRANSURETHRAL DRAINAGE ABSCESS - PROSTATE N/A 06/01/2017    Performed by Eveline Keto, MD at [facility] OR   ? ANGIOGRAPHY CORONARY ARTERY WITH LEFT HEART CATHETERIZATION N/A 07/14/2018    Performed by Myriam Jacobson, MD at [facility] CATH LAB   ? POSSIBLE PERCUTANEOUS CORONARY STENT PLACEMENT WITH ANGIOPLASTY N/A 07/14/2018    Performed by Myriam Jacobson, MD at [facility] CATH LAB   ? ESOPHAGOGASTRODUODENOSCOPY WITH SPECIMEN COLLECTION BY BRUSHING/ WASHING N/A 01/20/2019    Performed by Meyer Cory, MD at [facility] ENDO   ? ESOPHAGOGASTRODUODENOSCOPY WITH BIOPSY - FLEXIBLE N/A 01/20/2019    Performed by Meyer Cory, MD at [facility] ENDO   ? HX ROTATOR CUFF REPAIR Left 07/2021   ? TRANSURETHRAL ELECTROSURGICAL RESECTION PROSTATE - COMPLETE N/A 09/02/2021    Performed by Beather Arbour, MD at [facility] OR   ? TONSILLECTOMY         Family History   Problem Relation Age of Onset   ? None Reported Mother    ? Heart Disease Father        Current Outpatient Medications   Medication Sig Dispense Refill   ? acetaminophen (ACETAMINOPHEN EXTRA STRENGTH) 500 mg tablet Take two tablets by mouth every 6 hours as needed for Pain. Max of 4,000 mg of acetaminophen in 24 hours.  0   ? albuterol sulfate (PROAIR HFA) 90 mcg/actuation HFA aerosol inhaler INHALE 2 PUFFS BY MOUTH FOUR TIMES DAILY AS NEEDED FOR SHORTNESS OF BREATH     ? alfuzosin (UROXATRAL) 10 mg tablet Take one tablet by  mouth daily. 90 tablet 3   ? alprostadil 20 mcg/mL soln IC inj(cmpd) Inject five mcg into base of penis as directed as Needed. Max 1 dose/ 24-48 hrs. 10 mL 11   ? aspirin EC 81 mg tablet Take one tablet by mouth daily. Take with food.     ? buPROPion XL (WELLBUTRIN XL) 300 mg tablet Take one tablet by mouth every morning. Do not crush or chew. 30 tablet 3   ? cholecalciferol (vitamin D3) (OPTIMAL D3) 50,000 units capsule TAKE ONE CAPSULE BY MOUTH WEEKLY ON FRIDAY     ? diclofenac (VOLTAREN) 1 % topical gel Apply four g topically to affected area four times daily. (Patient taking differently: Apply four g topically to affected area four times daily as needed.) 300 g 3   ? docusate (COLACE) 100 mg capsule Take one capsule by mouth twice daily.     ? duloxetine DR (CYMBALTA) 30 mg capsule Take three capsules by mouth every morning. 90 capsule 3   ? fluticasone propionate (FLONASE ALLERGY RELIEF) 50 mcg/actuation nasal spray, suspension Inhale one spray by mouth into the lungs daily.     ? fluticasone propionate (FLOVENT DISKUS) 100 mcg/actuation inhalation disk Inhale two puffs by mouth into the lungs twice daily.     ? FREESTYLE LIBRE 2 SENSOR sensor Use one each as directed before meals and at bedtime. Use as directed.  Indications: type 2 diabetes mellitus 6 each 3   ? furosemide (LASIX) 20 mg tablet Take one tablet by mouth as Needed. As needed once daily for peripheral edema 90 tablet 0   ? gabapentin (NEURONTIN) 800 mg tablet Take one tablet by mouth three times daily.     ? guaiFENesin LA (MUCINEX) 600 mg tablet Take one tablet by mouth twice daily. (Patient taking differently: Take one tablet by mouth twice daily as needed.) 180 tablet 0   ? hydrOXYzine HCL (ATARAX) 25 mg tablet Take one tablet by mouth four times daily as needed for Anxiety. 120 tablet 3   ? hyoscyamine sulfate (LEVSIN/SL) 0.125 mg sublingual tablet Place one tablet under tongue every 4 hours as needed for Cramps. Bladder spasms. Max 1.5 mg/ day 30 tablet 0   ? ipratropium bromide (ATROVENT) 0.02 % nebulizer solution Inhale 2.5 mL by mouth into the lungs four times daily as needed for Shortness of Breath or Wheezing.     ? JARDIANCE 25 mg tablet Take one tablet by mouth daily.     ? lactobacillus rhamnosus (GG) (CULTURELLE) 10 billion cell cap Take one capsule by mouth daily with breakfast. 90 capsule 0   ? LEVEMIR FLEXPEN 100 unit/mL (3 mL) injection pen Inject sixty Units under the skin at bedtime daily.     ? levocetirizine (XYZAL) 5 mg tablet Take one tablet by mouth daily.     ? levothyroxine (SYNTHROID) 50 mcg tablet Take one tablet by mouth daily 30 minutes before breakfast.     ? lisinopriL (ZESTRIL) 2.5 mg tablet Hold this medication on discharge. Restart 09/10/21 with directions to take 1 tablet once daily. 30 tablet 0   ? loperamide HCl (IMODIUM PO) Take 1 tablet by mouth daily as needed for Diarrhea.     ? LORazepam (ATIVAN) 1 mg tablet Take one-half tablet by mouth twice daily as needed for Other... (severe anxiety or panic attacks). Do not refill before 09/14/2021 30 tablet 2   ? meloxicam (MOBIC) 15 mg tablet Hold this medication on discharge. Restart 09/10/21 with directions to take 1  tablet once daily.     ? metFORMIN-XR (GLUCOPHAGE XR) 500 mg extended release tablet Take two tablets by mouth twice daily with meals.     ? metoprolol succinate XL (TOPROL XL) 25 mg extended release tablet Take one tablet by mouth at bedtime daily. 90 tablet 1   ? Needle (Disp) 27 G 27 gauge x 1/2 ndle Use as directed. 20 each 11   ? ondansetron (ZOFRAN ODT) 4 mg rapid dissolve tablet as Needed.     ? OZEMPIC 1 mg/dose (4 mg/3 mL) injection PEN Inject one mg under the skin every 7 days.     ? phenazopyridine (PYRIDIUM) 200 mg tablet Take one tablet by mouth three times daily as needed for Pain. Max 3 days. 6 tablet 0   ? phentermine (ADIPEX-P) 37.5 mg capsule Take one capsule by mouth daily.     ? sennosides-docusate sodium (SENOKOT-S) 8.6/50 mg tablet Take one tablet by mouth twice daily. 90 tablet 0   ? syringe (disposable) 1 mL Use as directed. 12 each 11   ? tadalafiL (CIALIS) 10 mg tablet Take one tablet by mouth daily. prior to sexual activity 30 tablet 11   ? testosterone cypionate (DEPO-TESTOSTERONE) 200 mg/mL injection Inject 1 mL into the muscle every 14 days.     ? tiZANidine (ZANAFLEX) 4 mg tablet Take one tablet by mouth every 6 hours as needed. 15 tablet 3   ? vitamins, multiple tablet Take one tablet by mouth daily.     ? zolpidem (AMBIEN) 5 mg tablet TAKE ONE TABLET BY MOUTH AT BEDTIME AS NEEDED FOR SLEEP 30 tablet 0     No current facility-administered medications for this visit.       Allergies   Allergen Reactions   ? Vancomycin SEE COMMENTS     PT HAD AKI KIDNEY FAILURE FROM VANCOMYCIN   ? Ketamine SEE COMMENTS     STOPPED BREATHING WHEN GIVEN IV TOO FAST  STOPPED BREATHING WHEN GIVEN IV TOO FAST Social History     Socioeconomic History   ? Marital status: Single   Tobacco Use   ? Smoking status: Never   ? Smokeless tobacco: Former     Types: Chew     Quit date: 1990   Vaping Use   ? Vaping Use: Never used   Substance and Sexual Activity   ? Alcohol use: Yes     Comment: social drinker   ? Drug use: Not Currently   ? Sexual activity: Not Currently         Review of Systems    Objective:         ? acetaminophen (ACETAMINOPHEN EXTRA STRENGTH) 500 mg tablet Take two tablets by mouth every 6 hours as needed for Pain. Max of 4,000 mg of acetaminophen in 24 hours.   ? albuterol sulfate (PROAIR HFA) 90 mcg/actuation HFA aerosol inhaler INHALE 2 PUFFS BY MOUTH FOUR TIMES DAILY AS NEEDED FOR SHORTNESS OF BREATH   ? alfuzosin (UROXATRAL) 10 mg tablet Take one tablet by mouth daily.   ? alprostadil 20 mcg/mL soln IC inj(cmpd) Inject five mcg into base of penis as directed as Needed. Max 1 dose/ 24-48 hrs.   ? aspirin EC 81 mg tablet Take one tablet by mouth daily. Take with food.   ? buPROPion XL (WELLBUTRIN XL) 300 mg tablet Take one tablet by mouth every morning. Do not crush or chew.   ? cholecalciferol (vitamin D3) (OPTIMAL D3) 50,000 units capsule TAKE ONE CAPSULE BY  MOUTH WEEKLY ON FRIDAY   ? diclofenac (VOLTAREN) 1 % topical gel Apply four g topically to affected area four times daily. (Patient taking differently: Apply four g topically to affected area four times daily as needed.)   ? docusate (COLACE) 100 mg capsule Take one capsule by mouth twice daily.   ? duloxetine DR (CYMBALTA) 30 mg capsule Take three capsules by mouth every morning.   ? fluticasone propionate (FLONASE ALLERGY RELIEF) 50 mcg/actuation nasal spray, suspension Inhale one spray by mouth into the lungs daily.   ? fluticasone propionate (FLOVENT DISKUS) 100 mcg/actuation inhalation disk Inhale two puffs by mouth into the lungs twice daily.   ? FREESTYLE LIBRE 2 SENSOR sensor Use one each as directed before meals and at bedtime. Use as directed.  Indications: type 2 diabetes mellitus   ? furosemide (LASIX) 20 mg tablet Take one tablet by mouth as Needed. As needed once daily for peripheral edema   ? gabapentin (NEURONTIN) 800 mg tablet Take one tablet by mouth three times daily.   ? guaiFENesin LA (MUCINEX) 600 mg tablet Take one tablet by mouth twice daily. (Patient taking differently: Take one tablet by mouth twice daily as needed.)   ? hydrOXYzine HCL (ATARAX) 25 mg tablet Take one tablet by mouth four times daily as needed for Anxiety.   ? hyoscyamine sulfate (LEVSIN/SL) 0.125 mg sublingual tablet Place one tablet under tongue every 4 hours as needed for Cramps. Bladder spasms. Max 1.5 mg/ day   ? ipratropium bromide (ATROVENT) 0.02 % nebulizer solution Inhale 2.5 mL by mouth into the lungs four times daily as needed for Shortness of Breath or Wheezing.   ? JARDIANCE 25 mg tablet Take one tablet by mouth daily.   ? lactobacillus rhamnosus (GG) (CULTURELLE) 10 billion cell cap Take one capsule by mouth daily with breakfast.   ? LEVEMIR FLEXPEN 100 unit/mL (3 mL) injection pen Inject sixty Units under the skin at bedtime daily.   ? levocetirizine (XYZAL) 5 mg tablet Take one tablet by mouth daily.   ? levothyroxine (SYNTHROID) 50 mcg tablet Take one tablet by mouth daily 30 minutes before breakfast.   ? lisinopriL (ZESTRIL) 2.5 mg tablet Hold this medication on discharge. Restart 09/10/21 with directions to take 1 tablet once daily.   ? loperamide HCl (IMODIUM PO) Take 1 tablet by mouth daily as needed for Diarrhea.   ? LORazepam (ATIVAN) 1 mg tablet Take one-half tablet by mouth twice daily as needed for Other... (severe anxiety or panic attacks). Do not refill before 09/14/2021   ? meloxicam (MOBIC) 15 mg tablet Hold this medication on discharge. Restart 09/10/21 with directions to take 1 tablet once daily.   ? metFORMIN-XR (GLUCOPHAGE XR) 500 mg extended release tablet Take two tablets by mouth twice daily with meals.   ? metoprolol succinate XL (TOPROL XL) 25 mg extended release tablet Take one tablet by mouth at bedtime daily.   ? Needle (Disp) 27 G 27 gauge x 1/2 ndle Use as directed.   ? ondansetron (ZOFRAN ODT) 4 mg rapid dissolve tablet as Needed.   ? OZEMPIC 1 mg/dose (4 mg/3 mL) injection PEN Inject one mg under the skin every 7 days.   ? phenazopyridine (PYRIDIUM) 200 mg tablet Take one tablet by mouth three times daily as needed for Pain. Max 3 days.   ? phentermine (ADIPEX-P) 37.5 mg capsule Take one capsule by mouth daily.   ? sennosides-docusate sodium (SENOKOT-S) 8.6/50 mg tablet Take one tablet by mouth twice  daily.   ? syringe (disposable) 1 mL Use as directed.   ? tadalafiL (CIALIS) 10 mg tablet Take one tablet by mouth daily. prior to sexual activity   ? testosterone cypionate (DEPO-TESTOSTERONE) 200 mg/mL injection Inject 1 mL into the muscle every 14 days.   ? tiZANidine (ZANAFLEX) 4 mg tablet Take one tablet by mouth every 6 hours as needed.   ? vitamins, multiple tablet Take one tablet by mouth daily.   ? zolpidem (AMBIEN) 5 mg tablet TAKE ONE TABLET BY MOUTH AT BEDTIME AS NEEDED FOR SLEEP     There were no vitals filed for this visit.  There is no height or weight on file to calculate BMI.       Physical Exam       Assessment and Plan:    Problem   Erectile Dysfunction     Erectile dysfunction  51 year old male returns via telehealth for additional evaluation of erectile dysfunction, has previously been time penile injections and most recently has been meeting with Dr. Letitia Neri for consideration of inflatable penile prosthesis.  Patient has recently lost greater than 150 pounds, continues to work on diabetic control, which he believes his last A1c was around 5.2.  Discussed with patient use of vacuum erection device and penile injections for ongoing management of erectile dysfunction.  Patient also reports interest in increasing his Cialis from 5 to 10 mg daily, which I discussed could be used off label.    Patient will plan to pursue Cialis 10 mg daily, may choose to purchase vacuum erection device and perform additional trials of penile injections, which patient reports comfort with performing.  Instructed patient to call or send MyChart message if he had any concerns regarding any interventions for erections.        Orders Placed This Encounter   ? Needle (Disp) 27 G 27 gauge x 1/2 ndle   ? syringe (disposable) 1 mL   ? alprostadil 20 mcg/mL soln IC inj(cmpd)   ? tadalafiL (CIALIS) 10 mg tablet     Return if symptoms worsen or fail to improve.      Abby Potash, APRN-NP  Department of Urology

## 2021-12-10 NOTE — Patient Instructions
Penile Self Injection Instructions    Use only the medication, needles, and syringes prescribed.  Never share your medication with anyone.    Use needles and & syringes once.    After use, dispose of the needle and syringe in a sharps container made for disposing used needles.    Never exceed the dosage that has been prescribed without talking to your doctor or nurse.    Pre-mixed alprostadil solution needs to be kept refrigerated.  Alprostadil in the powder form does not need to be kept refrigerated.  Remember to check the expiration date.  If you travel, seal the vial in a plastic bag and pack with ice in an insulated container.  Double check with your airline policy regarding travelling with liquid medication, syringes, & needles.    Once you have drawn up your medication you are ready to give your injection.    Grasp the head of the penis with your thumb and fingers of one hand.  If you are uncircumcised, it is important to retract the foreskin prior to pulling the penis out straight in front of you.    Clean the penis vigorously with an alcohol swab at the injection site (remember to alternate the side of the penis), on the side of the penis between 1 o?clock and 3 o?clock or between 9 o?clock and 11 o?clock.  Never inject the top of the penis, the underside, the urethra, or the head of the penis.  Avoid any visible veins.    Hold the syringe with the other hand and touch the needle to the surface of the skin selected for the injection.  Hold the needle at a right angle (90?) to the surface of the skin.  Insert the needle until it is buried in the skin to the hub.  Slowly inject the medication.  Do not force the medication.  If you meet resistance, draw back a little on the needle and try to inject again.  If you still meet resistance, select a new injection site.         Figure 1:  Inject within the shaded area.    After injecting all of the medication, remove the needle and apply pressure over the injection site for a few minutes to help minimize bruising.    Do not inject more than once every 24 - 48 hours.    Do not use the same site for every injection.  Remember to alternate sides and move up and down along the shaft of the penis for the injection.    Priapism is a prolonged erection that lasts several hours and/ or becomes painful.  If untreated, priapism lasting over 6 - 8 hours causes irreversible damage that may result in erectile dysfunction.  It is extremely important to seek medical care if this occurs from the use of your injection.  Treatment may include medication to reverse the erection or possibly surgery.    If an erections starts to last longer than you think it should and/or starts to become painful, you can try taking Pseudoephedrine (Sudafed) 60 - 120 mg, which may help allow the blood vessels to relax.  This is one of those medications that is ?over-the-counter,? but you need to request it at the pharmacy counter.  It will not be on the shelves at the store.    If an erection lasts more than four hours, this is a medical emergency!  You should call your doctor?s office if this occurs between the hours  of 8:00 am to 4:30 pm.      For evenings, nights, and weekends, contact the urology resident on call at [phone].  If you are from out of town or unable to make contact with the urologist, proceed to your nearest emergency room.    Never exceed your recommended dosage without discussing it with your doctor or nurse.    ===================================================================    [facility]   [address]   [place], [place] [address]   [phone]   [phone]   [email or web address]

## 2021-12-10 NOTE — Assessment & Plan Note
51 year old male returns via telehealth for additional evaluation of erectile dysfunction, has previously been time penile injections and most recently has been meeting with Dr. Josephine Cables for consideration of inflatable penile prosthesis.  Patient has recently lost greater than 150 pounds, continues to work on diabetic control, which he believes his last A1c was around 5.2.  Discussed with patient use of vacuum erection device and penile injections for ongoing management of erectile dysfunction.  Patient also reports interest in increasing his Cialis from 5 to 10 mg daily, which I discussed could be used off label.    Patient will plan to pursue Cialis 10 mg daily, may choose to purchase vacuum erection device and perform additional trials of penile injections, which patient reports comfort with performing.  Instructed patient to call or send MyChart message if he had any concerns regarding any interventions for erections.

## 2021-12-11 DIAGNOSIS — N529 Male erectile dysfunction, unspecified: Secondary | ICD-10-CM

## 2021-12-23 ENCOUNTER — Encounter: Admit: 2021-12-23 | Discharge: 2021-12-23 | Payer: MEDICARE

## 2021-12-25 ENCOUNTER — Encounter: Admit: 2021-12-25 | Discharge: 2021-12-25 | Payer: MEDICARE

## 2021-12-25 MED ORDER — LORAZEPAM 1 MG PO TAB
.5 mg | ORAL_TABLET | Freq: Two times a day (BID) | ORAL | 2 refills | 12.00000 days | Status: AC | PRN
Start: 2021-12-25 — End: ?

## 2021-12-25 NOTE — Telephone Encounter
PDMP reviewed. No evidence of unexpected refills. Patient last filled lorazepam on 11/24/2021 and is due for new prescription. Patient was last seen in the clinic on 08/29/2021 and is scheduled to be seen on 03/04/2022. 30 day supply with refills was provided.

## 2022-01-12 ENCOUNTER — Encounter: Admit: 2022-01-12 | Discharge: 2022-01-12 | Payer: MEDICARE

## 2022-01-23 ENCOUNTER — Encounter: Admit: 2022-01-23 | Discharge: 2022-01-23 | Payer: MEDICARE

## 2022-01-23 DIAGNOSIS — F439 Reaction to severe stress, unspecified: Secondary | ICD-10-CM

## 2022-01-23 MED ORDER — METOPROLOL SUCCINATE 25 MG PO TB24
25 mg | ORAL_TABLET | Freq: Every evening | ORAL | 1 refills | 90.00000 days | Status: AC
Start: 2022-01-23 — End: ?

## 2022-01-23 MED ORDER — HYDROXYZINE HCL 25 MG PO TAB
25 mg | ORAL_TABLET | Freq: Four times a day (QID) | ORAL | 3 refills | 30.00000 days | Status: AC | PRN
Start: 2022-01-23 — End: ?

## 2022-01-26 ENCOUNTER — Encounter: Admit: 2022-01-26 | Discharge: 2022-01-26 | Payer: MEDICARE

## 2022-02-02 ENCOUNTER — Encounter: Admit: 2022-02-02 | Discharge: 2022-02-02 | Payer: MEDICARE

## 2022-02-02 ENCOUNTER — Ambulatory Visit: Admit: 2022-02-02 | Discharge: 2022-02-03 | Payer: MEDICARE

## 2022-02-02 DIAGNOSIS — F439 Reaction to severe stress, unspecified: Secondary | ICD-10-CM

## 2022-02-03 DIAGNOSIS — F332 Major depressive disorder, recurrent severe without psychotic features: Secondary | ICD-10-CM

## 2022-02-03 DIAGNOSIS — F603 Borderline personality disorder: Secondary | ICD-10-CM

## 2022-02-06 ENCOUNTER — Encounter: Admit: 2022-02-06 | Discharge: 2022-02-06 | Payer: MEDICARE

## 2022-02-11 ENCOUNTER — Encounter: Admit: 2022-02-11 | Discharge: 2022-02-11 | Payer: MEDICARE

## 2022-02-11 NOTE — Progress Notes
Thomas Francis. Thomas Francis, January 30, 1971 has an appointment with Dr. Neale Burly on 02/18/22.    Please send recent lab results for continuity of care.    Thank you,   Jesten Cappuccio    Phone: [phone]  Fax: [phone]

## 2022-02-18 ENCOUNTER — Encounter: Admit: 2022-02-18 | Discharge: 2022-02-18 | Payer: MEDICARE

## 2022-02-18 NOTE — Telephone Encounter
02/18/2022 12:57 PM     Pt called to inform staff that he has been dx w/ COVID so cancelled appt with [facility] today; requesting f/u appt be scheduled.     Last saw J[facility] 10/23/20. Pt added to waitlist

## 2022-02-23 ENCOUNTER — Encounter: Admit: 2022-02-23 | Discharge: 2022-02-23 | Payer: MEDICARE

## 2022-02-23 DIAGNOSIS — F332 Major depressive disorder, recurrent severe without psychotic features: Secondary | ICD-10-CM

## 2022-02-23 MED ORDER — BUPROPION XL 300 MG PO TB24
300 mg | ORAL_TABLET | Freq: Every morning | ORAL | 3 refills
Start: 2022-02-23 — End: ?

## 2022-02-25 ENCOUNTER — Encounter: Admit: 2022-02-25 | Discharge: 2022-02-25 | Payer: MEDICARE

## 2022-02-25 ENCOUNTER — Ambulatory Visit: Admit: 2022-02-25 | Discharge: 2022-02-26 | Payer: MEDICARE

## 2022-02-25 DIAGNOSIS — F439 Reaction to severe stress, unspecified: Secondary | ICD-10-CM

## 2022-02-25 DIAGNOSIS — F332 Major depressive disorder, recurrent severe without psychotic features: Secondary | ICD-10-CM
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
## 2022-02-27 NOTE — Patient Instructions
Post Traumatic Stress Disorder (PTSD) Information and Evidenced Based Treatments:    National Center for PTSD  https://www.ptsd.va.gov/appvid/video/index.asp    Cognitive Processing Therapy  https://cptforptsd.com/    Youtube 14 minute video describing CPT for PTSD:  https://www.youtube.com/watch?v=BOZEMdwU9Ec    Whiteboard Talks on CPT: Videos to describe each assignment  https://cptforptsd.com/cpt-resources/    CPT Coach  An App you may want to utilize during treatment  To learn more watch this 4 minute Youtube video about the CPT Coach App  https://www.youtube.com/watch?v=CE2XGZZeidE    PTSD Coach App  App to assist in managing PTSD symptoms  To learn more watch 3 minute Youtube video:  https://www.youtube.com/watch?v=tPnpVp3xupg    Podcast describing CPT: [email or web address]://[email or web address]      Local Warm Line (not for crisis):  [facility] Warmline            phone: [phone] or [phone] (toll free)  [facility]                         4:00pm - 10:00pm every evening    Local Crisis Lines:      Name: [place] Crisis [facility] 24/7                phone:    [phone]      Name: [place] [facility] 24/7                phone:    [phone]      Name: [place] [facility] Crisis Line 24/7            phone:    [phone]    National Suicide Prevention Lifelines & Textlines:      Dial or Text:       988                                     or chat: 988lifeline.org     or text:  [place] [facility] - Text HOPE to 20121       Option for Hearing Impaired:     Dial 711 then 988 - Text Telephone     Local emergency services:       [facility] - Emergency Department            [address]      [place], [place] [address]                                 phone: [phone]    Or call 911

## 2022-03-04 ENCOUNTER — Ambulatory Visit: Admit: 2022-03-04 | Discharge: 2022-03-05 | Payer: MEDICARE

## 2022-03-04 ENCOUNTER — Encounter: Admit: 2022-03-04 | Discharge: 2022-03-04 | Payer: MEDICARE

## 2022-03-04 DIAGNOSIS — F4312 Post-traumatic stress disorder, chronic: Secondary | ICD-10-CM

## 2022-03-04 DIAGNOSIS — F332 Major depressive disorder, recurrent severe without psychotic features: Secondary | ICD-10-CM

## 2022-03-05 ENCOUNTER — Encounter: Admit: 2022-03-05 | Discharge: 2022-03-05 | Payer: MEDICARE

## 2022-03-13 ENCOUNTER — Encounter: Admit: 2022-03-13 | Discharge: 2022-03-13 | Payer: MEDICARE

## 2022-03-15 ENCOUNTER — Encounter: Admit: 2022-03-15 | Discharge: 2022-03-15 | Payer: MEDICARE

## 2022-03-15 MED ORDER — LORAZEPAM 1 MG PO TAB
ORAL_TABLET | 2 refills
Start: 2022-03-15 — End: ?

## 2022-03-18 ENCOUNTER — Encounter: Admit: 2022-03-18 | Discharge: 2022-03-18 | Payer: MEDICARE

## 2022-03-18 NOTE — Telephone Encounter
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  Informed the client that it is too late to meet today as there is not time to have his appointment (was not yet connected to mychart), and recommended he call clinic and schedule appointment for later this week.  He is in agreement with this plan and will call to schedule.

## 2022-03-19 NOTE — Patient Instructions
Local Warm Line (not for crisis):  [facility]            phone: [phone] or [phone] (toll free)  [facility]                         4:00pm - 10:00pm every evening    Local Crisis Lines:      Name: [place] Crisis Line 24/7                phone:    [phone]      Name: [place] [facility] 24/7                phone:    [phone]      Name: [place] [facility] Crisis Line 24/7            phone:    [phone]    National Suicide Prevention Lifelines & Textlines:      Dial or Text:       988                                     or chat: 988lifeline.org     or text:  [place] [place] [facility] - Text HOPE to 20121       Option for Hearing Impaired:     Dial 711 then 988 - Text Telephone     Local emergency services:       [facility] - Emergency Department            [address]      [place], [place] [address]                                 phone: [phone]    Or call 911    Post Traumatic Stress Disorder (PTSD) Information and Evidenced Based Treatments:    National Center for PTSD  https://www.ptsd.va.gov/appvid/video/index.asp    Cognitive Processing Therapy  https://cptforptsd.com/    Youtube 14 minute video describing CPT for PTSD:  https://www.youtube.com/watch?v=BOZEMdwU9Ec    Whiteboard Talks on CPT: Videos to describe each assignment  https://cptforptsd.com/cpt-resources/    CPT Coach  An App you may want to utilize during treatment  To learn more watch this 4 minute Youtube video about the CPT Coach App  https://www.youtube.com/watch?v=CE2XGZZeidE    PTSD Coach App  App to assist in managing PTSD symptoms  To learn more watch 3 minute Youtube video:  https://www.youtube.com/watch?v=tPnpVp3xupg    Podcast describing CPT: [email or web address]

## 2022-03-20 ENCOUNTER — Encounter: Admit: 2022-03-20 | Discharge: 2022-03-20 | Payer: MEDICARE

## 2022-03-20 ENCOUNTER — Ambulatory Visit: Admit: 2022-03-20 | Discharge: 2022-03-21 | Payer: MEDICARE

## 2022-03-20 DIAGNOSIS — F4312 Post-traumatic stress disorder, chronic: Secondary | ICD-10-CM

## 2022-03-20 DIAGNOSIS — F332 Major depressive disorder, recurrent severe without psychotic features: Secondary | ICD-10-CM

## 2022-03-20 NOTE — Progress Notes
Thomas Francis   03/20/22   52 y.o.     Telehealth BOV    Start Visit Time: 2:12 p.m.  Stop Visit Time: 2:52 p.m.      PRESENTATION, BEHAVIORS,SYMPTOMS AND SESSION CONTENT (HPI):   The client arrived late to his telehealth appointment and did not have his assignments with him saying he had left them at his mother's home.  When asked about avoidance, Mr. Thomas Francis spoke about his increase in medical issues this last several weeks and also that he has an increase in fears of doing something to myself when asked specifically the client denied suicidal ideation with intent or plan, but rather episodes of passive SI.  Reports a decrease in functioning as evidenced by increase in sleep for avoidance, not getting out as often, and does not enjoy activities he usually does such as watching football.  Reviewed with the client that should he experience suicidal ideation is important that he seek help immediately by going to the emergency room.  Reviewed that should the client need a higher level of care because of decrease in functioning and increase in depression he may want to consider an IOP and provided the client the contact information for [facility] since he reports his Medicare is not active at the present time and therefore he would need a treatment program in [place] that we might accept Medicaid.  Encouraged him to call the number to see if the IOP was an option for him and to learn more about the service.    We then explored the client's avoidance to completing assignments and he reported he is afraid of opening Pandora's box, as well as fears of not getting better.   This Clinical research associate asked the client some questions to clarify that he has not been feeling good in his life and is interested in changing and reinforced that in the therapy we are not asking him to recall every detail of his traumatic event but rather help [place] understand the beliefs that came from that which keep him stuck in PTSD.  Worked with the client on identifying a belief about why he thought the traumatic event identified from the impact statement happened as well as 2 ways that has affected his life related to safety and trust.  Gave the client positive feedback for being able to do this and reinforced that this is what we want him to do with the assignment.   Reassigned the client to do the impact statement and also encouraged him to attend in person appointment as a way of decreasing avoidance.      Assigned practice:  Write Impact statement  Read over handouts on PTSD symptoms and Stuck Points  Complete PHQ 9 and PCL 5 for next appointment  Reviewed the client's reaction to session and practice assignments.      Made plan to follow-up in 1 week.        MODALITY:  [] Family Therapy/Marital Therapy  [x] Individual Psychotherapy  [] Group Therapy    INTERVENTIONS:  [] Eclectic Cognitive Behavioral Therapy  [] Dynamic Deconstructive Psychotherapy  [] Dialectical Behavioral Therapy Interventions  [] Trauma Informed Treatment  [] Mindfulness / Compassion Based   [] Psychoeducation  [] Supportive  [] Grief Counseling  [] Psychodynamic  [] Interpersonal Effectiveness  [] Relapse Prevention  [] Addictions Treatment  [] Crisis Intervention  [] Crisis Planning  []  Problem Solving Interventions  [x]  Cognitive Processing Therapy  []  Other:     RESPONSE:  []  Slight Symptom Improvement reported/observed  []  Significant Symptom Improvement reported/observed  []  Symptoms Stable  []   Slight Worsening of Symptoms  [x]  Moderate worsening of symptoms per report  []  Significant Worsening of Symptoms  []  No change reported  []  Other:       MENTAL STATUS EXAM:  Mood: Anxious, depressed  Affect: Anxious  Thought content: Denies active SI, reports episodes of passive SI without intent or plan  Thought process: Mildly circumstantial but redirectable  Perceptions: No AH; no VH  Orientation: X 3  Motivation: Did not review  Speech: Spontaneous, very verbal, rapid and pressured at times  ADL's: Independent  Fund of knowledge: Appropriate to age and educational level  Attention and Concentration: Decreased per report  Memory: Appears intact  Insight/judgement: Fair/fair    HEALTH RISK FACTORS:  Multiple medical issues including chronic pain      General Constitution:  Appearance: Casual dress, good hygiene per report, appears stated age  Eye contact -appears good (Zoom appointment)  []  Cooperative during appointment  []  Guarded during appointment  []  Uncooperative during appointment  [x]  some avoidant behavior in session    No evidence of imminent risk of harm to self or others        IMPRESSION/DIAGNOSIS (DSM 5):  Axis I: Major Depressive Disorder, recurrent, severe without psychotic features; Trauma Related Disorder; provisional PTSD  Axis II: History of Borderline Personality Disorder  Axis III: Multiple  Axis IV: Health issues, chronic mental health issues, disability, lack of social supports, financial stress      GOALS AND TREATMENT PLAN:  [x]  Continue Individual Psychotherapy  []  Continue Group Therapy  []  Continue Family Therapy    Coordinate care with MD  Mental health crisis information included in AVS  []  SMART Recovery Information included in AVS  []  AA Resources included in AVS  [x]  Homework given: See progress note  Client may call and check in if needed  In case of emergency call 911 or go to the nearest emergency room  Follow up in 1 week    Client's goal for treatment identified at initial evaluation: Address trauma symptoms, improve mood, decreased anxiety    The proposed treatment plan was discussed with the patient who was provided the opportunity to ask questions and make suggestions regarding alternative treatment.     This note was completed using Scientist, clinical (histocompatibility and immunogenetics), please excuse typographical errors.    Thomas Francis Thomas Francis, LSCSW

## 2022-03-24 ENCOUNTER — Encounter: Admit: 2022-03-24 | Discharge: 2022-03-24 | Payer: MEDICARE

## 2022-03-25 ENCOUNTER — Encounter: Admit: 2022-03-25 | Discharge: 2022-03-25 | Payer: MEDICARE

## 2022-03-25 DIAGNOSIS — F332 Major depressive disorder, recurrent severe without psychotic features: Secondary | ICD-10-CM

## 2022-03-25 MED ORDER — BUPROPION XL 300 MG PO TB24
300 mg | ORAL_TABLET | Freq: Every morning | ORAL | 0 refills | Status: AC
Start: 2022-03-25 — End: ?

## 2022-04-01 ENCOUNTER — Encounter: Admit: 2022-04-01 | Discharge: 2022-04-01 | Payer: MEDICARE

## 2022-04-08 ENCOUNTER — Ambulatory Visit: Admit: 2022-04-08 | Discharge: 2022-04-09 | Payer: MEDICARE

## 2022-04-08 DIAGNOSIS — F4312 Post-traumatic stress disorder, chronic: Secondary | ICD-10-CM

## 2022-04-08 DIAGNOSIS — F331 Major depressive disorder, recurrent, moderate: Secondary | ICD-10-CM

## 2022-04-08 NOTE — Patient Instructions
I will be out of the office April 20, 2022 - March 10., 2024.  Should you need assistance during this time please call the Outpatient Psychiatry Clinic at [phone].  I case of emergency call 911 or go to the nearest emergency room.    Local Warm Line (not for crisis):  [facility] Warm[facility]            phone: [phone] or [phone] (toll free)  [facility]                         4:00pm - 10:00pm every evening    Local Crisis Lines:      Name: [place] 24/7                phone:    [phone]      Name: [place] 24/7                phone:    [phone]      Name: [place] 24/7            phone:    [phone]    National Suicide Prevention Lifelines & Textlines:      Dial or Text:       988                                     or chat: 988lifeline.org     or text:  [place] to [address]       Option for Hearing Impaired:     [id number] then 988 - Text Telephone     Local emergency services:       [facility] [place] [facility]            [address]      [place], [place] [address]                                 phone: [phone]    Or call 911

## 2022-04-15 ENCOUNTER — Encounter: Admit: 2022-04-15 | Discharge: 2022-04-15 | Payer: MEDICARE

## 2022-04-15 ENCOUNTER — Ambulatory Visit: Admit: 2022-04-15 | Discharge: 2022-04-16 | Payer: MEDICARE

## 2022-04-15 NOTE — Patient Instructions
I will be out of the office April 20, 2022 - March 10., 2024.  Should you need assistance during this time please call the Outpatient Psychiatry Clinic at [phone].  I case of emergency call 911 or go to the nearest emergency room.      Post Traumatic Stress Disorder (PTSD) Information and Evidenced Based Treatments:    [facility] for PTSD  https://www.ptsd.[email or web address].pt.asp    Cognitive Processing Therapy  https://[email or web address]    Youtube 14 minute video describing CPT for PTSD:  [email or web address].se    Whiteboard Talks on CPT: Videos to describe each assignment  https://cptforptsd.com/cpt-resources/    CPT Coach  An App you may want to utilize during treatment  To learn more watch this 4 minute Youtube video about the CPT Coach App  [email or web address].it    PTSD Coach App  App to assist in managing PTSD symptoms  To learn more watch 3 minute Youtube video:  BodyPens.ca    Podcast describing CPT: [email or web address]      Local Warm Line (not for crisis):  [facility] Warmline            phone: [phone] or [phone] (toll free)  [facility]                         4:00pm - 10:00pm every evening    Local Crisis Lines:      Name: [place] Crisis Line 24/7                phone:    [phone]      Name: [facility] 24/7                phone:    [phone]      Name: [facility] Crisis Line 24/7            phone:    [phone]    National Suicide Prevention Lifelines & Textlines:      Dial or Text:       988                                     or chat: 988lifeline.org     or text:  [facility] - Text HOPE to 20121       Option for Hearing Impaired:     Dial 711 then 988 - Text Telephone     Local emergency services:       [facility] - Emergency Department            [address]      [place], [place] [address]                                 phone: [phone]    Or call 911

## 2022-04-22 ENCOUNTER — Encounter: Admit: 2022-04-22 | Discharge: 2022-04-22 | Payer: MEDICARE

## 2022-04-22 DIAGNOSIS — F332 Major depressive disorder, recurrent severe without psychotic features: Secondary | ICD-10-CM

## 2022-04-22 MED ORDER — BUPROPION XL 300 MG PO TB24
300 mg | ORAL_TABLET | Freq: Every morning | ORAL | 0 refills | Status: AC
Start: 2022-04-22 — End: ?

## 2022-04-29 ENCOUNTER — Ambulatory Visit: Admit: 2022-04-29 | Discharge: 2022-04-30 | Payer: MEDICARE

## 2022-04-29 NOTE — Patient Instructions
Local Warm Line (not for crisis):  [facility]            phone: [phone] or [phone] (toll free)  [facility]                         4:00pm - 10:00pm every evening    Local Crisis Lines:      Name: [place] [place] [facility] 24/7                phone:    [phone]      Name: [facility]                phone:    [phone]      Name: [place] [facility] 24/7            phone:    [phone]    National Suicide Prevention Lifelines & Textlines:      Dial or Text:       988                                     or chat: 988lifeline.org     or text:  [facility] - Text HOPE to 20121       Option for Hearing Impaired:     Dial 711 then 988 - Text Telephone     Local emergency services:       [facility] - Emergency Department            [address]      [place], [place] [address]                                 phone: [phone]    Or call 911    Post Traumatic Stress Disorder (PTSD) Information and Evidenced Based Treatments:    National Center for PTSD  https://www.ptsd.va.gov/appvid/video/index.asp    Cognitive Processing Therapy  https://cptforptsd.com/    Youtube 14 minute video describing CPT for PTSD:  https://www.youtube.com/watch?v=BOZEMdwU9Ec    Whiteboard Talks on CPT: Videos to describe each assignment  https://cptforptsd.com/cpt-resources/    CPT Coach  An App you may want to utilize during treatment  To learn more watch this 4 minute Youtube video about the CPT Coach App  https://www.youtube.com/watch?v=CE2XGZZeidE    PTSD Coach App  App to assist in managing PTSD symptoms  To learn more watch 3 minute Youtube video:  https://www.youtube.com/watch?v=tPnpVp3xupg    Podcast describing CPT: [email or web address]/[email or web address]

## 2022-05-08 NOTE — Progress Notes
Date of Service: 05/13/2022    Thomas Francis is a 52 y.o. male who presents to the [facility] for medication management follow-up appointment.   Psychiatric history: Major depressive disorder, post-traumatic stress disorder, benzodiazepine/opioid dependence, borderline personality disorder  Medical history: Hypertension, right bundle branch block, diabetes mellitus type 2, chronic kidney disease, chronic obstructive pulmonary disease, sleep apnea  Current psychotropic medications:   Duloxetine 60 mg daily, mood, trauma  Bupropion XL 300 mg every morning, mood  Hydroxyzine 25 mg up to four times daily as needed for mild to moderate anxiety  Lorazepam 0.5 mg twice daily as needed for severe anxiety - last filled 04/22/2022  Notably the patient is taking zolpidem, oxycodone, and gabapentin prescribed by his primary care provider.  Last mental health appointment: 08/29/2021 with this provider. Lorazepam was reduced    Subjective:          His mental health has been not the best. He states that he has been worse in the past, but it is not his best. He has been trying to wean off some of his psychotropic medications but he has recently struggled more with anxiety. His father died at the end of 2021/09/24. There was a lot of family drama associated with his father's death. He no longer has relationships with some of his family members due to the conflict. This brought up feelings related to nephew's suicide in the past. He felt like he was doing well prior to his father's death, but his trauma-related symptoms have flared. He has a hard time focusing on watching [place] basketball or [place] football. His mind feels like it is racing and he has difficulty with focus. His depression has worsened due to avoidance behaviors. This causes the patient to isolate and does not allow friends/family to come over since he avoids the house chores. He finds it difficult to shut down sometimes. He is working closely with June Moore in psychotherapy sessions. He acknowledges that some of the anxiety assignments are easier said than done. He feels more anxious when he is out and around other people, which has worsened over the last couple of years. He is trying to forgive his siblings. He is taking hydroxyzine scheduled three times daily. He takes lorazepam when he has breakthrough anxiety. He typically takes lorazepam twice daily. He has intermittent, chronic passive suicidal ideation when he checks out. He denies any current active suicidal ideation, plan or intent. He denies homicidal ideation, auditory hallucinations, or visual hallucinations.     Update to medical history: Patient denies any recent ER visits, or new medical conditions. He had a hospitalization at the end of January 2024 due to infection.    Update to social history: He has an adult son and daughter. He has four grandsons. He raised his kids as a single father. He is dating right now. He has a dog named Thomas Francis that lives with him. He considers his dog as a good source of support. March is the anniversary of his nephew's suicide. He has associated guilt because his nephew had talked with him about some of his challenges. He was unable to talk with his nephew before he passed away.     Update to substance use history:   Social History     Tobacco Use    Smoking status: Never    Smokeless tobacco: Former     Types: Chew     Quit date: 1990   Vaping Use    Vaping status:  Never Used   Substance Use Topics    Alcohol use: Yes     Comment: social drinker    Drug use: Not Currently     Previous psychotropic medication trials/responses:  - Bupropion XL up to 300mg  po qday, 02/2021-present   - Citalopram up to 40mg  po qday, 05/2017-present   - Depakote up to 500mg  po bid, 11/2018-02/2021  - Escitalopram up to 5mg  po qday. 06/2017-11/2017  - Gabapentin up to 800mg  po tid, 05/2017-present   - Hydroxyzine up to 25mg  po qid prn anxiety, 09/2018-present   - Lorazepam up to 1mg  po tid prn anxiety , 06/2017-present   - Melatonin up to 10mg  po qhs, 05/2017-08/2020  - Trazodone up to 100mg  po qhs prn sleep and 25mg  po tid prn anxiety, 05/2017-11/2017  - Zolpidem up to 10mg  po qhs, 11/2017-present   - Patient also reports trials of alprazolam, fluvoxamine and melaril    Review of Systems   Constitutional:  Positive for fatigue.   Cardiovascular:  Positive for palpitations.   Psychiatric/Behavioral:  Positive for agitation, decreased concentration and dysphoric mood. Negative for suicidal ideas. The patient is nervous/anxious.      Objective:   acetaminophen (ACETAMINOPHEN EXTRA STRENGTH) 500 mg tablet Take two tablets by mouth every 6 hours as needed for Pain. Max of 4,000 mg of acetaminophen in 24 hours.    albuterol sulfate (PROAIR HFA) 90 mcg/actuation HFA aerosol inhaler INHALE 2 PUFFS BY MOUTH FOUR TIMES DAILY AS NEEDED FOR SHORTNESS OF BREATH    alfuzosin (UROXATRAL) 10 mg tablet Take one tablet by mouth daily.    alprostadil 20 mcg/mL soln IC inj(cmpd) Inject five mcg into base of penis as directed as Needed. Max 1 dose/ 24-48 hrs.    aspirin EC 81 mg tablet Take one tablet by mouth daily. Take with food.    buPROPion XL (WELLBUTRIN XL) 300 mg tablet TAKE ONE TABLET BY MOUTH EVERY MORNING. DO NOT CRUSH OR CHEW.    cholecalciferol (vitamin D3) (OPTIMAL D3) 50,000 units capsule TAKE ONE CAPSULE BY MOUTH WEEKLY ON FRIDAY    diclofenac (VOLTAREN) 1 % topical gel Apply four g topically to affected area four times daily. (Patient taking differently: Apply four g topically to affected area four times daily as needed.)    docusate (COLACE) 100 mg capsule Take one capsule by mouth twice daily.    duloxetine DR (CYMBALTA) 30 mg capsule Take three capsules by mouth every morning.    fluticasone propionate (FLONASE ALLERGY RELIEF) 50 mcg/actuation nasal spray, suspension Inhale one spray by mouth into the lungs daily.    fluticasone propionate (FLOVENT DISKUS) 100 mcg/actuation inhalation disk Inhale two puffs by mouth into the lungs twice daily.    FREESTYLE LIBRE 2 SENSOR sensor Use one each as directed before meals and at bedtime. Use as directed.  Indications: type 2 diabetes mellitus    furosemide (LASIX) 20 mg tablet Take one tablet by mouth as Needed. As needed once daily for peripheral edema    gabapentin (NEURONTIN) 800 mg tablet Take one tablet by mouth three times daily.    guaiFENesin LA (MUCINEX) 600 mg tablet Take one tablet by mouth twice daily. (Patient taking differently: Take one tablet by mouth twice daily as needed.)    hydrOXYzine HCL (ATARAX) 25 mg tablet TAKE ONE TABLET BY MOUTH FOUR TIMES DAILY AS NEEDED FOR ANXIETY    hyoscyamine sulfate (LEVSIN/SL) 0.125 mg sublingual tablet Place one tablet under tongue every 4 hours as needed for Cramps. Bladder  spasms. Max 1.5 mg/ day    ipratropium bromide (ATROVENT) 0.02 % nebulizer solution Inhale 2.5 mL by mouth into the lungs four times daily as needed for Shortness of Breath or Wheezing.    JARDIANCE 25 mg tablet Take one tablet by mouth daily.    lactobacillus rhamnosus (GG) (CULTURELLE) 10 billion cell cap Take one capsule by mouth daily with breakfast.    LEVEMIR FLEXPEN 100 unit/mL (3 mL) injection pen Inject sixty Units under the skin at bedtime daily.    levocetirizine (XYZAL) 5 mg tablet Take one tablet by mouth daily.    levothyroxine (SYNTHROID) 50 mcg tablet Take one tablet by mouth daily 30 minutes before breakfast.    lisinopriL (ZESTRIL) 2.5 mg tablet Hold this medication on discharge. Restart 09/10/21 with directions to take 1 tablet once daily.    loperamide HCl (IMODIUM PO) Take 1 tablet by mouth daily as needed for Diarrhea.    LORazepam (ATIVAN) 1 mg tablet Take one-half tablet by mouth twice daily as needed (severe anxiety or panic attacks). Please schedule follow-up at [phone]. 90 day warning - no additional refills will be provided until you are seen for follow-up.    meloxicam (MOBIC) 15 mg tablet Hold this medication on discharge. Restart 09/10/21 with directions to take 1 tablet once daily.    metFORMIN-XR (GLUCOPHAGE XR) 500 mg extended release tablet Take two tablets by mouth twice daily with meals.    metoprolol succinate XL (TOPROL XL) 25 mg extended release tablet TAKE ONE TABLET BY MOUTH EVERY NIGHT AT BEDTIME    Needle (Disp) 27 G 27 gauge x 1/2 ndle Use as directed.    ondansetron (ZOFRAN ODT) 4 mg rapid dissolve tablet as Needed.    OZEMPIC 1 mg/dose (4 mg/3 mL) injection PEN Inject one mg under the skin every 7 days.    phenazopyridine (PYRIDIUM) 200 mg tablet Take one tablet by mouth three times daily as needed for Pain. Max 3 days.    phentermine (ADIPEX-P) 37.5 mg capsule Take one capsule by mouth daily.    sennosides-docusate sodium (SENOKOT-S) 8.6/50 mg tablet Take one tablet by mouth twice daily.    syringe (disposable) 1 mL Use as directed.    tadalafiL (CIALIS) 10 mg tablet Take one tablet by mouth daily. prior to sexual activity    testosterone cypionate (DEPO-TESTOSTERONE) 200 mg/mL injection Inject 1 mL into the muscle every 14 days.    tiZANidine (ZANAFLEX) 4 mg tablet Take one tablet by mouth every 6 hours as needed.    vitamins, multiple tablet Take one tablet by mouth daily.    zolpidem (AMBIEN) 5 mg tablet TAKE ONE TABLET BY MOUTH AT BEDTIME AS NEEDED FOR SLEEP     There were no vitals filed for this visit. - telehealth  There is no height or weight on file to calculate BMI.     Most Recent Vitals (03/12/2022)  BP 115/76 (BP Location: Left arm, Patient Position: Lying) - Pulse 74 - Temp 36.5 ?C (97.7 ?F) (Oral) - Resp 16 - Ht 1.72 m (5' 7.72) - Wt 116.7 kg (257 lb 4.4 oz) - SpO2 98% - BMI 39.45 kg/m?     Mental Status Examination:  General/Constitutional: appears stated age, good hygiene, dressed in personal attire  Eye Contact: appropriate for telehealth appointment  Behavior: calm, cooperative, appropriate for conversation  Speech: normal volume, normal tone, good articulation  Motor: evidence of psychomotor agitation, akathisia, fidgety  Mood: not the best  Affect: dysthymic, congruent with stated mood,  appropriate for context of conversation  Thought Process: linear, goal-directed, logical, organized, relevant, coherent  Thought Content: reports passive suicidal ideation, but denies plan or intent, denies homicidal ideation, no evidence of delusions  Perception: denies auditory hallucinations, denies visual hallucinations, does not appear to be responding to internal stimuli  Associations: Intact  Insight: good  Judgement: good  Orientation: alert, grossly oriented, not formally assessed  Recent memory: Intact  Remote memory: Intact  Attention span and concentration: appropriate for conversation  Cognition: average  Language: fluent in [employer] of knowledge/vocabulary: average  Impulse control: good  Patient's ability to perform ADLs: intact without deficits  Risk of harm to self: low  Risk of harm to others: low    Screenings:  PHQ-9    08/29/21 1500    PHQ9 Questions and Score   Little interest or pleasure in doing things Almost all   Feeling down, depressed, or hopeless Almost all   Trouble falling or staying asleep, or sleeping too much Almost all   Feeling tired or having little energy Almost all   Poor appetite or overeating Over half   Feeling bad about yourself - or that you are a failure or have let yourself or your family down Almost all   Trouble concentrating on things, such as reading the newspaper or watching television Almost all   Moving or speaking so slowly that other people could have noticed? Or the opposite - being so fidgety or restless that you have been moving around a lot more than usual. Over half   Thoughts that you would be better off dead or hurting yourself in some way Several days   Patient Health Questionnaire-9 Score (!) 23   If you checked off any problems on this questionnaire so far,   How difficult have these problems made it for you to do your work, take care of things at home, or get along with other people? Very difficult     Assessment and Plan:  Thomas Francis has a history of MDD, GAD, PTSD, and BPD. Perpetuating factors include: chronic physical illness, chronic mental illness, work/life rigidity, negative/maladaptive thoughts, and avoidance behaviors. Psychiatric symptoms well controlled at today's encounter. Denies SI/HI and AVH and no other safety concerns. Pt reports no medication side effects. Inpatient psychiatric hospitalization is not deemed necessary at this time as the patient does not present a clear or imminent danger to self or others. Outpatient management is currently appropriate and least restrictive level of care. A number of protective factors were considered. Those with clinical relevance for this patient include: strong interpersonal bonds, safe and stable environment, help-seeking, identification of future goals, optimism, engagement in enjoyable activities, adequate medical and psychiatric support, and commitment and engagement in treatment. Purposed treatment plan includes: continue psychotropic medications without change, continue psychotherapy, and healthy lifestyle (diet and exercise). Patient seen and plan of care discussed with Dr. Geradine Girt. Return to clinic in 3 months.    Patient reports that he continues to struggle with anxiety and depression. He describes having increased interpersonal stress with his family members. He also recently lost his father at the end of August. Patient continues to struggle with low mood, fatigue, and avoidant behavior. He specifically asked this provider for stimulant medications and inquires if this would be a good solution to his problems. Discussed with the patient that this was not a good idea given his ongoing anxiety that is not well-controlled at this time. Additionally he does not have an  indication for a stimulant at this time. Encouraged the patient to reduce sedating medications such as hydroxyzine, lorazepam, and zolpidem. Patient became very upset and argumentative. He appeared agitated and restless. He told the provider that he had been on higher doses of lorazepam in the past and occasionally takes a higher dose than prescribed. Educated the patient on the risk of tolerance and dependence to benzodiazepines and encouraged to reduce to use in the future. Patient requested to read a text message from his daughter out loud despite being over his allotted appointment time. This message discussed his character as a good father and person. Patient stressed that he has been trying to work on his health and does not want to take these medications if he does not have to. Attempted verbal de-escalation with the patient and he apologized multiple times. Applauded on the patient's effort towards weight loss and improving mental health. However when the attending, Dr. Geradine Girt, joined the conversation, the patient referred to this provider as a student and reported that he was being talked down to regarding his use of benzodiazepines. He also claimed that this provider was refusing to answer his questions regarding medication management based on Genesight results. However patient was advised that [facility] does not have records of Genesight testing. He was encouraged to send results in MyChart or have his primary care provider faxed to this clinic. Again the patient apologized to this provider after the attending left the appointment. 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 Patient displayed significant emotional lability during the appointment likely related to personality traits. Will continue his current medications without changes. Do not recommend initiation of stimulants in the future. Would recommend taper of benzodiazepines when appropriate.    DSM-5 Diagnoses:  1. Chronic post-traumatic stress disorder (PTSD)    2. Generalized anxiety disorder    3. Major depressive disorder, recurrent, moderate (HCC)    4. Borderline personality disorder (HCC)    5. Type 2 diabetes mellitus with diabetic autonomic neuropathy, with long-term current use of insulin (HCC)    6. Class 3 severe obesity due to excess calories with serious comorbidity and body mass index (BMI) of 60.0 to 69.9 in adult (HCC)    7. Vitamin D deficiency, unspecified     8. Obstructive sleep apnea syndrome    9. Essential hypertension    10. RBBB    11. Stage 3a chronic kidney disease (HCC)    12. Other polyneuropathy    13. Other emphysema (HCC)       Psychotropic medications:  Continue duloxetine 60 mg daily, mood, trauma  Continue bupropion XL 300 mg every morning, mood  Continue hydroxyzine 25 mg up to four times daily as needed for mild to moderate anxiety  Continue lorazepam 0.5 mg twice daily as needed for severe anxiety  Notably the patient is taking zolpidem, oxycodone, and gabapentin prescribed by his primary care provider.    Labs:  None    Symptom Monitoring:      03/08/2019    10:00 AM   LASTPHQ9   PHQ-9 Score 23     Metabolic Monitoring:  Antidepressant Monitoring:  BP Readings from Last 2 Encounters:   09/03/21 131/72   08/29/21 (!) 143/85      Pulse Readings from Last 2 Encounters:   09/03/21 85   08/29/21 103      BMI Readings from Last 2 Encounters:   09/02/21 42.88 kg/m?   08/29/21 44.01 kg/m?      Controlled  substance monitoring:  PDMP reviewed. No evidence of unexpected refills.  BP Readings from Last 2 Encounters:   09/03/21 131/72   08/29/21 (!) 143/85      Pulse Readings from Last 2 Encounters:   09/03/21 85   08/29/21 103      Psychotherapy:  Continue psychotherapy with June Moore  [email or web address].[email or web address] is a helpful online resource for finding a therapist     Discussion:  The proposed treatment plan was discussed with the patient who was provided the opportunity to actively take part finalizing the current treatment plan.   Discussed risks, benefits and potential side effects of medications with patient and guardian as well as alternative treatments. After-Visit Summary included information on specific medications prescribed to the patient.  Discussed relevant black box warnings   Patient reported understanding of the risks, benefits and possible side effects and provided consent for treatment unless otherwise stated  Discussed contingency/emergency plans with the patient should there be concern for attempting suicide, committing self-harm or other potential injuries to self or others  These plans include:  Contacting the mental health clinic (calling, walk-in, etc.)  Calling 911 or calling the crisis line  Visiting the ER at any time     General policies:  Our clinic does not do long-term disability paperwork. On very rare circumstances we may do FMLA/short-term leave paperwork though we usually do not do this. Requests for filling out paperwork require an appointment for further discussion and review for paperwork to be filled out together.   Our clinic is dedicated to making sure your prescriptions are filled in a timely manner during regular business hours (8am-5pm).  If you need a medication refill, please call your pharmacy to request a refill.  Please make sure to request refills at least 72 hours in advance of your last dose.  Your pharmacy will reach out to [place] electronically, which is the preferred method.  Please try to refrain from paging the on-call psychiatrist regarding medication refills (including controlled substances), as you may not receive a refill or a full refill at that time.  For nonemergent concerns, you may reach out to the clinic via MyChart. You should receive a response within 72 business hours from our clinic staff/providers.    Communication via MyChart is appropriate for specific and straightforward concerns such as:   Clarification of medication instructions, questions about medications, reporting intolerable side effects    Refill requests    Review and discussion of lab results    Specific items we have discussed that I have asked for you to reach out to me about    MyChart is in inappropriate modality for:   Significant medication changes not previously discussed (starting or stopping medications)   If you require an immediate response from a health care professional, please do not call the on-call psychiatrist and call 911 directly.    Early refills will not be provided for controlled substances (benzodiazepines, stimulants) and it is important that you keep these medications safe because they will  not be refilled early for any reason. Ongoing prescriptions for controlled substances require follow up visits that are maintained every 3 months.   [facility] has a zero tolerance policy for discrimination, mistreatment or abusive behavior, including verbal abuse.       Important contact information:   [facility] Phone: [phone]  Local Warm Line (not for crisis):               [facility]            phone: [phone] or [phone] (toll free)               [place]                         4:00pm - 10:00pm every evening  Local Crisis Lines:               Name: [place] [place] 24/7                phone:    [phone]               Name: [place] 24/7                phone:    [phone]               Name: [place] 24/7            phone:    [phone]  National Suicide Prevention Lifelines & Textlines:                Dial or Text:       988                                               or chat: 988lifeline.org               or text:  [place] to [address]21               Option for Hearing Impaired: UVOZ 366 then 988 - Text Telephone   Local emergency services:                [facility]                     [place], [place] [address]                                 phone: [phone]  Or call 911     Note to patient: The [facility] makes medical notes like these available to patients in the interest of transparency. However, be advised this is a medical document. It is intended as peer to peer communication. It is written in medical language and may contain abbreviations or verbiage that are unfamiliar. It may appear blunt or direct. Medical documents are intended to carry relevant information, facts as evident, and the clinical opinion of the practitioner.     Mady Gemma, [place]

## 2022-05-13 ENCOUNTER — Ambulatory Visit: Admit: 2022-05-13 | Discharge: 2022-05-14 | Payer: MEDICARE

## 2022-05-13 ENCOUNTER — Ambulatory Visit: Admit: 2022-05-13 | Discharge: 2022-05-13 | Payer: MEDICARE

## 2022-05-13 ENCOUNTER — Encounter: Admit: 2022-05-13 | Discharge: 2022-05-13 | Payer: MEDICARE

## 2022-05-13 DIAGNOSIS — G4730 Sleep apnea, unspecified: Secondary | ICD-10-CM

## 2022-05-13 DIAGNOSIS — I4510 Unspecified right bundle-branch block: Secondary | ICD-10-CM

## 2022-05-13 DIAGNOSIS — I10 Essential (primary) hypertension: Secondary | ICD-10-CM

## 2022-05-13 DIAGNOSIS — J438 Other emphysema: Secondary | ICD-10-CM

## 2022-05-13 DIAGNOSIS — E559 Vitamin D deficiency, unspecified: Secondary | ICD-10-CM

## 2022-05-13 DIAGNOSIS — E039 Hypothyroidism, unspecified: Secondary | ICD-10-CM

## 2022-05-13 DIAGNOSIS — F332 Major depressive disorder, recurrent severe without psychotic features: Secondary | ICD-10-CM

## 2022-05-13 DIAGNOSIS — G6289 Other specified polyneuropathies: Secondary | ICD-10-CM

## 2022-05-13 DIAGNOSIS — N1831 Stage 3a chronic kidney disease (HCC): Secondary | ICD-10-CM

## 2022-05-13 DIAGNOSIS — G4733 Obstructive sleep apnea (adult) (pediatric): Secondary | ICD-10-CM

## 2022-05-13 DIAGNOSIS — E669 Obesity, unspecified: Secondary | ICD-10-CM

## 2022-05-13 DIAGNOSIS — G629 Polyneuropathy, unspecified: Secondary | ICD-10-CM

## 2022-05-13 DIAGNOSIS — F603 Borderline personality disorder: Secondary | ICD-10-CM

## 2022-05-13 DIAGNOSIS — J45909 Unspecified asthma, uncomplicated: Secondary | ICD-10-CM

## 2022-05-13 DIAGNOSIS — E1143 Type 2 diabetes mellitus with diabetic autonomic (poly)neuropathy: Secondary | ICD-10-CM

## 2022-05-13 DIAGNOSIS — J449 Chronic obstructive pulmonary disease, unspecified: Secondary | ICD-10-CM

## 2022-05-13 DIAGNOSIS — E119 Type 2 diabetes mellitus without complications: Secondary | ICD-10-CM

## 2022-05-13 DIAGNOSIS — F99 Mental disorder, not otherwise specified: Secondary | ICD-10-CM

## 2022-05-13 DIAGNOSIS — F439 Reaction to severe stress, unspecified: Secondary | ICD-10-CM

## 2022-05-13 DIAGNOSIS — R7989 Other specified abnormal findings of blood chemistry: Secondary | ICD-10-CM

## 2022-05-13 MED ORDER — HYDROXYZINE HCL 25 MG PO TAB
25 mg | ORAL_TABLET | Freq: Four times a day (QID) | ORAL | 3 refills | 30.00000 days | Status: AC | PRN
Start: 2022-05-13 — End: ?

## 2022-05-13 MED ORDER — LORAZEPAM 1 MG PO TAB
.5 mg | ORAL_TABLET | Freq: Two times a day (BID) | ORAL | 3 refills | 12.00000 days | Status: AC | PRN
Start: 2022-05-13 — End: ?

## 2022-05-13 MED ORDER — BUPROPION XL 300 MG PO TB24
300 mg | ORAL_TABLET | Freq: Every morning | ORAL | 3 refills | Status: AC
Start: 2022-05-13 — End: ?

## 2022-05-13 MED ORDER — DULOXETINE 60 MG PO CPDR
120 mg | ORAL_CAPSULE | Freq: Every day | ORAL | 3 refills | 60.00000 days | Status: AC
Start: 2022-05-13 — End: ?

## 2022-05-13 NOTE — Progress Notes
ATTENDING NOTE  I saw and evaluated Thomas Francis, discussed with Orest Dikes, DO and concur with the assessment and treatment plan. Patient is 52 y.o. 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 Pt reports increased symptoms of anxiety related to avoidance PTSD symptoms, some passive SI here and there without any plan or intent, denies AH/VH. Having significant low energy, discussed multiple sedating medications. Pt reports no medication side effects.    PLAN:  The following medication changes were made during this visit to better treat the above symptoms:  Continue Duloxetine 60mg  daily  Continue Wellbutrin XL to 300mg  PO Daily  Continue Hydroxyzine 25mg  PO QID PRN  Continue Ativan 0.5mg  PO BID PRN  Continue Ambien 10mg  PO QHS PRN  No labs needed     albuterol sulfate (PROAIR HFA) 90 mcg/actuation HFA aerosol inhaler INHALE 2 PUFFS BY MOUTH FOUR TIMES DAILY AS NEEDED FOR SHORTNESS OF BREATH    alfuzosin (UROXATRAL) 10 mg tablet Take one tablet by mouth daily.    alprostadil 20 mcg/mL soln IC inj(cmpd) Inject five mcg into base of penis as directed as Needed. Max 1 dose/ 24-48 hrs.    aspirin EC 81 mg tablet Take one tablet by mouth daily. Take with food.    buPROPion XL (WELLBUTRIN XL) 300 mg tablet TAKE ONE TABLET BY MOUTH EVERY MORNING. DO NOT CRUSH OR CHEW.    cholecalciferol (vitamin D3) (OPTIMAL D3) 50,000 units capsule TAKE ONE CAPSULE BY MOUTH WEEKLY ON FRIDAY    CLEARLAX 17 gram packet mix 1 packet AS DIRECTED and TAKE BY MOUTH TWICE DAILY FOR 3 DAYS    diclofenac (VOLTAREN) 1 % topical gel Apply four g topically to affected area four times daily. (Patient taking differently: Apply four g topically to affected area four times daily as needed.)    docusate (COLACE) 100 mg capsule Take one capsule by mouth twice daily.    duloxetine DR (CYMBALTA) 60 mg capsule TAKE TWO CAPSULES BY MOUTH EVERY DAY    fluticasone propionate (FLONASE ALLERGY RELIEF) 50 mcg/actuation nasal spray, suspension Inhale one spray by mouth into the lungs daily.    fluticasone propionate (FLOVENT DISKUS) 100 mcg/actuation inhalation disk Inhale two puffs by mouth into the lungs twice daily.    FREESTYLE LIBRE 2 SENSOR sensor Use one each as directed before meals and at bedtime. Use as directed.  Indications: type 2 diabetes mellitus    furosemide (LASIX) 20 mg tablet Take one tablet by mouth as Needed. As needed once daily for peripheral edema    gabapentin (NEURONTIN) 800 mg tablet Take one tablet by mouth three times daily.    guaiFENesin LA (MUCINEX) 600 mg tablet Take one tablet by mouth twice daily. (Patient taking differently: Take one tablet by mouth twice daily as needed.)    hydrOXYzine HCL (ATARAX) 25 mg tablet TAKE ONE TABLET BY MOUTH FOUR TIMES DAILY AS NEEDED FOR ANXIETY    ipratropium bromide (ATROVENT) 0.02 % nebulizer solution Inhale 2.5 mL by mouth into the lungs four times daily as needed for Shortness of Breath or Wheezing.    JARDIANCE 25 mg tablet Take one tablet by mouth daily.    lactobacillus rhamnosus (GG) (CULTURELLE) 10 billion cell cap Take one capsule by mouth daily with breakfast.    LEVEMIR FLEXPEN 100 unit/mL (3 mL) injection pen Inject sixty Units under the skin at bedtime daily.    levocetirizine (XYZAL) 5 mg tablet Take one tablet by mouth daily.  levothyroxine (SYNTHROID) 50 mcg tablet Take one tablet by mouth daily 30 minutes before breakfast.    lisinopril (ZESTRIL) 5 mg tablet Take one tablet by mouth daily.    LORazepam (ATIVAN) 1 mg tablet Take one-half tablet by mouth twice daily as needed (severe anxiety or panic attacks). Please schedule follow-up at [phone]. 90 day warning - no additional refills will be provided until you are seen for follow-up.    meloxicam (MOBIC) 15 mg tablet Hold this medication on discharge. Restart 09/10/21 with directions to take 1 tablet once daily.    metFORMIN-XR (GLUCOPHAGE XR) 500 mg extended release tablet Take two tablets by mouth twice daily with meals.    metoprolol succinate XL (TOPROL XL) 25 mg extended release tablet TAKE ONE TABLET BY MOUTH EVERY NIGHT AT BEDTIME    Needle (Disp) 27 G 27 gauge x 1/2 ndle Use as directed.    ondansetron (ZOFRAN ODT) 4 mg rapid dissolve tablet as Needed.    oxyCODONE 10 mg tablet TAKE ONE TABLET BY MOUTH TWICE DAILY for chronic back pain; use sparingly    OZEMPIC 1 mg/dose (4 mg/3 mL) injection PEN Inject one mg under the skin every 7 days.    sennosides-docusate sodium (SENOKOT-S) 8.6/50 mg tablet Take one tablet by mouth twice daily.    syringe (disposable) 1 mL Use as directed.    tadalafiL (CIALIS) 10 mg tablet Take one tablet by mouth daily. prior to sexual activity    testosterone cypionate (DEPO-TESTOSTERONE) 200 mg/mL injection Inject 1 mL into the muscle every 14 days.    tiZANidine (ZANAFLEX) 4 mg tablet Take one tablet by mouth every 6 hours as needed.    ULTICARE LOW DEAD SPACE SYRING 3 mL 22 x 1 1/2 syrg use AS DIRECTED to inject testosterone    vitamins, multiple tablet Take one tablet by mouth daily.    zolpidem (AMBIEN) 10 mg tablet TAKE ONE TABLET BY MOUTH EVERY NIGHT AT BEDTIME; TAKE SPARINGLY       Otila Kluver, DO  05/13/2022

## 2022-05-13 NOTE — Patient Instructions
Post Traumatic Stress Disorder (PTSD) Information and Evidenced Based Treatments:    National Center for PTSD  https://www.ptsd.va.gov/appvid/video/index.asp    Cognitive Processing Therapy  https://cptforptsd.com/    Youtube 14 minute video describing CPT for PTSD:  https://www.youtube.com/watch?v=BOZEMdwU9Ec    Whiteboard Talks on CPT: Videos to describe each assignment  https://cptforptsd.com/cpt-resources/    CPT Coach  An App you may want to utilize during treatment  To learn more watch this 4 minute Youtube video about the CPT Coach App  https://www.youtube.com/watch?v=CE2XGZZeidE    PTSD Coach App  App to assist in managing PTSD symptoms  To learn more watch 3 minute Youtube video:  https://www.youtube.com/watch?v=tPnpVp3xupg    Podcast describing CPT: https://www.thisamericanlife.org/682/ten-sessions    Local Warm Line (not for crisis):  [facility] Warm[facility]            phone: [phone] or [phone] (toll free)  [facility]                         4:00pm - 10:00pm every evening    Local Crisis Lines:      Name: [place] [place] [facility] 24/7                phone:    [phone]      Name: [facility] 24/7                phone:    [phone]      Name: [place] [facility] 24/7            phone:    [phone]    National Suicide Prevention Lifelines & Textlines:      Dial or Text:       988                                     or chat: 988lifeline.org     or text:  [facility] - Text HOPE to 20121       Option for Hearing Impaired:     Dial 711 then 988 - Text Telephone     Local emergency services:       [facility] - Emergency Department            [address]      [place], [place] [address]                                 phone: [phone]    Or call 911

## 2022-05-13 NOTE — Progress Notes
Patient identified with two identifiers. Patient consents to telehealth.

## 2022-05-14 DIAGNOSIS — F411 Generalized anxiety disorder: Secondary | ICD-10-CM

## 2022-05-14 DIAGNOSIS — F4312 Post-traumatic stress disorder, chronic: Secondary | ICD-10-CM

## 2022-05-14 DIAGNOSIS — F331 Major depressive disorder, recurrent, moderate: Secondary | ICD-10-CM

## 2022-05-20 ENCOUNTER — Encounter: Admit: 2022-05-20 | Discharge: 2022-05-20 | Payer: MEDICARE

## 2022-05-22 ENCOUNTER — Encounter: Admit: 2022-05-22 | Discharge: 2022-05-22 | Payer: MEDICARE

## 2022-05-22 DIAGNOSIS — F331 Major depressive disorder, recurrent, moderate: Secondary | ICD-10-CM

## 2022-05-22 MED ORDER — BUPROPION XL 300 MG PO TB24
300 mg | ORAL_TABLET | Freq: Every morning | ORAL | 0 refills
Start: 2022-05-22 — End: ?

## 2022-05-27 ENCOUNTER — Encounter: Admit: 2022-05-27 | Discharge: 2022-05-27 | Payer: MEDICARE

## 2022-05-29 ENCOUNTER — Encounter: Admit: 2022-05-29 | Discharge: 2022-05-29 | Payer: MEDICARE

## 2022-06-11 ENCOUNTER — Encounter: Admit: 2022-06-11 | Discharge: 2022-06-11 | Payer: MEDICARE

## 2022-06-11 DIAGNOSIS — F411 Generalized anxiety disorder: Secondary | ICD-10-CM

## 2022-06-11 MED ORDER — LORAZEPAM 1 MG PO TAB
ORAL_TABLET | 2 refills
Start: 2022-06-11 — End: ?

## 2022-06-25 ENCOUNTER — Encounter: Admit: 2022-06-25 | Discharge: 2022-06-25 | Payer: MEDICARE

## 2022-06-26 ENCOUNTER — Encounter: Admit: 2022-06-26 | Discharge: 2022-06-26 | Payer: MEDICARE

## 2022-07-29 ENCOUNTER — Encounter: Admit: 2022-07-29 | Discharge: 2022-07-29 | Payer: MEDICARE

## 2022-07-29 MED ORDER — METOPROLOL SUCCINATE 25 MG PO TB24
25 mg | ORAL_TABLET | Freq: Every evening | ORAL | 0 refills | 90.00000 days | Status: AC
Start: 2022-07-29 — End: ?

## 2022-07-29 MED ORDER — ALFUZOSIN 10 MG PO TB24
10 mg | ORAL_TABLET | Freq: Every day | ORAL | 3 refills
Start: 2022-07-29 — End: ?

## 2022-07-30 ENCOUNTER — Encounter: Admit: 2022-07-30 | Discharge: 2022-07-30 | Payer: MEDICARE

## 2022-08-27 ENCOUNTER — Encounter: Admit: 2022-08-27 | Discharge: 2022-08-27 | Payer: MEDICARE

## 2022-08-27 NOTE — Progress Notes
Thomas Manners. Francis, August 23, 1970 has an appointment with PA Clementeen Graham on 09/02/22.    Please send recent lab results for continuity of care.    Thank you,   Therma Lasure    Phone: [phone]  Fax: [phone]

## 2022-08-31 ENCOUNTER — Encounter: Admit: 2022-08-31 | Discharge: 2022-08-31 | Payer: MEDICARE

## 2022-09-07 ENCOUNTER — Encounter: Admit: 2022-09-07 | Discharge: 2022-09-07 | Payer: MEDICARE

## 2022-09-07 MED ORDER — ALFUZOSIN 10 MG PO TB24
10 mg | ORAL_TABLET | Freq: Every day | ORAL | 3 refills
Start: 2022-09-07 — End: ?

## 2022-09-10 ENCOUNTER — Encounter: Admit: 2022-09-10 | Discharge: 2022-09-10 | Payer: MEDICARE

## 2022-09-30 ENCOUNTER — Encounter: Admit: 2022-09-30 | Discharge: 2022-09-30 | Payer: MEDICARE

## 2022-09-30 DIAGNOSIS — F4312 Post-traumatic stress disorder, chronic: Secondary | ICD-10-CM

## 2022-09-30 DIAGNOSIS — F331 Major depressive disorder, recurrent, moderate: Secondary | ICD-10-CM

## 2022-09-30 DIAGNOSIS — F411 Generalized anxiety disorder: Secondary | ICD-10-CM

## 2022-09-30 MED ORDER — DULOXETINE 60 MG PO CPDR
ORAL_CAPSULE | ORAL | 3 refills | 60.00000 days | Status: AC
Start: 2022-09-30 — End: ?

## 2022-10-07 ENCOUNTER — Encounter: Admit: 2022-10-07 | Discharge: 2022-10-07 | Payer: MEDICARE

## 2022-10-07 DIAGNOSIS — F331 Major depressive disorder, recurrent, moderate: Secondary | ICD-10-CM

## 2022-10-07 DIAGNOSIS — F4312 Post-traumatic stress disorder, chronic: Secondary | ICD-10-CM

## 2022-10-07 DIAGNOSIS — F411 Generalized anxiety disorder: Secondary | ICD-10-CM

## 2022-10-07 MED ORDER — BUPROPION XL 300 MG PO TB24
ORAL_TABLET | 3 refills
Start: 2022-10-07 — End: ?

## 2022-10-07 MED ORDER — HYDROXYZINE HCL 25 MG PO TAB
25 mg | ORAL_TABLET | Freq: Four times a day (QID) | ORAL | 3 refills | PRN
Start: 2022-10-07 — End: ?

## 2022-10-30 ENCOUNTER — Encounter: Admit: 2022-10-30 | Discharge: 2022-10-30 | Payer: MEDICARE

## 2022-10-30 DIAGNOSIS — F411 Generalized anxiety disorder: Secondary | ICD-10-CM

## 2022-10-30 MED ORDER — LORAZEPAM 1 MG PO TAB
ORAL_TABLET | 3 refills
Start: 2022-10-30 — End: ?

## 2022-11-05 ENCOUNTER — Encounter: Admit: 2022-11-05 | Discharge: 2022-11-05 | Payer: MEDICARE

## 2022-11-05 MED ORDER — METOPROLOL SUCCINATE 25 MG PO TB24
25 mg | ORAL_TABLET | Freq: Every evening | ORAL | 0 refills
Start: 2022-11-05 — End: ?

## 2022-11-06 ENCOUNTER — Encounter: Admit: 2022-09-02 | Discharge: 2022-09-02 | Payer: MEDICARE

## 2022-11-07 ENCOUNTER — Encounter: Admit: 2022-11-07 | Discharge: 2022-11-07 | Payer: MEDICARE

## 2022-11-07 MED ORDER — METOPROLOL SUCCINATE 25 MG PO TB24
25 mg | ORAL_TABLET | Freq: Every evening | ORAL | 0 refills
Start: 2022-11-07 — End: ?

## 2023-01-26 ENCOUNTER — Encounter: Admit: 2023-01-26 | Discharge: 2023-01-26 | Payer: MEDICARE

## 2023-01-28 ENCOUNTER — Encounter: Admit: 2023-01-28 | Discharge: 2023-01-28 | Payer: MEDICARE

## 2023-02-03 ENCOUNTER — Encounter: Admit: 2023-02-03 | Discharge: 2023-02-03 | Payer: MEDICARE

## 2023-02-26 ENCOUNTER — Encounter: Admit: 2023-02-26 | Discharge: 2023-02-26 | Payer: MEDICARE

## 2023-03-02 ENCOUNTER — Encounter: Admit: 2023-03-02 | Discharge: 2023-03-02 | Payer: MEDICARE

## 2023-03-04 ENCOUNTER — Encounter: Admit: 2023-03-04 | Discharge: 2023-03-04 | Payer: MEDICARE

## 2023-04-13 ENCOUNTER — Encounter: Admit: 2023-04-13 | Discharge: 2023-04-13 | Payer: MEDICARE

## 2023-06-26 ENCOUNTER — Encounter: Admit: 2023-06-26 | Discharge: 2023-06-26 | Payer: MEDICARE

## 2023-06-28 ENCOUNTER — Encounter: Admit: 2023-06-28 | Discharge: 2023-06-28 | Payer: MEDICARE

## 2023-07-26 ENCOUNTER — Encounter: Admit: 2023-07-26 | Discharge: 2023-07-26 | Payer: MEDICARE

## 2023-12-22 ENCOUNTER — Encounter: Admit: 2023-12-22 | Discharge: 2023-12-22 | Payer: MEDICARE
